# Patient Record
Sex: Male | Born: 1955 | Race: White | Hispanic: No | Smoking: Former smoker
Health system: Southern US, Community
[De-identification: ages and names within clinical notes are randomized; demographics above are authoritative.]

## PROBLEM LIST (undated history)

## (undated) DIAGNOSIS — E785 Hyperlipidemia, unspecified: Secondary | ICD-10-CM

## (undated) DIAGNOSIS — T8859XA Other complications of anesthesia, initial encounter: Secondary | ICD-10-CM

## (undated) DIAGNOSIS — I4891 Unspecified atrial fibrillation: Secondary | ICD-10-CM

## (undated) DIAGNOSIS — F419 Anxiety disorder, unspecified: Secondary | ICD-10-CM

## (undated) DIAGNOSIS — Z9989 Dependence on other enabling machines and devices: Secondary | ICD-10-CM

## (undated) DIAGNOSIS — S83209A Unspecified tear of unspecified meniscus, current injury, unspecified knee, initial encounter: Secondary | ICD-10-CM

## (undated) DIAGNOSIS — J439 Emphysema, unspecified: Secondary | ICD-10-CM

## (undated) DIAGNOSIS — G4733 Obstructive sleep apnea (adult) (pediatric): Secondary | ICD-10-CM

## (undated) DIAGNOSIS — K859 Acute pancreatitis without necrosis or infection, unspecified: Secondary | ICD-10-CM

## (undated) DIAGNOSIS — G5760 Lesion of plantar nerve, unspecified lower limb: Secondary | ICD-10-CM

## (undated) DIAGNOSIS — Z8709 Personal history of other diseases of the respiratory system: Secondary | ICD-10-CM

## (undated) DIAGNOSIS — M1990 Unspecified osteoarthritis, unspecified site: Secondary | ICD-10-CM

## (undated) DIAGNOSIS — K469 Unspecified abdominal hernia without obstruction or gangrene: Secondary | ICD-10-CM

## (undated) DIAGNOSIS — K219 Gastro-esophageal reflux disease without esophagitis: Secondary | ICD-10-CM

## (undated) DIAGNOSIS — F32A Depression, unspecified: Secondary | ICD-10-CM

## (undated) DIAGNOSIS — I10 Essential (primary) hypertension: Secondary | ICD-10-CM

## (undated) DIAGNOSIS — J45909 Unspecified asthma, uncomplicated: Secondary | ICD-10-CM

## (undated) DIAGNOSIS — Z87442 Personal history of urinary calculi: Secondary | ICD-10-CM

## (undated) DIAGNOSIS — F329 Major depressive disorder, single episode, unspecified: Secondary | ICD-10-CM

## (undated) HISTORY — DX: Unspecified asthma, uncomplicated: J45.909

## (undated) HISTORY — PX: HERNIA REPAIR: SHX51

## (undated) HISTORY — DX: Unspecified atrial fibrillation: I48.91

## (undated) HISTORY — DX: Obstructive sleep apnea (adult) (pediatric): Z99.89

## (undated) HISTORY — DX: Essential (primary) hypertension: I10

## (undated) HISTORY — DX: Anxiety disorder, unspecified: F41.9

## (undated) HISTORY — PX: COLONOSCOPY: SHX174

## (undated) HISTORY — DX: Unspecified osteoarthritis, unspecified site: M19.90

## (undated) HISTORY — DX: Emphysema, unspecified: J43.9

## (undated) HISTORY — DX: Personal history of other diseases of the respiratory system: Z87.09

## (undated) HISTORY — DX: Unspecified abdominal hernia without obstruction or gangrene: K46.9

## (undated) HISTORY — DX: Lesion of plantar nerve, unspecified lower limb: G57.60

## (undated) HISTORY — DX: Obstructive sleep apnea (adult) (pediatric): G47.33

## (undated) HISTORY — DX: Unspecified tear of unspecified meniscus, current injury, unspecified knee, initial encounter: S83.209A

## (undated) HISTORY — DX: Depression, unspecified: F32.A

## (undated) HISTORY — DX: Gastro-esophageal reflux disease without esophagitis: K21.9

## (undated) HISTORY — DX: Major depressive disorder, single episode, unspecified: F32.9

## (undated) HISTORY — DX: Hyperlipidemia, unspecified: E78.5

## (undated) HISTORY — DX: Acute pancreatitis without necrosis or infection, unspecified: K85.90

---

## 1989-04-10 HISTORY — PX: BONE CYST EXCISION: SHX376

## 1998-08-01 ENCOUNTER — Encounter: Payer: Self-pay | Admitting: Emergency Medicine

## 1998-08-01 ENCOUNTER — Emergency Department (HOSPITAL_COMMUNITY): Admission: EM | Admit: 1998-08-01 | Discharge: 1998-08-01 | Payer: Self-pay | Admitting: Emergency Medicine

## 2000-07-10 ENCOUNTER — Encounter: Payer: Self-pay | Admitting: Internal Medicine

## 2000-07-10 ENCOUNTER — Encounter: Admission: RE | Admit: 2000-07-10 | Discharge: 2000-07-10 | Payer: Self-pay | Admitting: Internal Medicine

## 2004-02-03 ENCOUNTER — Encounter: Admission: RE | Admit: 2004-02-03 | Discharge: 2004-02-03 | Payer: Self-pay | Admitting: Internal Medicine

## 2004-08-25 ENCOUNTER — Ambulatory Visit: Payer: Self-pay | Admitting: Pulmonary Disease

## 2008-09-01 ENCOUNTER — Encounter: Admission: RE | Admit: 2008-09-01 | Discharge: 2008-09-01 | Payer: Self-pay | Admitting: Internal Medicine

## 2011-03-31 ENCOUNTER — Encounter (INDEPENDENT_AMBULATORY_CARE_PROVIDER_SITE_OTHER): Payer: Self-pay | Admitting: Surgery

## 2011-04-05 ENCOUNTER — Ambulatory Visit (INDEPENDENT_AMBULATORY_CARE_PROVIDER_SITE_OTHER): Payer: 59 | Admitting: Surgery

## 2011-04-05 ENCOUNTER — Encounter (INDEPENDENT_AMBULATORY_CARE_PROVIDER_SITE_OTHER): Payer: Self-pay | Admitting: Surgery

## 2011-04-05 DIAGNOSIS — K4090 Unilateral inguinal hernia, without obstruction or gangrene, not specified as recurrent: Secondary | ICD-10-CM

## 2011-04-05 NOTE — Progress Notes (Signed)
Chief Complaint  Patient presents with  . New Evaluation    eval RIH - referral from Dr. Juline Patch    HISTORY: Patient is a 55 year old white male referred by his primary care physician with symptomatic right inguinal hernia. This has been present for several years. It was originally diagnosed by his urologist. Over time it has become larger and more symptomatic. Patient experiences pain with physical activity. He now notes a visible bulge. It has always been reducible. Patient has had no prior abdominal surgery.   Past Medical History  Diagnosis Date  . Obstructive sleep apnea on CPAP   . Pancreatitis   . Morton's neuroma   . Anxiety and depression   . GERD (gastroesophageal reflux disease)   . Torn meniscus     Left  . OA (osteoarthritis)     neck  . Hernia     right inguinal  . Hypertension   . History of bronchitis      Current Outpatient Prescriptions  Medication Sig Dispense Refill  . ASTAXANTHIN PO Take 6 mg by mouth daily.        Marland Kitchen HYDROcodone-acetaminophen (NORCO) 5-325 MG per tablet Take 1 tablet by mouth every 6 (six) hours as needed.        . Inositol 500 MG TABS Take by mouth.        . Kava, Piper methysticum, (KAVA KAVA PO) Take by mouth as directed.        . Multiple Vitamins-Minerals (MULTIVITAMIN WITH MINERALS) tablet Take 1 tablet by mouth daily.        Marland Kitchen omeprazole (PRILOSEC) 20 MG capsule Take 20 mg by mouth daily.        . sildenafil (VIAGRA) 100 MG tablet Take 100 mg by mouth daily as needed.        Marland Kitchen Specialty Vitamins Products (MAGNESIUM, AMINO ACID CHELATE,) 133 MG tablet Take 1 tablet by mouth 2 (two) times daily.        . vitamin B-12 (CYANOCOBALAMIN) 100 MCG tablet Take 50 mcg by mouth daily.        Marland Kitchen VITAMIN D, CHOLECALCIFEROL, PO Take 4,000 Units by mouth daily.           Allergies  Allergen Reactions  . Aspirin     Upsets GI  . Nsaids     Upsets GI     Family History  Problem Relation Age of Onset  . Dementia    . Hypertension  Mother   . Hypertension Sister   . Hypertension Brother      History   Social History  . Marital Status: Divorced    Spouse Name: N/A    Number of Children: N/A  . Years of Education: N/A   Social History Main Topics  . Smoking status: Current Everyday Smoker -- 1.5 packs/day  . Smokeless tobacco: None  . Alcohol Use: Yes  . Drug Use: No  . Sexually Active:    Other Topics Concern  . None   Social History Narrative  . None     REVIEW OF SYSTEMS - PERTINENT POSITIVES ONLY: No signs or symptoms of obstruction. It is reducible. Painful with physical activity. Painful with cough.   EXAM: Filed Vitals:   04/05/11 0846  BP: 168/102  Pulse: 104  Temp: 99.2 F (37.3 C)  Resp: 12    HEENT: normocephalic; pupils equal and reactive; sclerae clear; dentition good; mucous membranes moist NECK:  No mass or nodules; symmetric on extension; no palpable anterior or  posterior cervical lymphadenopathy; no supraclavicular masses; no tenderness CHEST: clear to auscultation bilaterally without rales, rhonchi, or wheezes CARDIAC: regular rate and rhythm without significant murmur; peripheral pulses are full ABD:  No surgical wounds; no umbilical hernia GU:  Normal male; no mass; obvious bulge right groin; no hernia on left with cough; tender hernia on right which augments with cough and valsalva EXT:  non-tender without edema; no deformity NEURO: no gross focal deficits; no sign of tremor   LABORATORY RESULTS: See E-Chart for most recent results   RADIOLOGY RESULTS: See E-Chart or I-Site for most recent results   IMPRESSION: Right inguinal hernia, reducible, symptomatic   PLAN: The patient and I had a lengthy discussion regarding right inguinal hernia repair with mesh. We discussed the benefits of open repair versus laparoscopic repair. I provided him with written literature regarding hernias. We discussed outpatient surgery. We discussed the restrictions on his activities  following the procedure. We discussed potential complications. He understands and wishes to proceed. We will make arrangements for surgery in the near future at a time convenient for the patient.  The risks and benefits of the procedure have been discussed at length with the patient.  The patient understands the proposed procedure, potential alternative treatments, and the course of recovery to be expected.  All of the patient's questions have been answered at this time.  The patient wishes to proceed with surgery and will schedule a date for their procedure through our office staff.  Velora Heckler, MD, FACS General & Endocrine Surgery [facility]  Visit Diagnoses: 1. Inguinal hernia unilateral, non-recurrent, right    Primary Care Physician: Juline Patch, MD, MD

## 2011-04-05 NOTE — Patient Instructions (Signed)

## 2011-04-14 DIAGNOSIS — K4090 Unilateral inguinal hernia, without obstruction or gangrene, not specified as recurrent: Secondary | ICD-10-CM

## 2011-04-14 HISTORY — PX: HERNIA REPAIR: SHX51

## 2011-04-17 ENCOUNTER — Telehealth (INDEPENDENT_AMBULATORY_CARE_PROVIDER_SITE_OTHER): Payer: Self-pay | Admitting: General Surgery

## 2011-04-17 NOTE — Telephone Encounter (Signed)
Pt called and stated that the Percocet was making him sick that it was to strong. I called in the Vicodin pro call into the [facility] [phone]

## 2011-04-18 ENCOUNTER — Telehealth (INDEPENDENT_AMBULATORY_CARE_PROVIDER_SITE_OTHER): Payer: Self-pay | Admitting: Surgery

## 2011-04-26 ENCOUNTER — Telehealth (INDEPENDENT_AMBULATORY_CARE_PROVIDER_SITE_OTHER): Payer: Self-pay

## 2011-04-26 NOTE — Telephone Encounter (Signed)
Pt called stating he was having tingling around his incision. He wants to know if this is normal. No swelling,redness or drainage. Pt states wound is closed and appears to be healing well. Pt advised due to  incision there may be some stinging and tingling due to nerves that are involved in the surgery. Pt advised over time this should resolve. Pt to call if incision shows any signs of infection or if symptom increases or becomes more painful.

## 2011-05-03 ENCOUNTER — Ambulatory Visit (INDEPENDENT_AMBULATORY_CARE_PROVIDER_SITE_OTHER): Payer: Self-pay | Admitting: Surgery

## 2011-05-04 ENCOUNTER — Encounter (INDEPENDENT_AMBULATORY_CARE_PROVIDER_SITE_OTHER): Payer: Self-pay | Admitting: Surgery

## 2011-05-04 ENCOUNTER — Ambulatory Visit (INDEPENDENT_AMBULATORY_CARE_PROVIDER_SITE_OTHER): Payer: 59 | Admitting: Surgery

## 2011-05-04 VITALS — BP 142/98 | HR 70 | Temp 97.6°F | Resp 16 | Ht 71.5 in | Wt 176.2 lb

## 2011-05-04 DIAGNOSIS — K4090 Unilateral inguinal hernia, without obstruction or gangrene, not specified as recurrent: Secondary | ICD-10-CM

## 2011-05-04 NOTE — Patient Instructions (Signed)
  COCOA BUTTER & VITAMIN E CREAM  (Palmer's or other brand)  Apply cocoa butter/vitamin E cream to your incision 2 - 3 times daily.  Massage cream into incision for one minute with each application.  Use sunscreen (50 SPF or higher) for first 6 months after surgery if area is exposed to sun.  You may substitute Mederma or other scar reducing creams as desired.   

## 2011-05-04 NOTE — Progress Notes (Signed)
Visit Diagnoses: 1. Inguinal hernia unilateral, non-recurrent, right    HISTORY: Patient returns for her first postoperative visit having undergone right inguinal hernia repair with mesh on April 14, 2011. Postoperatively the patient has had moderate pain in the distribution of the ilioinguinal nerve. He presents today for evaluation.  EXAM: Skin incision is well-healed. Mild to moderate soft tissue swelling. No sign of stroma. No sign of hematoma. No sign of infection. With cough and Valsalva there is no sign of recurrent hernia.  IMPRESSION: Status post right inguinal hernia repair with mesh, postoperative pain in the distribution of the ilioinguinal nerve  PLAN: The patient will continue to be careful with lifting. He is limited to 25 pounds. He will use ice packs in the evening and elevate his legs. He will return to see me for a wound check in 4-5 weeks.  Velora Heckler, MD, FACS General & Endocrine Surgery [facility]

## 2011-05-05 ENCOUNTER — Encounter (INDEPENDENT_AMBULATORY_CARE_PROVIDER_SITE_OTHER): Payer: 59 | Admitting: Surgery

## 2011-05-09 ENCOUNTER — Encounter (INDEPENDENT_AMBULATORY_CARE_PROVIDER_SITE_OTHER): Payer: 59 | Admitting: Surgery

## 2011-05-10 ENCOUNTER — Telehealth (INDEPENDENT_AMBULATORY_CARE_PROVIDER_SITE_OTHER): Payer: Self-pay

## 2011-05-10 NOTE — Telephone Encounter (Signed)
[facility] called requesting authorization for a refill of Norco 5/325mg , 1 po  q6hrs prn pain, #30 w/0 refill, post surgical pain medication refill protocol.

## 2011-05-11 ENCOUNTER — Encounter (INDEPENDENT_AMBULATORY_CARE_PROVIDER_SITE_OTHER): Payer: 59 | Admitting: Surgery

## 2011-06-01 ENCOUNTER — Encounter (INDEPENDENT_AMBULATORY_CARE_PROVIDER_SITE_OTHER): Payer: Self-pay | Admitting: Surgery

## 2011-06-01 ENCOUNTER — Ambulatory Visit (INDEPENDENT_AMBULATORY_CARE_PROVIDER_SITE_OTHER): Payer: 59 | Admitting: Surgery

## 2011-06-01 VITALS — BP 132/90 | HR 100 | Temp 97.4°F | Resp 16 | Wt 179.2 lb

## 2011-06-01 DIAGNOSIS — K4090 Unilateral inguinal hernia, without obstruction or gangrene, not specified as recurrent: Secondary | ICD-10-CM

## 2011-06-01 NOTE — Patient Instructions (Signed)
  COCOA BUTTER & VITAMIN E CREAM  (Palmer's or other brand)  Apply cocoa butter/vitamin E cream to your incision 2 - 3 times daily.  Massage cream into incision for one minute with each application.  Use sunscreen (50 SPF or higher) for first 6 months after surgery if area is exposed to sun.  You may substitute Mederma or other scar reducing creams as desired.   

## 2011-06-01 NOTE — Progress Notes (Signed)
Visit Diagnoses: 1. Inguinal hernia unilateral, non-recurrent, right     HISTORY: Patient returns for post op visit.  Pain has completely resolved.  EXAM: Wound is well healed.  Minimal STS.  No sign of recurrence.  IMPRESSION: Status post RIH with mesh  PLAN: Resume normal activities.  Return as needed.  Velora Heckler, MD, FACS General & Endocrine Surgery [facility]

## 2011-06-09 ENCOUNTER — Encounter (INDEPENDENT_AMBULATORY_CARE_PROVIDER_SITE_OTHER): Payer: Self-pay | Admitting: Surgery

## 2014-02-09 ENCOUNTER — Other Ambulatory Visit: Payer: Self-pay | Admitting: Internal Medicine

## 2014-02-09 DIAGNOSIS — R1310 Dysphagia, unspecified: Secondary | ICD-10-CM

## 2014-02-16 ENCOUNTER — Ambulatory Visit
Admission: RE | Admit: 2014-02-16 | Discharge: 2014-02-16 | Disposition: A | Discharge status: Home / Self Care | Payer: 59 | Source: Ambulatory Visit | Attending: Internal Medicine | Admitting: Internal Medicine

## 2014-02-16 DIAGNOSIS — R1310 Dysphagia, unspecified: Secondary | ICD-10-CM

## 2015-10-18 DIAGNOSIS — M5416 Radiculopathy, lumbar region: Secondary | ICD-10-CM | POA: Diagnosis not present

## 2016-02-14 DIAGNOSIS — Z0000 Encounter for general adult medical examination without abnormal findings: Secondary | ICD-10-CM | POA: Diagnosis not present

## 2016-02-14 DIAGNOSIS — Z125 Encounter for screening for malignant neoplasm of prostate: Secondary | ICD-10-CM | POA: Diagnosis not present

## 2016-03-20 DIAGNOSIS — Z1212 Encounter for screening for malignant neoplasm of rectum: Secondary | ICD-10-CM | POA: Diagnosis not present

## 2016-04-14 DIAGNOSIS — M5416 Radiculopathy, lumbar region: Secondary | ICD-10-CM | POA: Diagnosis not present

## 2016-04-14 DIAGNOSIS — M7062 Trochanteric bursitis, left hip: Secondary | ICD-10-CM | POA: Diagnosis not present

## 2016-04-17 DIAGNOSIS — N432 Other hydrocele: Secondary | ICD-10-CM | POA: Diagnosis not present

## 2016-04-17 DIAGNOSIS — N503 Cyst of epididymis: Secondary | ICD-10-CM | POA: Diagnosis not present

## 2016-05-01 DIAGNOSIS — M6281 Muscle weakness (generalized): Secondary | ICD-10-CM | POA: Diagnosis not present

## 2016-05-01 DIAGNOSIS — R278 Other lack of coordination: Secondary | ICD-10-CM | POA: Diagnosis not present

## 2016-05-01 DIAGNOSIS — R1031 Right lower quadrant pain: Secondary | ICD-10-CM | POA: Diagnosis not present

## 2016-05-01 DIAGNOSIS — M62838 Other muscle spasm: Secondary | ICD-10-CM | POA: Diagnosis not present

## 2016-05-02 DIAGNOSIS — M5416 Radiculopathy, lumbar region: Secondary | ICD-10-CM | POA: Diagnosis not present

## 2016-05-09 DIAGNOSIS — M545 Low back pain: Secondary | ICD-10-CM | POA: Diagnosis not present

## 2016-05-09 DIAGNOSIS — M62838 Other muscle spasm: Secondary | ICD-10-CM | POA: Diagnosis not present

## 2016-05-09 DIAGNOSIS — M6281 Muscle weakness (generalized): Secondary | ICD-10-CM | POA: Diagnosis not present

## 2016-05-09 DIAGNOSIS — R3912 Poor urinary stream: Secondary | ICD-10-CM | POA: Diagnosis not present

## 2016-05-16 DIAGNOSIS — R3915 Urgency of urination: Secondary | ICD-10-CM | POA: Diagnosis not present

## 2016-05-16 DIAGNOSIS — M6281 Muscle weakness (generalized): Secondary | ICD-10-CM | POA: Diagnosis not present

## 2016-05-16 DIAGNOSIS — R3912 Poor urinary stream: Secondary | ICD-10-CM | POA: Diagnosis not present

## 2016-05-16 DIAGNOSIS — M62838 Other muscle spasm: Secondary | ICD-10-CM | POA: Diagnosis not present

## 2016-07-24 DIAGNOSIS — M4722 Other spondylosis with radiculopathy, cervical region: Secondary | ICD-10-CM | POA: Diagnosis not present

## 2016-07-24 DIAGNOSIS — M65342 Trigger finger, left ring finger: Secondary | ICD-10-CM | POA: Diagnosis not present

## 2016-09-11 DIAGNOSIS — Z6828 Body mass index (BMI) 28.0-28.9, adult: Secondary | ICD-10-CM | POA: Diagnosis not present

## 2016-09-11 DIAGNOSIS — M519 Unspecified thoracic, thoracolumbar and lumbosacral intervertebral disc disorder: Secondary | ICD-10-CM | POA: Diagnosis not present

## 2016-10-19 DIAGNOSIS — M4120 Other idiopathic scoliosis, site unspecified: Secondary | ICD-10-CM | POA: Diagnosis not present

## 2016-10-19 DIAGNOSIS — M545 Low back pain: Secondary | ICD-10-CM | POA: Diagnosis not present

## 2016-10-19 DIAGNOSIS — M542 Cervicalgia: Secondary | ICD-10-CM | POA: Diagnosis not present

## 2016-10-31 DIAGNOSIS — M65342 Trigger finger, left ring finger: Secondary | ICD-10-CM | POA: Diagnosis not present

## 2016-10-31 DIAGNOSIS — M65341 Trigger finger, right ring finger: Secondary | ICD-10-CM | POA: Diagnosis not present

## 2016-11-22 ENCOUNTER — Emergency Department (HOSPITAL_COMMUNITY)
Admission: EM | Admit: 2016-11-22 | Discharge: 2016-11-23 | Disposition: A | Discharge status: Home / Self Care | Payer: BLUE CROSS/BLUE SHIELD | Attending: Emergency Medicine | Admitting: Emergency Medicine

## 2016-11-22 ENCOUNTER — Encounter (HOSPITAL_COMMUNITY): Payer: Self-pay | Admitting: Emergency Medicine

## 2016-11-22 DIAGNOSIS — F329 Major depressive disorder, single episode, unspecified: Secondary | ICD-10-CM | POA: Insufficient documentation

## 2016-11-22 DIAGNOSIS — Z79899 Other long term (current) drug therapy: Secondary | ICD-10-CM | POA: Diagnosis not present

## 2016-11-22 DIAGNOSIS — F17200 Nicotine dependence, unspecified, uncomplicated: Secondary | ICD-10-CM | POA: Insufficient documentation

## 2016-11-22 DIAGNOSIS — I480 Paroxysmal atrial fibrillation: Secondary | ICD-10-CM

## 2016-11-22 DIAGNOSIS — R079 Chest pain, unspecified: Secondary | ICD-10-CM | POA: Diagnosis not present

## 2016-11-22 DIAGNOSIS — F419 Anxiety disorder, unspecified: Secondary | ICD-10-CM | POA: Diagnosis not present

## 2016-11-22 DIAGNOSIS — R000 Tachycardia, unspecified: Secondary | ICD-10-CM | POA: Diagnosis not present

## 2016-11-22 DIAGNOSIS — I10 Essential (primary) hypertension: Secondary | ICD-10-CM | POA: Diagnosis not present

## 2016-11-22 NOTE — ED Triage Notes (Signed)
Per EMS pt from home pulse 190 total 6mg  then 12 mg adenosine, pt vagaled with no improvement, pt still 190, 1 hr ago pt was sitting on couch and pt felt heaviness in chest and felt heart racing,

## 2016-11-23 LAB — CBC WITH DIFFERENTIAL/PLATELET
BASOS ABS: 0 10*3/uL (ref 0.0–0.1)
BASOS PCT: 0 %
EOS PCT: 4 %
Eosinophils Absolute: 0.3 10*3/uL (ref 0.0–0.7)
HEMATOCRIT: 41.4 % (ref 39.0–52.0)
Hemoglobin: 13.9 g/dL (ref 13.0–17.0)
Lymphocytes Relative: 27 %
Lymphs Abs: 1.9 10*3/uL (ref 0.7–4.0)
MCH: 30.1 pg (ref 26.0–34.0)
MCHC: 33.6 g/dL (ref 30.0–36.0)
MCV: 89.6 fL (ref 78.0–100.0)
MONO ABS: 1 10*3/uL (ref 0.1–1.0)
MONOS PCT: 14 %
NEUTROS ABS: 3.9 10*3/uL (ref 1.7–7.7)
Neutrophils Relative %: 55 %
Platelets: 185 10*3/uL (ref 150–400)
RBC: 4.62 MIL/uL (ref 4.22–5.81)
RDW: 12.6 % (ref 11.5–15.5)
WBC: 7 10*3/uL (ref 4.0–10.5)

## 2016-11-23 LAB — BASIC METABOLIC PANEL
Anion gap: 9 (ref 5–15)
BUN: 17 mg/dL (ref 6–20)
CALCIUM: 9 mg/dL (ref 8.9–10.3)
CO2: 26 mmol/L (ref 22–32)
CREATININE: 0.95 mg/dL (ref 0.61–1.24)
Chloride: 108 mmol/L (ref 101–111)
GFR calc Af Amer: >60 mL/min (ref >60)
GLUCOSE: 106 mg/dL — AB (ref 65–99)
Potassium: 4.4 mmol/L (ref 3.5–5.1)
Sodium: 143 mmol/L (ref 135–145)

## 2016-11-23 LAB — TSH: TSH: 2.375 u[IU]/mL (ref 0.350–4.500)

## 2016-11-23 LAB — T4, FREE: Free T4: 0.86 ng/dL (ref 0.61–1.12)

## 2016-11-23 MED ORDER — APIXABAN 5 MG PO TABS
5.0000 mg | ORAL_TABLET | Freq: Two times a day (BID) | ORAL | Status: DC
  Filled 2016-11-23: qty 1

## 2016-11-23 MED ORDER — PROPOFOL 10 MG/ML IV BOLUS
0.5000 mg/kg | Freq: Once | INTRAVENOUS | Status: AC
  Administered 2016-11-23: 40.8 mg via INTRAVENOUS
  Filled 2016-11-23: qty 20

## 2016-11-23 MED ORDER — APIXABAN 5 MG PO TABS: 5.0000 mg | ORAL_TABLET | Freq: Two times a day (BID) | ORAL | 0 refills | Status: DC

## 2016-11-23 NOTE — ED Notes (Signed)
Pt stable and states understanding discharge instructions, downtime in process so no signature, pt given eliquis at 0130

## 2016-11-23 NOTE — Sedation Documentation (Signed)
120J shock delivered

## 2016-11-23 NOTE — ED Provider Notes (Addendum)
[facility] DEPT Provider Note   CSN: [id number] Arrival date & time: 11/22/16  2345     History   Chief Complaint Chief Complaint  Patient presents with  . Chest Pain    HPI John [name] is a 61 y.o. male.  The history is provided by the patient.  he had sudden onset about one hour ago of feeling like he had some indigestion, and felt like his heart was racing. He tried taking some antacids without relief. There is no associated dyspnea or nausea or diaphoresis. He has no prior cardiac history. He does have a history of hypertension, and he does vape with a low nicotine solution. No history of diabetes or hyperlipidemia. He came by ambulance who noted rapid heart rate and gave him 2 doses of adenosine without relief.  Past Medical History:  Diagnosis Date  . Anxiety and depression   . GERD (gastroesophageal reflux disease)   . Hernia    right inguinal  . History of bronchitis   . Hypertension   . Morton's neuroma   . OA (osteoarthritis)    neck  . Obstructive sleep apnea on CPAP   . Pancreatitis   . Torn meniscus    Left    Patient Active Problem List   Diagnosis Date Noted  . Inguinal hernia unilateral, non-recurrent, right 04/05/2011    Past Surgical History:  Procedure Laterality Date  . BONE CYST EXCISION  1991   left wrist  . COLONOSCOPY    . HERNIA REPAIR  04/14/11   right inguinal       Home Medications    Prior to Admission medications   Medication Sig Start Date End Date Taking? Authorizing Provider  ASTAXANTHIN PO Take 6 mg by mouth daily.      [provider]  HYDROcodone-acetaminophen (NORCO) 5-325 MG per tablet Take 1 tablet by mouth every 6 (six) hours as needed.      [provider]  Inositol 500 MG TABS Take by mouth.      [provider]  Kava, Piper methysticum, (KAVA KAVA PO) Take by mouth as directed.      [provider]  Multiple Vitamins-Minerals (MULTIVITAMIN WITH MINERALS) tablet Take 1  tablet by mouth daily.      [provider]  omeprazole (PRILOSEC) 20 MG capsule Take 20 mg by mouth daily.      [provider]  sildenafil (VIAGRA) 100 MG tablet Take 100 mg by mouth daily as needed.      [provider]  Specialty Vitamins Products (MAGNESIUM, AMINO ACID CHELATE,) 133 MG tablet Take 1 tablet by mouth 2 (two) times daily.      [provider]  vitamin B-12 (CYANOCOBALAMIN) 100 MCG tablet Take 50 mcg by mouth daily.      [provider]  VITAMIN D, CHOLECALCIFEROL, PO Take 4,000 Units by mouth daily.      [provider]    Family History Family History  Problem Relation Age of Onset  . Hypertension Mother   . Dementia Unknown   . Hypertension Sister   . Hypertension Brother     Social History Social History  Substance Use Topics  . Smoking status: Current Every Day Smoker    Packs/day: 1.50  . Smokeless tobacco: Never Used  . Alcohol use Yes     Allergies   Aspirin and Nsaids   Review of Systems Review of Systems  All other systems reviewed and are negative.  Physical Exam Updated Vital Signs BP 129/81 (BP Location: Right Arm)   Pulse (!) 174   Temp 99.8 F (37.7 C) (Oral)   Resp 10   Ht 6' (1.829 m)   Wt 81.6 kg (180 lb)   SpO2 98%   BMI 24.41 kg/m   Physical Exam  Nursing note and vitals reviewed.  61 year old male, resting comfortably and in no acute distress. Vital signs are significant for tachycardia. Oxygen saturation is 98%, which is normal. Head is normocephalic and atraumatic. PERRLA, EOMI. Oropharynx is clear. Neck is nontender and supple without adenopathy or JVD. Back is nontender and there is no CVA tenderness. Lungs are clear without rales, wheezes, or rhonchi. Chest is nontender. Heart is tachycardic and irregular without murmur. Abdomen is soft, flat, nontender without masses or hepatosplenomegaly and peristalsis is normoactive. Extremities have no cyanosis or  edema, full range of motion is present. Skin is warm and dry without rash. Neurologic: Mental status is normal, cranial nerves are intact, there are no motor or sensory deficits.  ED Treatments / Results  Labs (all labs ordered are listed, but only abnormal results are displayed) Labs Reviewed  BASIC METABOLIC PANEL - Abnormal; Notable for the following:       Result Value   Glucose, Bld 106 (*)    All other components within normal limits  CBC WITH DIFFERENTIAL/PLATELET  TSH  T4, FREE    EKG  EKG Interpretation  Date/Time:  Wednesday November 22 2016 23:50:07 EDT Ventricular Rate:  177 PR Interval:    QRS Duration: 89 QT Interval:  272 QTC Calculation: 467 R Axis:   72 Text Interpretation:  Atrial fibrillation with rapid V-rate Abnormal R-wave progression, early transition Minimal ST depression, inferior leads - probably rate-related No old tracing to compare Confirmed by Delora Fuel ([id number]) on 11/23/2016 12:03:45 AM       EKG Interpretation  Date/Time:  Thursday November 23 2016 00:37:23 EDT Ventricular Rate:  110 PR Interval:    QRS Duration: 81 QT Interval:  315 QTC Calculation: 427 R Axis:   72 Text Interpretation:  Sinus tachycardia When compared with ECG of 11/22/2016, Sinus tachycardia has replaced Atrial fibrillation with rapid ventricular response ST depression in Inferior leads has resolved Confirmed by Delora Fuel ([id number]) on 11/23/2016 12:40:54 AM       Radiology No results found.  Procedures .Cardioversion Date/Time: 11/23/2016 12:36 AM Performed by: Delora Fuel Authorized by: Roxanne Mins, Wali Reinheimer   Consent:    Consent obtained:  Written   Consent given by:  Patient   Risks discussed:  Cutaneous burn, death, induced arrhythmia and pain   Alternatives discussed:  Rate-control medication and alternative treatment Pre-procedure details:    Cardioversion basis:  Emergent   Rhythm:  Atrial fibrillation   Electrode placement:  Anterior-posterior Attempt one:      Cardioversion mode:  Synchronous   Waveform:  Biphasic   Shock (Joules):  120   Shock outcome:  Conversion to normal sinus rhythm Post-procedure details:    Patient status:  Awake   Patient tolerance of procedure:  Tolerated well, no immediate complications   (including critical care time) Procedural sedation Performed by: IRSWN,IOEVO Consent: Verbal consent obtained. Risks and benefits: risks, benefits and alternatives were discussed Required items: required blood products, implants, devices, and special equipment available Patient identity confirmed: arm band and provided demographic data Time out: Immediately prior to procedure a "time out" was called to verify the correct patient, procedure, equipment, support staff and site/side marked  as required.  Sedation type: moderate (conscious) sedation NPO time confirmed and considedered  Sedatives: PROPOFOL  Physician Time at Bedside: 30 minutes  Vitals: Vital signs were monitored during sedation. Cardiac Monitor, pulse oximeter Patient tolerance: Patient tolerated the procedure well with no immediate complications. Comments: Pt with uneventful recovered. Returned to pre-procedural sedation baseline  CRITICAL CARE Performed by: Delora Fuel Total critical care time: 35 minutes Critical care time was exclusive of separately billable procedures and treating other patients. Critical care was necessary to treat or prevent imminent or life-threatening deterioration. Critical care was time spent personally by me on the following activities: development of treatment plan with patient and/or surrogate as well as nursing, discussions with consultants, evaluation of patient's response to treatment, examination of patient, obtaining history from patient or surrogate, ordering and performing treatments and interventions, ordering and review of laboratory studies, ordering and review of radiographic studies, pulse oximetry and re-evaluation of  patient's condition.   Medications Ordered in ED Medications  propofol (DIPRIVAN) 10 mg/mL bolus/IV push 40.8 mg (40.8 mg Intravenous Given 11/23/16 0026)     Initial Impression / Assessment and Plan / ED Course  I have reviewed the triage vital signs and the nursing notes.  Pertinent labs & imaging results that were available during my care of the patient were reviewed by me and considered in my medical decision making (see chart for details).  Atrial fibrillation with rapid ventricular response. Old records reviewed, and he has no relevant past visits. Patient was offered the option of electrical versus chemical cardioversion. He has chosen Dealer cardioversion.  He was successfully cardioverted. Screening labs are obtained.  Laboratory workup is unremarkable including TSH. He is discharged with prescription for apixaban, and is referred to the atrial fibrillation clinic.  CHA2DS2/VAS Stroke Risk Score = 1 (1 point for hypertension)  Final Clinical Impressions(s) / ED Diagnoses   Final diagnoses:  Paroxysmal atrial fibrillation [facility])    New Prescriptions Discharge Medication List as of 11/23/2016 12:44 AM    START taking these medications   Details  apixaban (ELIQUIS) 5 MG TABS tablet Take 1 tablet (5 mg total) by mouth 2 (two) times daily., Starting Thu 11/23/2016, Print         Delora Fuel, MD 26/33/35 [id number]    Delora Fuel, MD 56/38/93 [phone]

## 2016-11-27 ENCOUNTER — Encounter (HOSPITAL_COMMUNITY): Payer: Self-pay | Admitting: Nurse Practitioner

## 2016-11-27 ENCOUNTER — Ambulatory Visit (HOSPITAL_COMMUNITY)
Admission: RE | Admit: 2016-11-27 | Discharge: 2016-11-27 | Disposition: A | Discharge status: Home / Self Care | Payer: BLUE CROSS/BLUE SHIELD | Source: Ambulatory Visit | Attending: Nurse Practitioner | Admitting: Nurse Practitioner

## 2016-11-27 VITALS — BP 148/94 | HR 87 | Ht 72.0 in | Wt 199.0 lb

## 2016-11-27 DIAGNOSIS — I491 Atrial premature depolarization: Secondary | ICD-10-CM | POA: Insufficient documentation

## 2016-11-27 DIAGNOSIS — I10 Essential (primary) hypertension: Secondary | ICD-10-CM | POA: Diagnosis not present

## 2016-11-27 DIAGNOSIS — I480 Paroxysmal atrial fibrillation: Secondary | ICD-10-CM

## 2016-11-27 DIAGNOSIS — M1990 Unspecified osteoarthritis, unspecified site: Secondary | ICD-10-CM | POA: Diagnosis not present

## 2016-11-27 DIAGNOSIS — F17210 Nicotine dependence, cigarettes, uncomplicated: Secondary | ICD-10-CM | POA: Insufficient documentation

## 2016-11-27 DIAGNOSIS — K219 Gastro-esophageal reflux disease without esophagitis: Secondary | ICD-10-CM | POA: Diagnosis not present

## 2016-11-27 DIAGNOSIS — F329 Major depressive disorder, single episode, unspecified: Secondary | ICD-10-CM | POA: Insufficient documentation

## 2016-11-27 DIAGNOSIS — K859 Acute pancreatitis without necrosis or infection, unspecified: Secondary | ICD-10-CM | POA: Diagnosis not present

## 2016-11-27 DIAGNOSIS — I4891 Unspecified atrial fibrillation: Secondary | ICD-10-CM | POA: Diagnosis not present

## 2016-11-27 DIAGNOSIS — G4733 Obstructive sleep apnea (adult) (pediatric): Secondary | ICD-10-CM | POA: Insufficient documentation

## 2016-11-27 DIAGNOSIS — F419 Anxiety disorder, unspecified: Secondary | ICD-10-CM | POA: Insufficient documentation

## 2016-11-27 MED ORDER — DILTIAZEM HCL 30 MG PO TABS: ORAL_TABLET | ORAL | 0 refills | Status: DC

## 2016-11-27 NOTE — Progress Notes (Signed)
Primary Care Physician: Tommy Medal, MD Referring Physician: [facility]   John [name] is a 61 y.o. male with a h/o anxiety and depression, HTN, obstructive sleep apnea, with new onset afib treated in [facility] ER 8/15, with new onset afib at 177 bpm. He had two doses of adenosine in EMS which did not change rhythm.   He did have successful cardioversion. Doesn't think he has had this before. He was drinking a lot of caffeine drinks,before ER visit, vapes with low nicotine option, is "addicted to Aftrin nose spray" and because of this may get some systemic effects from decongestants, has OSA but does not use cpap as he felt worsened his nasal congestion. May have had echo stress test in 2015 but can not find in Epic. He works in yard care, owns his own business.  He has chronic back issues with herniated disc in 2014. He has to take meloxicam 15 mg daily as well as ultram daily to function in his physical job.He is concerned that he is now on eliquis 5 mg bid per cardioversion protocol. He has a chadsvasc score of 1,HTN, so he will not need anticoagulation long term by guidelines. He also feels his BP has not been as well controlled since switching from valsartan to olmesartan a few weeks ago.  Today, he denies symptoms of palpitations, chest pain, shortness of breath, orthopnea, PND, lower extremity edema, dizziness, presyncope, syncope, or neurologic sequela. The patient is tolerating medications without difficulties and is otherwise without complaint today.   Past Medical History:  Diagnosis Date  . Anxiety and depression   . GERD (gastroesophageal reflux disease)   . Hernia    right inguinal  . History of bronchitis   . Hypertension   . Morton's neuroma   . OA (osteoarthritis)    neck  . Obstructive sleep apnea on CPAP   . Pancreatitis   . Torn meniscus    Left   Past Surgical History:  Procedure Laterality Date  . BONE CYST EXCISION  1991   left wrist  . COLONOSCOPY    . HERNIA  REPAIR  04/14/11   right inguinal    Current Outpatient Prescriptions  Medication Sig Dispense Refill  . apixaban (ELIQUIS) 5 MG TABS tablet Take 1 tablet (5 mg total) by mouth 2 (two) times daily. 60 tablet 0  . ASTAXANTHIN PO Take 6 mg by mouth 3 (three) times a week.     . clonazePAM (KLONOPIN) 2 MG tablet Take 2 mg by mouth 2 (two) times daily as needed for anxiety.    Marland Kitchen esomeprazole (NEXIUM) 20 MG capsule Take 20 mg by mouth daily at 12 noon.    . meloxicam (MOBIC) 15 MG tablet TAKE 1 TABLET BY MOUTH EVERY DAY    . methocarbamol (ROBAXIN) 500 MG tablet Take 500 mg by mouth every 6 (six) hours as needed for muscle spasms.    . mometasone (ASMANEX) 220 MCG/INH inhaler Inhale 1 puff into the lungs 2 (two) times daily.    . montelukast (SINGULAIR) 10 MG tablet Take 10 mg by mouth every morning.    . Multiple Vitamins-Minerals (MULTIVITAMIN WITH MINERALS) tablet Take 1 tablet by mouth daily.      Marland Kitchen olmesartan (BENICAR) 40 MG tablet Take 40 mg by mouth daily.    . Omega-3 Fatty Acids (FISH OIL) 1200 MG CAPS Take by mouth.    . sildenafil (VIAGRA) 100 MG tablet Take 100 mg by mouth daily as needed for erectile dysfunction.     Marland Kitchen  Specialty Vitamins Products (MAGNESIUM, AMINO ACID CHELATE,) 133 MG tablet Take 1 tablet by mouth 2 (two) times a week.     . traMADol (ULTRAM) 50 MG tablet Take 25-50 mg by mouth every morning.    . traMADol (ULTRAM) 50 MG tablet TAKE 1 TABLET BY MOUTH TWICE A DAY    . vitamin B-12 (CYANOCOBALAMIN) 100 MCG tablet Take 50 mcg by mouth daily.      Marland Kitchen albuterol (PROVENTIL HFA;VENTOLIN HFA) 108 (90 Base) MCG/ACT inhaler Inhale 1-2 puffs into the lungs every 6 (six) hours as needed for wheezing or shortness of breath.    . diltiazem (CARDIZEM) 30 MG tablet Take one tablet by mouth every 4 hours AS NEEDED for A-fib HR > 100 as long as BP > 100. 30 tablet 0   No current facility-administered medications for this encounter.     No Active Allergies  Social History   Social  History  . Marital status: Divorced    Spouse name: N/A  . Number of children: N/A  . Years of education: N/A   Occupational History  . Not on file.   Social History Main Topics  . Smoking status: Current Every Day Smoker    Packs/day: 1.50  . Smokeless tobacco: Never Used  . Alcohol use Yes  . Drug use: No  . Sexual activity: Not on file   Other Topics Concern  . Not on file   Social History Narrative  . No narrative on file    Family History  Problem Relation Age of Onset  . Hypertension Mother   . Dementia Unknown   . Hypertension Sister   . Hypertension Brother     ROS- All systems are reviewed and negative except as per the HPI above  Physical Exam: Vitals:   11/27/16 0923  BP: (!) 148/94  Pulse: 87  Weight: 199 lb (90.3 kg)  Height: 6' (1.829 m)   Wt Readings from Last 3 Encounters:  11/27/16 199 lb (90.3 kg)  11/22/16 180 lb (81.6 kg)  06/01/11 179 lb 3.2 oz (81.3 kg)    Labs: Lab Results  Component Value Date   NA 143 11/23/2016   K 4.4 11/23/2016   CL 108 11/23/2016   CO2 26 11/23/2016   GLUCOSE 106 (H) 11/23/2016   BUN 17 11/23/2016   CREATININE 0.95 11/23/2016   CALCIUM 9.0 11/23/2016   No results found for: INR No results found for: CHOL, HDL, LDLCALC, TRIG   GEN- The patient is well appearing, alert and oriented x 3 today.   Head- normocephalic, atraumatic Eyes-  Sclera clear, conjunctiva pink Ears- hearing intact Oropharynx- clear Neck- supple, no JVP Lymph- no cervical lymphadenopathy Lungs- Clear to ausculation bilaterally, normal work of breathing Heart- Regular rate and rhythm, no murmurs, rubs or gallops, PMI not laterally displaced GI- soft, NT, ND, + BS Extremities- no clubbing, cyanosis, or edema MS- no significant deformity or atrophy Skin- no rash or lesion Psych- euthymic mood, full affect Neuro- strength and sensation are intact  EKG- SR with PAC's, pr int 132 ms, qrs int 72 ms, qtc 416 ms Epic records  reviewed    Assessment and Plan: 1. New onset afib with RVR General education re afib reviewed with pt Will rx 30 mg crdizem to use if afib returns, if afib does become recurrent problem with need daily rate control Discussed when to report to the ER vrs at home Cardizem for breakthrough afib Echo  2. 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 He is on Nexium as well. Finish the 30 day's required of Doac and then stop   3. Lifestyle issues contributing to afib Stop vapeing, nicotine may be contributing to overall stimulation of heart Pt has already stopped caffeine containing drinks He does not drink alcohol He is heavily using Afrin to the point that the decongestant may be having a systemic effect. Talk to PCP about how to wean off this Also discuss with PCP tolerance of cpap mask, nose, congestion issues and start using as this will have a big impact on the ability to maintain SR.  4. HTN Borderline today He feels not as well controlled since changed from valsartan Discuss with PCP as well  Will let pt know results of echo Refer to general cardiology as f/u Pt is to schedule f/u with PCP afib clinic as needed  Butch Penny C. Lonald Troiani, [facility] [address] [place], [place] [address] [phone]

## 2016-11-27 NOTE — Patient Instructions (Addendum)
START diltiazem 30 mg Take one tablet by mouth every 4 hours AS NEEDED for A-fib HR > 100 as long as BP > 100.  A referral was put in for Cardiology for 3-4 months. They will call you from their office to schedule this.    John [name] has ordered an echo for you. This has been scheduled for you Wednesday 12/06/16 at 8:00 a.m.  Please arrive 10 mins early

## 2016-12-06 ENCOUNTER — Ambulatory Visit (HOSPITAL_COMMUNITY)
Admission: RE | Admit: 2016-12-06 | Discharge: 2016-12-06 | Disposition: A | Discharge status: Home / Self Care | Payer: BLUE CROSS/BLUE SHIELD | Source: Ambulatory Visit | Attending: Nurse Practitioner | Admitting: Nurse Practitioner

## 2016-12-06 DIAGNOSIS — I480 Paroxysmal atrial fibrillation: Secondary | ICD-10-CM | POA: Diagnosis not present

## 2016-12-06 DIAGNOSIS — I358 Other nonrheumatic aortic valve disorders: Secondary | ICD-10-CM | POA: Diagnosis not present

## 2016-12-06 DIAGNOSIS — I119 Hypertensive heart disease without heart failure: Secondary | ICD-10-CM | POA: Diagnosis not present

## 2016-12-06 LAB — ECHOCARDIOGRAM COMPLETE
Ao-asc: 33 cm
CHL CUP DOP CALC LVOT VTI: 23.3 cm
CHL CUP MV DEC (S): 261
CHL CUP TV REG PEAK VELOCITY: 228 cm/s
E decel time: 261 msec
EERAT: 10.76
FS: 31 % (ref 28–44)
IV/PV OW: 0.89
LA ID, A-P, ES: 37 mm
LA vol: 39.9 mL
LADIAMINDEX: 1.74 cm/m2
LAVOLA4C: 29.3 mL
LAVOLIN: 18.7 mL/m2
LEFT ATRIUM END SYS DIAM: 37 mm
LV TDI E'MEDIAL: 7.4
LV e' LATERAL: 7.51 cm/s
LVEEAVG: 10.76
LVEEMED: 10.76
LVOT area: 2.01 cm2
LVOT diameter: 16 mm
LVOT peak grad rest: 5 mmHg
LVOTPV: 110 cm/s
LVOTSV: 47 mL
Lateral S' vel: 12.1 cm/s
MV Peak grad: 3 mmHg
MV pk E vel: 80.8 m/s
MVPKAVEL: 103 m/s
PW: 12.3 mm — AB (ref 0.6–1.1)
RV TAPSE: 25.8 mm
TDI e' lateral: 7.51
TR max vel: 228 cm/s

## 2016-12-06 NOTE — Progress Notes (Signed)
  Echocardiogram 2D Echocardiogram has been performed.  Karron Alvizo 12/06/2016, 8:50 AM

## 2016-12-18 DIAGNOSIS — M65342 Trigger finger, left ring finger: Secondary | ICD-10-CM | POA: Diagnosis not present

## 2016-12-18 DIAGNOSIS — M65341 Trigger finger, right ring finger: Secondary | ICD-10-CM | POA: Diagnosis not present

## 2016-12-19 DIAGNOSIS — G4733 Obstructive sleep apnea (adult) (pediatric): Secondary | ICD-10-CM | POA: Diagnosis not present

## 2016-12-19 DIAGNOSIS — F1819 Inhalant abuse with unspecified inhalant-induced disorder: Secondary | ICD-10-CM | POA: Diagnosis not present

## 2016-12-19 DIAGNOSIS — M1990 Unspecified osteoarthritis, unspecified site: Secondary | ICD-10-CM | POA: Diagnosis not present

## 2016-12-19 DIAGNOSIS — I480 Paroxysmal atrial fibrillation: Secondary | ICD-10-CM | POA: Diagnosis not present

## 2016-12-29 ENCOUNTER — Institutional Professional Consult (permissible substitution): Payer: BLUE CROSS/BLUE SHIELD | Admitting: Internal Medicine

## 2017-02-14 DIAGNOSIS — Z125 Encounter for screening for malignant neoplasm of prostate: Secondary | ICD-10-CM | POA: Diagnosis not present

## 2017-02-14 DIAGNOSIS — Z0000 Encounter for general adult medical examination without abnormal findings: Secondary | ICD-10-CM | POA: Diagnosis not present

## 2017-02-14 DIAGNOSIS — R7309 Other abnormal glucose: Secondary | ICD-10-CM | POA: Diagnosis not present

## 2017-02-14 DIAGNOSIS — I10 Essential (primary) hypertension: Secondary | ICD-10-CM | POA: Diagnosis not present

## 2017-02-22 DIAGNOSIS — Z0000 Encounter for general adult medical examination without abnormal findings: Secondary | ICD-10-CM | POA: Diagnosis not present

## 2017-02-22 DIAGNOSIS — Z1389 Encounter for screening for other disorder: Secondary | ICD-10-CM | POA: Diagnosis not present

## 2017-02-22 DIAGNOSIS — F1819 Inhalant abuse with unspecified inhalant-induced disorder: Secondary | ICD-10-CM | POA: Diagnosis not present

## 2017-02-22 DIAGNOSIS — E7800 Pure hypercholesterolemia, unspecified: Secondary | ICD-10-CM | POA: Diagnosis not present

## 2017-02-22 DIAGNOSIS — I480 Paroxysmal atrial fibrillation: Secondary | ICD-10-CM | POA: Diagnosis not present

## 2017-02-22 DIAGNOSIS — R7309 Other abnormal glucose: Secondary | ICD-10-CM | POA: Diagnosis not present

## 2017-03-05 DIAGNOSIS — Z1212 Encounter for screening for malignant neoplasm of rectum: Secondary | ICD-10-CM | POA: Diagnosis not present

## 2017-03-09 ENCOUNTER — Ambulatory Visit: Payer: BLUE CROSS/BLUE SHIELD | Admitting: Internal Medicine

## 2017-03-28 ENCOUNTER — Ambulatory Visit: Payer: BLUE CROSS/BLUE SHIELD | Admitting: Interventional Cardiology

## 2017-03-28 ENCOUNTER — Encounter (INDEPENDENT_AMBULATORY_CARE_PROVIDER_SITE_OTHER): Payer: Self-pay

## 2017-03-28 ENCOUNTER — Encounter: Payer: Self-pay | Admitting: Interventional Cardiology

## 2017-03-28 DIAGNOSIS — I480 Paroxysmal atrial fibrillation: Secondary | ICD-10-CM | POA: Diagnosis not present

## 2017-03-28 DIAGNOSIS — I119 Hypertensive heart disease without heart failure: Secondary | ICD-10-CM | POA: Diagnosis not present

## 2017-03-28 MED ORDER — ASPIRIN EC 81 MG PO TBEC: 81.0000 mg | DELAYED_RELEASE_TABLET | Freq: Every day | ORAL | 3 refills | Status: DC

## 2017-03-28 NOTE — Progress Notes (Addendum)
Cardiology Office Note   Date:  03/28/2017   ID:  NIAL HAWE, DOB [birth year], MRN [id number]  PCP:  Haywood Pao, MD    No chief complaint on file.  AFib  Wt Readings from Last 3 Encounters:  03/28/17 196 lb 1.9 oz (89 kg)  11/27/16 199 lb (90.3 kg)  11/22/16 180 lb (81.6 kg)       History of Present Illness: John [name] is a 61 y.o. male who is being seen today for the evaluation of atrial fibrillation at the request of Tommy Medal, MD.  He had a sudden onset of palpitations in August 2018.  He went to the ED.  He was cardioverted ad started on Eliquis.  He took this for one month.   Echo in 8/18 showed: - Left ventricle: The cavity size was normal. Wall thickness was   increased in a pattern of mild LVH. Systolic function was normal.   The estimated ejection fraction was in the range of 55% to 60%.   Wall motion was normal; there were no regional wall motion   abnormalities. Doppler parameters are consistent with abnormal   left ventricular relaxation (grade 1 diastolic dysfunction). - Aortic valve: Trileaflet; mildly thickened, mildly calcified   leaflets. - Left atrium: The atrium was normal in size.   He was seen in the AFib clinic.  He is here for f/u.      Past Medical History:  Diagnosis Date  . Anxiety and depression   . GERD (gastroesophageal reflux disease)   . Hernia    right inguinal  . History of bronchitis   . Hypertension   . Morton's neuroma   . OA (osteoarthritis)    neck  . Obstructive sleep apnea on CPAP   . Pancreatitis   . Torn meniscus    Left    Past Surgical History:  Procedure Laterality Date  . BONE CYST EXCISION  1991   left wrist  . COLONOSCOPY    . HERNIA REPAIR  04/14/11   right inguinal     Current Outpatient Medications  Medication Sig Dispense Refill  . albuterol (PROVENTIL HFA;VENTOLIN HFA) 108 (90 Base) MCG/ACT inhaler Inhale 1-2 puffs into the lungs every 6 (six) hours as needed for wheezing or  shortness of breath.    . clonazePAM (KLONOPIN) 0.5 MG tablet Take 0.5 mg by mouth as needed.  0  . diltiazem (CARDIZEM) 30 MG tablet Take one tablet by mouth every 4 hours AS NEEDED for A-fib HR > 100 as long as BP > 100. 30 tablet 0  . esomeprazole (NEXIUM) 20 MG capsule Take 20 mg by mouth daily at 12 noon.    . meloxicam (MOBIC) 15 MG tablet TAKE 1 TABLET BY MOUTH EVERY DAY    . methocarbamol (ROBAXIN) 500 MG tablet Take 500 mg by mouth every 6 (six) hours as needed for muscle spasms.    . mometasone (ASMANEX) 220 MCG/INH inhaler Inhale 1 puff into the lungs 2 (two) times daily.    . montelukast (SINGULAIR) 10 MG tablet Take 10 mg by mouth every morning.    . Multiple Vitamins-Minerals (MULTIVITAMIN WITH MINERALS) tablet Take 1 tablet by mouth daily.      Marland Kitchen olmesartan (BENICAR) 40 MG tablet Take 40 mg by mouth daily.    . Omega-3 Fatty Acids (FISH OIL) 1200 MG CAPS Take by mouth.    . sildenafil (VIAGRA) 100 MG tablet Take 100 mg by mouth daily as needed for  erectile dysfunction.     Marland Kitchen Specialty Vitamins Products (MAGNESIUM, AMINO ACID CHELATE,) 133 MG tablet Take 1 tablet by mouth 2 (two) times a week.     . traMADol (ULTRAM) 50 MG tablet Take 25-50 mg by mouth every morning.    . traMADol (ULTRAM) 50 MG tablet TAKE 1 TABLET BY MOUTH TWICE A DAY    . vitamin B-12 (CYANOCOBALAMIN) 100 MCG tablet Take 50 mcg by mouth daily.      Marland Kitchen aspirin EC 81 MG tablet Take 1 tablet (81 mg total) by mouth daily. 90 tablet 3   No current facility-administered medications for this visit.     Allergies:   Patient has no active allergies.    Social History:  The patient  reports that he has been smoking.  He has been smoking about 1.50 packs per day. he has never used smokeless tobacco. He reports that he drinks alcohol. He reports that he does not use drugs. He is vaping, not smoking.  Family History:  The patient's family history includes Dementia in his unknown relative; Hypertension in his brother,  mother, and sister.    ROS:  Please see the history of present illness.   Otherwise, review of systems are positive for intentional weight loss.   All other systems are reviewed and negative.    PHYSICAL EXAM: VS:  Ht 6' (1.829 m)   Wt 196 lb 1.9 oz (89 kg)   SpO2 92%   BMI 26.60 kg/m  , BMI Body mass index is 26.6 kg/m. 154/100 GEN: Well nourished, well developed, in no acute distress  HEENT: normal  Neck: no JVD, carotid bruits, or masses Cardiac: RRR; no murmurs, rubs, or gallops,no edema  Respiratory:  clear to auscultation bilaterally, normal work of breathing GI: soft, nontender, nondistended, + BS MS: no deformity or atrophy  Skin: warm and dry, no rash Neuro:  Strength and sensation are intact Psych: euthymic mood, full affect    Recent Labs: 11/23/2016: BUN 17; Creatinine, Ser 0.95; Hemoglobin 13.9; Platelets 185; Potassium 4.4; Sodium 143; TSH 2.375   Lipid Panel No results found for: CHOL, TRIG, HDL, CHOLHDL, VLDL, LDLCALC, LDLDIRECT   Other studies Reviewed: Additional studies/ records that were reviewed today with results demonstrating: 2018 echo: no significnat structural heart disease.  Normal TSH in 8/18   ASSESSMENT AND PLAN:  1. AFib: OK to start aspirin 81 mg daily.   Takes NSAID daily for pain.  OK to use Diltiazem as needed.   2. HTN: COntinue olmesartan.  Bp controlled at home.   This patients CHA2DS2-VASc Score and unadjusted Ischemic Stroke Rate (% per year) is equal to 0.6 % stroke rate/year from a score of 1  Above score calculated as 1 point each if present [CHF, HTN, DM, Vascular=MI/PAD/Aortic Plaque, Age if 65-74, or Male] Above score calculated as 2 points each if present [Age > 75, or Stroke/TIA/TE]     Current medicines are reviewed at length with the patient today.  The patient concerns regarding his medicines were addressed.  The following changes have been made:  No change  Labs/ tests ordered today include:  No orders of the  defined types were placed in this encounter.   Recommend 150 minutes/week of aerobic exercise Low fat, low carb, high fiber diet recommended  Disposition:   FU in 1 year   Signed, Larae Grooms, MD  03/28/2017 3:37 PM    [facility] [place], [place], [place]  [address] Phone: [phone]; Fax: [phone]

## 2017-03-28 NOTE — Patient Instructions (Signed)
Medication Instructions:  Your physician has recommended you make the following change in your medication:   START: Aspirin 81 mg daily  Labwork: None ordered  Testing/Procedures: None ordered  Follow-Up: Your physician wants you to follow-up in: 1 year with Dr. Irish Lack. You will receive a reminder letter in the mail two months in advance. If you don't receive a letter, please call our office to schedule the follow-up appointment.   Any Other Special Instructions Will Be Listed Below (If Applicable).     If you need a refill on your cardiac medications before your next appointment, please call your pharmacy.

## 2017-05-10 DIAGNOSIS — M545 Low back pain: Secondary | ICD-10-CM | POA: Diagnosis not present

## 2017-05-10 DIAGNOSIS — M4120 Other idiopathic scoliosis, site unspecified: Secondary | ICD-10-CM | POA: Diagnosis not present

## 2017-05-16 ENCOUNTER — Other Ambulatory Visit: Payer: Self-pay | Admitting: Rehabilitation

## 2017-05-16 DIAGNOSIS — M545 Low back pain: Secondary | ICD-10-CM

## 2017-05-21 DIAGNOSIS — M5416 Radiculopathy, lumbar region: Secondary | ICD-10-CM | POA: Diagnosis not present

## 2017-05-21 DIAGNOSIS — K4090 Unilateral inguinal hernia, without obstruction or gangrene, not specified as recurrent: Secondary | ICD-10-CM | POA: Diagnosis not present

## 2017-05-21 DIAGNOSIS — Z6828 Body mass index (BMI) 28.0-28.9, adult: Secondary | ICD-10-CM | POA: Diagnosis not present

## 2017-05-28 ENCOUNTER — Ambulatory Visit
Admission: RE | Admit: 2017-05-28 | Discharge: 2017-05-28 | Disposition: A | Discharge status: Home / Self Care | Payer: BLUE CROSS/BLUE SHIELD | Source: Ambulatory Visit | Attending: Rehabilitation | Admitting: Rehabilitation

## 2017-05-28 DIAGNOSIS — M545 Low back pain: Secondary | ICD-10-CM

## 2017-05-28 DIAGNOSIS — M48061 Spinal stenosis, lumbar region without neurogenic claudication: Secondary | ICD-10-CM | POA: Diagnosis not present

## 2017-06-05 DIAGNOSIS — M5136 Other intervertebral disc degeneration, lumbar region: Secondary | ICD-10-CM | POA: Diagnosis not present

## 2017-08-16 DIAGNOSIS — R000 Tachycardia, unspecified: Secondary | ICD-10-CM | POA: Diagnosis not present

## 2017-08-16 DIAGNOSIS — I480 Paroxysmal atrial fibrillation: Secondary | ICD-10-CM | POA: Diagnosis not present

## 2017-08-17 ENCOUNTER — Encounter (HOSPITAL_COMMUNITY): Payer: Self-pay

## 2017-08-17 ENCOUNTER — Emergency Department (HOSPITAL_COMMUNITY)
Admission: EM | Admit: 2017-08-17 | Discharge: 2017-08-17 | Disposition: A | Discharge status: Home / Self Care | Payer: BLUE CROSS/BLUE SHIELD | Attending: Emergency Medicine | Admitting: Emergency Medicine

## 2017-08-17 DIAGNOSIS — F17210 Nicotine dependence, cigarettes, uncomplicated: Secondary | ICD-10-CM | POA: Diagnosis not present

## 2017-08-17 DIAGNOSIS — I480 Paroxysmal atrial fibrillation: Secondary | ICD-10-CM | POA: Diagnosis not present

## 2017-08-17 DIAGNOSIS — I119 Hypertensive heart disease without heart failure: Secondary | ICD-10-CM | POA: Diagnosis not present

## 2017-08-17 DIAGNOSIS — Z79899 Other long term (current) drug therapy: Secondary | ICD-10-CM | POA: Insufficient documentation

## 2017-08-17 DIAGNOSIS — R000 Tachycardia, unspecified: Secondary | ICD-10-CM | POA: Diagnosis not present

## 2017-08-17 LAB — BASIC METABOLIC PANEL
Anion gap: 7 (ref 5–15)
BUN: 23 mg/dL — ABNORMAL HIGH (ref 6–20)
CHLORIDE: 106 mmol/L (ref 101–111)
CO2: 26 mmol/L (ref 22–32)
CREATININE: 0.99 mg/dL (ref 0.61–1.24)
Calcium: 8.7 mg/dL — ABNORMAL LOW (ref 8.9–10.3)
Glucose, Bld: 74 mg/dL (ref 65–99)
POTASSIUM: 4.3 mmol/L (ref 3.5–5.1)
SODIUM: 139 mmol/L (ref 135–145)

## 2017-08-17 LAB — CBC WITH DIFFERENTIAL/PLATELET
BASOS ABS: 0 10*3/uL (ref 0.0–0.1)
Basophils Relative: 0 %
Eosinophils Absolute: 0.2 10*3/uL (ref 0.0–0.7)
Eosinophils Relative: 2 %
HCT: 42.1 % (ref 39.0–52.0)
HEMOGLOBIN: 13.9 g/dL (ref 13.0–17.0)
LYMPHS ABS: 1.7 10*3/uL (ref 0.7–4.0)
Lymphocytes Relative: 20 %
MCH: 31.1 pg (ref 26.0–34.0)
MCHC: 33 g/dL (ref 30.0–36.0)
MCV: 94.2 fL (ref 78.0–100.0)
Monocytes Absolute: 1.1 10*3/uL — ABNORMAL HIGH (ref 0.1–1.0)
Monocytes Relative: 14 %
NEUTROS PCT: 64 %
Neutro Abs: 5.3 10*3/uL (ref 1.7–7.7)
Platelets: 181 10*3/uL (ref 150–400)
RBC: 4.47 MIL/uL (ref 4.22–5.81)
RDW: 12.7 % (ref 11.5–15.5)
WBC: 8.2 10*3/uL (ref 4.0–10.5)

## 2017-08-17 MED ORDER — PROPOFOL 10 MG/ML IV BOLUS
INTRAVENOUS | Status: AC | PRN
  Administered 2017-08-17: 45 mg via INTRAVENOUS

## 2017-08-17 MED ORDER — PROPOFOL 10 MG/ML IV BOLUS
45.0000 mg | Freq: Once | INTRAVENOUS | Status: AC
  Administered 2017-08-17: 45 mg via INTRAVENOUS
  Filled 2017-08-17: qty 20

## 2017-08-17 NOTE — ED Triage Notes (Signed)
Pt arrived via [facility], per EMS pt c/o tightness and burning sensation in central chest. EMS noted HR  Between 180s-200s; upon arrival to hospital HR noted to be between 140s and 180s; Ems states vagal nor ice worked to correct rhythm. Pt rec'd Cardiem 30mg  at approx 1050p; Pt rec'd Lopressor 25ml 2340; 18LAC; 108/70; Pt rec'd ASA 324mg 

## 2017-08-17 NOTE — Discharge Instructions (Addendum)
Continue taking low dose aspirin every day. Talk with your doctor at the [facility] about whether you should go back to taking the blood thinner (Eliquis). Try to stop vaping.

## 2017-08-17 NOTE — ED Provider Notes (Signed)
[facility] EMERGENCY DEPARTMENT Provider Note   CSN: [id number] Arrival date & time: 08/17/17  0013     History   Chief Complaint Chief Complaint  Patient presents with  . Chest Pain    HPI John [name] is a 62 y.o. male.   The history is provided by the patient.  He has history of hypertension, pancreatitis, GERD, paroxysmal atrial fibrillation and comes in with rapid heartbeat which started about 10:45 PM.  It woke him up from sleep.  He has 1 prior similar episode which was treated with cardioversion in the emergency department.  He did have some mild dyspnea and some chest discomfort at home.  He is not having any dyspnea or chest discomfort currently.  He did take a dose of diltiazem, with no improvement.  He took 4 baby aspirin at home and was brought in by ambulance.  EMS gave him metoprolol without any improvement.  He does admit to having had 2 cups of coffee and 1 cola beverage today.  He is continuing to use e-cigarettes.  He is currently off of anticoagulants and is just taking low-dose aspirin for prophylaxis against embolic disease.  Past Medical History:  Diagnosis Date  . Anxiety and depression   . GERD (gastroesophageal reflux disease)   . Hernia    right inguinal  . History of bronchitis   . Hypertension   . Morton's neuroma   . OA (osteoarthritis)    neck  . Obstructive sleep apnea on CPAP   . Pancreatitis   . Torn meniscus    Left    Patient Active Problem List   Diagnosis Date Noted  . PAF (paroxysmal atrial fibrillation) ([place]) 03/28/2017  . Hypertensive heart disease 03/28/2017  . Inguinal hernia unilateral, non-recurrent, right 04/05/2011    Past Surgical History:  Procedure Laterality Date  . BONE CYST EXCISION  1991   left wrist  . COLONOSCOPY    . HERNIA REPAIR  04/14/11   right inguinal        Home Medications    Prior to Admission medications   Medication Sig Start Date End Date Taking? Authorizing Provider    albuterol (PROVENTIL HFA;VENTOLIN HFA) 108 (90 Base) MCG/ACT inhaler Inhale 1-2 puffs into the lungs every 6 (six) hours as needed for wheezing or shortness of breath.    [provider]  aspirin EC 81 MG tablet Take 1 tablet (81 mg total) by mouth daily. 03/28/17   Allred, Jeneen Rinks, MD  clonazePAM (KLONOPIN) 0.5 MG tablet Take 0.5 mg by mouth as needed. 03/21/17   [provider]  diltiazem (CARDIZEM) 30 MG tablet Take one tablet by mouth every 4 hours AS NEEDED for A-fib HR > 100 as long as BP > 100. 11/27/16 11/27/17  Sherran Needs, NP  esomeprazole (NEXIUM) 20 MG capsule Take 20 mg by mouth daily at 12 noon.    [provider]  meloxicam (MOBIC) 15 MG tablet TAKE 1 TABLET BY MOUTH EVERY DAY 09/18/16   [provider]  methocarbamol (ROBAXIN) 500 MG tablet Take 500 mg by mouth every 6 (six) hours as needed for muscle spasms.    [provider]  mometasone (ASMANEX) 220 MCG/INH inhaler Inhale 1 puff into the lungs 2 (two) times daily.    [provider]  montelukast (SINGULAIR) 10 MG tablet Take 10 mg by mouth every morning.    [provider]  Multiple Vitamins-Minerals (MULTIVITAMIN WITH MINERALS) tablet Take 1 tablet by mouth daily.  [provider]  olmesartan (BENICAR) 40 MG tablet Take 40 mg by mouth daily.    [provider]  Omega-3 Fatty Acids (FISH OIL) 1200 MG CAPS Take by mouth.    [provider]  sildenafil (VIAGRA) 100 MG tablet Take 100 mg by mouth daily as needed for erectile dysfunction.     [provider]  Specialty Vitamins Products (MAGNESIUM, AMINO ACID CHELATE,) 133 MG tablet Take 1 tablet by mouth 2 (two) times a week.     [provider]  traMADol (ULTRAM) 50 MG tablet Take 25-50 mg by mouth every morning.    [provider]  traMADol (ULTRAM) 50 MG tablet TAKE 1 TABLET BY MOUTH TWICE A DAY 09/18/16   [provider]  vitamin B-12 (CYANOCOBALAMIN)  100 MCG tablet Take 50 mcg by mouth daily.      [provider]    Family History Family History  Problem Relation Age of Onset  . Hypertension Mother   . Dementia Unknown   . Hypertension Sister   . Hypertension Brother     Social History Social History   Tobacco Use  . Smoking status: Current Every Day Smoker    Packs/day: 1.50  . Smokeless tobacco: Never Used  Substance Use Topics  . Alcohol use: Yes  . Drug use: No     Allergies   Patient has no active allergies.   Review of Systems Review of Systems  All other systems reviewed and are negative.    Physical Exam Updated Vital Signs BP 101/66   Pulse (!) 163   Resp 16   SpO2 99%   Physical Exam  Nursing note and vitals reviewed.  62 year old male, resting comfortably and in no acute distress. Vital signs are significant for tachycardia. Oxygen saturation is 99%, which is normal. Head is normocephalic and atraumatic. PERRLA, EOMI. Oropharynx is clear. Neck is nontender and supple without adenopathy or JVD. Back is nontender and there is no CVA tenderness. Lungs are clear without rales, wheezes, or rhonchi. Chest is nontender. Heart is tachycardic and irregular without murmur. Abdomen is soft, flat, nontender without masses or hepatosplenomegaly and peristalsis is normoactive. Extremities have no cyanosis or edema, full range of motion is present. Skin is warm and dry without rash. Neurologic: Mental status is normal, cranial nerves are intact, there are no motor or sensory deficits.  ED Treatments / Results  Labs (all labs ordered are listed, but only abnormal results are displayed) Labs Reviewed  BASIC METABOLIC PANEL - Abnormal; Notable for the following components:      Result Value   BUN 23 (*)    Calcium 8.7 (*)    All other components within normal limits  CBC WITH DIFFERENTIAL/PLATELET - Abnormal; Notable for the following components:   Monocytes Absolute 1.1 (*)    All other  components within normal limits    EKG EKG Interpretation  Date/Time:  Friday Aug 17 2017 00:21:09 EDT Ventricular Rate:  169 PR Interval:    QRS Duration: 75 QT Interval:  273 QTC Calculation: 458 R Axis:   66 Text Interpretation:  Atrial fibrillation with rapid V-rate Abnormal R-wave progression, early transition Baseline wander in lead(s) V3 When compared with ECG of 11/27/2016, Atrial fibrillation with rapid ventricular response has replaced Sinus rhythm Confirmed by Delora Fuel ([id number]) on 08/17/2017 12:26:53 AM   EKG Interpretation  Date/Time:  Friday Aug 17 2017 00:49:44 EDT Ventricular Rate:  86 PR Interval:    QRS Duration:  74 QT Interval:  321 QTC Calculation: 384 R Axis:   72 Text Interpretation:  Sinus rhythm Abnormal R-wave progression, early transition When compared with ECG of EARLIER SAME DATE Sinus rhythm has replaced Atrial fibrillation with rapid ventricular response Confirmed by Delora Fuel ([id number]) on 08/17/2017 12:54:58 AM      Procedures .Sedation  Date/Time: 08/17/2017 12:51 AM  Performed by: Delora Fuel, MD  Authorized by: Delora Fuel, MD   Consent:    Consent obtained:  Verbal   Consent given by:  Patient   Risks discussed:  Allergic reaction, dysrhythmia, inadequate sedation, nausea, prolonged hypoxia resulting in organ damage, prolonged sedation necessitating reversal, respiratory compromise necessitating ventilatory assistance and intubation and vomiting   Alternatives discussed:  Analgesia without sedation, anxiolysis and regional anesthesia Universal protocol:    Procedure explained and questions answered to patient or proxy's satisfaction: yes     Relevant documents present and verified: yes     Test results available and properly labeled: yes     Imaging studies available: yes     Required blood products, implants, devices, and special equipment available: yes     Site/side marked: yes     Immediately prior to procedure a time out was  called: yes     Patient identity confirmation method:  Verbally with patient Indications:    Procedure necessitating sedation performed by:  Physician performing sedation   Intended level of sedation:  Deep Pre-sedation assessment:    Time since last food or drink:  4 hours   ASA classification: class 1 - normal, healthy patient     Neck mobility: normal     Mouth opening:  3 or more finger widths   Thyromental distance:  4 finger widths   Mallampati score:  I - soft palate, uvula, fauces, pillars visible   Pre-sedation assessments completed and reviewed: airway patency, cardiovascular function, hydration status, mental status, nausea/vomiting, pain level, respiratory function and temperature     Pre-sedation assessment completed:  08/17/2017 12:45 AM Immediate pre-procedure details:    Reassessment: Patient reassessed immediately prior to procedure     Reviewed: vital signs, relevant labs/tests and NPO status     Verified: bag valve mask available, emergency equipment available, intubation equipment available, IV patency confirmed, oxygen available and suction available   Procedure details (see MAR for exact dosages):    Preoxygenation:  Nasal cannula   Sedation:  Propofol   Intra-procedure monitoring:  Blood pressure monitoring, cardiac monitor, continuous pulse oximetry, frequent LOC assessments, frequent vital sign checks and continuous capnometry   Intra-procedure events: none     Total Provider sedation time (minutes):  30 Post-procedure details:    Post-sedation assessment completed:  08/17/2017 1:15 AM   Attendance: Constant attendance by certified staff until patient recovered     Recovery: Patient returned to pre-procedure baseline     Post-sedation assessments completed and reviewed: airway patency, cardiovascular function, hydration status, mental status, nausea/vomiting, pain level, respiratory function and temperature     Patient is stable for discharge or admission: yes      Patient tolerance:  Tolerated well, no immediate complications  .Cardioversion  Date/Time: 08/17/2017 12:52 AM  Performed by: Delora Fuel, MD  Authorized by: Delora Fuel, MD   Consent:    Consent obtained:  Written   Consent given by:  Patient   Risks discussed:  Cutaneous burn, death, induced arrhythmia and pain   Alternatives discussed:  Rate-control medication and alternative treatment Pre-procedure details:    Cardioversion basis:  Emergent   Rhythm:  Atrial fibrillation   Electrode placement:  Anterior-posterior Patient sedated: Yes. Refer to sedation procedure documentation for details of sedation.  Attempt one:    Cardioversion mode:  Synchronous   Waveform:  Biphasic   Shock (Joules):  150   Shock outcome:  Conversion to normal sinus rhythm Post-procedure details:    Patient status:  Alert   Patient tolerance of procedure:  Tolerated well, no immediate complications   CRITICAL CARE Performed by: Delora Fuel Total critical care time: 35 minutes Critical care time was exclusive of separately billable procedures and treating other patients. Critical care was necessary to treat or prevent imminent or life-threatening deterioration. Critical care was time spent personally by me on the following activities: development of treatment plan with patient and/or surrogate as well as nursing, discussions with consultants, evaluation of patient's response to treatment, examination of patient, obtaining history from patient or surrogate, ordering and performing treatments and interventions, ordering and review of laboratory studies, ordering and review of radiographic studies, pulse oximetry and re-evaluation of patient's condition.  Medications Ordered in ED Medications  propofol (DIPRIVAN) 10 mg/mL bolus/IV push 0.5 mg/kg (has no administration in time range)     Initial Impression / Assessment and Plan / ED Course  I have reviewed the triage vital signs and the nursing  notes.  Pertinent lab results that were available during my care of the patient were reviewed by me and considered in my medical decision making (see chart for details).  Atrial fibrillation with rapid ventricular response.  Old records are reviewed confirming ED visit last August for atrial fibrillation treated with cardioversion.  He was anticoagulated for 1 month and then switched to low-dose aspirin because he works as a Development worker, international aid and needs to take NSAIDs for pain control.  Patient was sedated with propofol and cardioverted successfully.  He will be observed in the ED for 1hour and then discharged with follow-up at the atrial fibrillation clinic.  2:02 AM Routine labs show no abnormalities.  His heart rhythm has been stable since cardioversion.  He is discharged with instructions to follow-up with the atrial fibrillation clinic at which point he can discuss whether he needs to go back on systemic anticoagulants, as well as whether he would benefit from being on an antiarrhythmic medication or from ablation therapy.  CHA2DS2/VAS Stroke Risk Points  Current as of 6 minutes ago     0 >= 2 Points: High Risk  1 - 1.99 Points: Medium Risk  0 Points: Low Risk    The patient's score has not changed in the past year.:  No Change     Details    This score determines the patient's risk of having a stroke if the  patient has atrial fibrillation.       Points Metrics  0 Has Congestive Heart Failure:  No    Current as of 6 minutes ago  0 Has Vascular Disease:  No    Current as of 6 minutes ago  0 Has Hypertension:  No    Current as of 6 minutes ago  0 Age:  75    Current as of 6 minutes ago  0 Has Diabetes:  No    Current as of 6 minutes ago  0 Had Stroke:  No  Had TIA:  No  Had thromboembolism:  No    Current as of 6 minutes ago  0 Male:  No    Current as of 6 minutes ago  Final Clinical Impressions(s) / ED Diagnoses   Final diagnoses:  Paroxysmal atrial fibrillation with rapid  ventricular response [facility])    ED Discharge Orders    None      Delora Fuel, MD [birth year] [phone]

## 2017-08-17 NOTE — ED Notes (Signed)
Pt discharged from ED; instructions provided; Pt encouraged to return to ED if symptoms worsen and to f/u with PCP; Pt verbalized understanding of all instructions 

## 2017-08-20 ENCOUNTER — Ambulatory Visit (HOSPITAL_COMMUNITY)
Admission: RE | Admit: 2017-08-20 | Discharge: 2017-08-20 | Disposition: A | Discharge status: Home / Self Care | Payer: BLUE CROSS/BLUE SHIELD | Source: Ambulatory Visit | Attending: Nurse Practitioner | Admitting: Nurse Practitioner

## 2017-08-20 ENCOUNTER — Encounter (HOSPITAL_COMMUNITY): Payer: Self-pay | Admitting: Nurse Practitioner

## 2017-08-20 VITALS — BP 138/88 | HR 106 | Ht 72.0 in | Wt 199.6 lb

## 2017-08-20 DIAGNOSIS — F17210 Nicotine dependence, cigarettes, uncomplicated: Secondary | ICD-10-CM | POA: Insufficient documentation

## 2017-08-20 DIAGNOSIS — K219 Gastro-esophageal reflux disease without esophagitis: Secondary | ICD-10-CM | POA: Diagnosis not present

## 2017-08-20 DIAGNOSIS — Z8249 Family history of ischemic heart disease and other diseases of the circulatory system: Secondary | ICD-10-CM | POA: Diagnosis not present

## 2017-08-20 DIAGNOSIS — G4733 Obstructive sleep apnea (adult) (pediatric): Secondary | ICD-10-CM | POA: Diagnosis not present

## 2017-08-20 DIAGNOSIS — I480 Paroxysmal atrial fibrillation: Secondary | ICD-10-CM | POA: Diagnosis not present

## 2017-08-20 DIAGNOSIS — F419 Anxiety disorder, unspecified: Secondary | ICD-10-CM | POA: Insufficient documentation

## 2017-08-20 DIAGNOSIS — Z9119 Patient's noncompliance with other medical treatment and regimen: Secondary | ICD-10-CM | POA: Insufficient documentation

## 2017-08-20 DIAGNOSIS — I4891 Unspecified atrial fibrillation: Secondary | ICD-10-CM | POA: Diagnosis not present

## 2017-08-20 DIAGNOSIS — Z791 Long term (current) use of non-steroidal anti-inflammatories (NSAID): Secondary | ICD-10-CM | POA: Diagnosis not present

## 2017-08-20 DIAGNOSIS — I10 Essential (primary) hypertension: Secondary | ICD-10-CM | POA: Insufficient documentation

## 2017-08-20 DIAGNOSIS — Z79899 Other long term (current) drug therapy: Secondary | ICD-10-CM | POA: Diagnosis not present

## 2017-08-20 DIAGNOSIS — F329 Major depressive disorder, single episode, unspecified: Secondary | ICD-10-CM | POA: Insufficient documentation

## 2017-08-20 MED ORDER — DRONEDARONE HCL 400 MG PO TABS: 400.0000 mg | ORAL_TABLET | Freq: Two times a day (BID) | ORAL | 3 refills | Status: DC

## 2017-08-20 MED ORDER — APIXABAN 5 MG PO TABS: 5.0000 mg | ORAL_TABLET | Freq: Two times a day (BID) | ORAL | 2 refills | Status: DC

## 2017-08-20 NOTE — Patient Instructions (Signed)
Start Eliquis 5mg  twice a day  Start Multaq 400mg  twice a day with food

## 2017-08-20 NOTE — Progress Notes (Signed)
Primary Care Physician: Haywood Pao, MD Referring Physician: [facility] ER   John [name] is a 62 y.o. male with a h/o anxiety and depression, HTN, obstructive sleep apnea, with new onset afib treated in [facility] ER 8/15, with RVR at 177 bpm. He had two doses of adenosine in EMS which did not change rhythm.   He did have successful cardioversion. He was drinking a lot of caffeine drinks,before ER visit, vapes with low nicotine option, is "addicted to Aftrin nose spray" and because of this may get some systemic effects from decongestants, has OSA but does not use cpap as he felt worsened his nasal congestion.  He works in yard care, owns his own business.  He has chronic back issues with herniated disc in 2014. He has to take meloxicam 15 mg daily as well as ultram daily to function in his physical job.He is concerned that he is now on eliquis 5 mg bid per cardioversion protocol. He has a chadsvasc score of 1,HTN, so he will not need anticoagulation long term by guidelines.   F/u in afib clinic, 08/20/17. Unfortunately, pt had return of afib with rvr. He required cardioversion.  He has had a flare of acute on chronic back pain and had taken a steroid taper, just finishing a couple of days before he had afib. He had gotten off daily Afrin  nose spray, but had uses around 3 weeks with the recent pollen issues and working outside. Marland Kitchen He was suppose to have a back injection tomorrow but will have to cancel. He was not started on anticoagulation after the cardioversion but by protocol will require anticoagulation x 4 weeks. He is aware that he will have to put off the back injection. He is very anxious re afib, " I never want to have another episode."  Struggled with anxiety for years.  Today, he denies symptoms of palpitations, chest pain, shortness of breath, orthopnea, PND, lower extremity edema, dizziness, presyncope, syncope, or neurologic sequela. The patient is tolerating medications without difficulties  and is otherwise without complaint today.   Past Medical History:  Diagnosis Date  . Anxiety and depression   . GERD (gastroesophageal reflux disease)   . Hernia    right inguinal  . History of bronchitis   . Hypertension   . Morton's neuroma   . OA (osteoarthritis)    neck  . Obstructive sleep apnea on CPAP   . Pancreatitis   . Torn meniscus    Left   Past Surgical History:  Procedure Laterality Date  . BONE CYST EXCISION  1991   left wrist  . COLONOSCOPY    . HERNIA REPAIR  04/14/11   right inguinal    Current Outpatient Medications  Medication Sig Dispense Refill  . clonazePAM (KLONOPIN) 0.5 MG tablet Take 0.5 mg by mouth as needed.  0  . esomeprazole (NEXIUM) 20 MG capsule Take 20 mg by mouth daily at 12 noon.    . mometasone (ASMANEX) 220 MCG/INH inhaler Inhale 1 puff into the lungs 2 (two) times daily.    . montelukast (SINGULAIR) 10 MG tablet Take 10 mg by mouth every morning.    . Multiple Vitamins-Minerals (MULTIVITAMIN WITH MINERALS) tablet Take 1 tablet by mouth daily.      Marland Kitchen olmesartan (BENICAR) 40 MG tablet Take 40 mg by mouth daily.    . Omega-3 Fatty Acids (FISH OIL) 1200 MG CAPS Take by mouth.    . sildenafil (VIAGRA) 100 MG tablet Take 100 mg  by mouth daily as needed for erectile dysfunction.     Marland Kitchen Specialty Vitamins Products (MAGNESIUM, AMINO ACID CHELATE,) 133 MG tablet Take 1 tablet by mouth 2 (two) times a week.     . traMADol (ULTRAM) 50 MG tablet Take 25-50 mg by mouth every morning.    . vitamin B-12 (CYANOCOBALAMIN) 100 MCG tablet Take 50 mcg by mouth daily.      Marland Kitchen albuterol (PROVENTIL HFA;VENTOLIN HFA) 108 (90 Base) MCG/ACT inhaler Inhale 1-2 puffs into the lungs every 6 (six) hours as needed for wheezing or shortness of breath.    Marland Kitchen apixaban (ELIQUIS) 5 MG TABS tablet Take 1 tablet (5 mg total) by mouth 2 (two) times daily. 60 tablet 2  . diltiazem (CARDIZEM) 30 MG tablet Take one tablet by mouth every 4 hours AS NEEDED for A-fib HR > 100 as long as  BP > 100. (Patient not taking: Reported on 08/20/2017) 30 tablet 0  . dronedarone (MULTAQ) 400 MG tablet Take 1 tablet (400 mg total) by mouth 2 (two) times daily with a meal. 60 tablet 3  . meloxicam (MOBIC) 15 MG tablet TAKE 1 TABLET BY MOUTH EVERY DAY    . methocarbamol (ROBAXIN) 500 MG tablet Take 500 mg by mouth every 6 (six) hours as needed for muscle spasms.     No current facility-administered medications for this encounter.     No Known Allergies  Social History   Socioeconomic History  . Marital status: Divorced    Spouse name: Not on file  . Number of children: Not on file  . Years of education: Not on file  . Highest education level: Not on file  Occupational History  . Not on file  Social Needs  . Financial resource strain: Not on file  . Food insecurity:    Worry: Not on file    Inability: Not on file  . Transportation needs:    Medical: Not on file    Non-medical: Not on file  Tobacco Use  . Smoking status: Current Every Day Smoker    Packs/day: 1.50  . Smokeless tobacco: Never Used  Substance and Sexual Activity  . Alcohol use: Yes  . Drug use: No  . Sexual activity: Not on file  Lifestyle  . Physical activity:    Days per week: Not on file    Minutes per session: Not on file  . Stress: Not on file  Relationships  . Social connections:    Talks on phone: Not on file    Gets together: Not on file    Attends religious service: Not on file    Active member of club or organization: Not on file    Attends meetings of clubs or organizations: Not on file    Relationship status: Not on file  . Intimate partner violence:    Fear of current or ex partner: Not on file    Emotionally abused: Not on file    Physically abused: Not on file    Forced sexual activity: Not on file  Other Topics Concern  . Not on file  Social History Narrative  . Not on file    Family History  Problem Relation Age of Onset  . Hypertension Mother   . Dementia Unknown   .  Hypertension Sister   . Hypertension Brother     ROS- All systems are reviewed and negative except as per the HPI above  Physical Exam: Vitals:   08/20/17 1439  BP: 138/88  Pulse: Marland Kitchen)  106  Weight: 199 lb 9.6 oz (90.5 kg)  Height: 6' (1.829 m)   Wt Readings from Last 3 Encounters:  08/20/17 199 lb 9.6 oz (90.5 kg)  03/28/17 196 lb 1.9 oz (89 kg)  11/27/16 199 lb (90.3 kg)    Labs: Lab Results  Component Value Date   NA 139 08/17/2017   K 4.3 08/17/2017   CL 106 08/17/2017   CO2 26 08/17/2017   GLUCOSE 74 08/17/2017   BUN 23 (H) 08/17/2017   CREATININE 0.99 08/17/2017   CALCIUM 8.7 (L) 08/17/2017   No results found for: INR No results found for: CHOL, HDL, LDLCALC, TRIG   GEN- The patient is well appearing, alert and oriented x 3 today.   Head- normocephalic, atraumatic Eyes-  Sclera clear, conjunctiva pink Ears- hearing intact Oropharynx- clear Neck- supple, no JVP Lymph- no cervical lymphadenopathy Lungs- Clear to ausculation bilaterally, normal work of breathing Heart- Regular rate and rhythm, no murmurs, rubs or gallops, PMI not laterally displaced GI- soft, NT, ND, + BS Extremities- no clubbing, cyanosis, or edema MS- no significant deformity or atrophy Skin- no rash or lesion Psych- euthymic mood, full affect Neuro- strength and sensation are intact  EKG- SR with PAC's, pr int 132 ms, qrs int 72 ms, qtc 416 ms Epic records reviewed Echo-Study Conclusions  - Left ventricle: The cavity size was normal. Wall thickness was   increased in a pattern of mild LVH. Systolic function was normal.   The estimated ejection fraction was in the range of 55% to 60%.   Wall motion was normal; there were no regional wall motion   abnormalities. Doppler parameters are consistent with abnormal   left ventricular relaxation (grade 1 diastolic dysfunction). - Aortic valve: Trileaflet; mildly thickened, mildly calcified   leaflets. - Left atrium: The atrium was normal in  size.    Assessment and Plan: 1. Paroxysmal afib with RVR Successful cardioversion By protocol pt will need to be on anticoagulation x 4 weeks Will Rx Eliquis 5 mg bid  He will have to hold off on back injection until he stops anticoagulation  General education re afib reviewed with pt Anxiety really plays into his response to episodes Encouraged pt to discuss with PCP how better to control this Discussed using antiarrythmic's to control afib episodes Will rx Multaq 400 mg bid with food Discussed when to report to the ER vrs at home Cardizem for breakthrough afib   2. 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 He is on Nexium as well. Finish the 30 day's required of [place] and then stop   3. Lifestyle issues contributing to afib He is not using CPAP and he was encouraged to do so  4. HTN Stable   F/u in one week  Butch Penny C. Imaan Padgett, [facility] [address] [place], [place] [address] [phone]

## 2017-08-21 ENCOUNTER — Telehealth (HOSPITAL_COMMUNITY): Payer: Self-pay | Admitting: *Deleted

## 2017-08-21 NOTE — Telephone Encounter (Signed)
Pt cld to ask if he should continue the breakthrough 30 mg cardizem during afib episode if he is taking multaq and eliquis.  The patient was informed that he would still use this cardizem 30 if HR over 767 and systolic BP over 341

## 2017-08-27 ENCOUNTER — Ambulatory Visit (HOSPITAL_COMMUNITY)
Admission: RE | Admit: 2017-08-27 | Discharge: 2017-08-27 | Disposition: A | Discharge status: Home / Self Care | Payer: BLUE CROSS/BLUE SHIELD | Source: Ambulatory Visit | Attending: Nurse Practitioner | Admitting: Nurse Practitioner

## 2017-08-27 ENCOUNTER — Encounter (HOSPITAL_COMMUNITY): Payer: Self-pay | Admitting: Nurse Practitioner

## 2017-08-27 VITALS — BP 128/84 | HR 88 | Ht 72.0 in | Wt 202.0 lb

## 2017-08-27 DIAGNOSIS — Z79899 Other long term (current) drug therapy: Secondary | ICD-10-CM | POA: Insufficient documentation

## 2017-08-27 DIAGNOSIS — F329 Major depressive disorder, single episode, unspecified: Secondary | ICD-10-CM | POA: Insufficient documentation

## 2017-08-27 DIAGNOSIS — M1990 Unspecified osteoarthritis, unspecified site: Secondary | ICD-10-CM | POA: Insufficient documentation

## 2017-08-27 DIAGNOSIS — Z820 Family history of epilepsy and other diseases of the nervous system: Secondary | ICD-10-CM | POA: Insufficient documentation

## 2017-08-27 DIAGNOSIS — I10 Essential (primary) hypertension: Secondary | ICD-10-CM | POA: Insufficient documentation

## 2017-08-27 DIAGNOSIS — Z79891 Long term (current) use of opiate analgesic: Secondary | ICD-10-CM | POA: Diagnosis not present

## 2017-08-27 DIAGNOSIS — K219 Gastro-esophageal reflux disease without esophagitis: Secondary | ICD-10-CM | POA: Insufficient documentation

## 2017-08-27 DIAGNOSIS — G4733 Obstructive sleep apnea (adult) (pediatric): Secondary | ICD-10-CM | POA: Diagnosis not present

## 2017-08-27 DIAGNOSIS — I480 Paroxysmal atrial fibrillation: Secondary | ICD-10-CM | POA: Diagnosis not present

## 2017-08-27 DIAGNOSIS — Z8249 Family history of ischemic heart disease and other diseases of the circulatory system: Secondary | ICD-10-CM | POA: Insufficient documentation

## 2017-08-27 DIAGNOSIS — F17210 Nicotine dependence, cigarettes, uncomplicated: Secondary | ICD-10-CM | POA: Insufficient documentation

## 2017-08-27 DIAGNOSIS — F419 Anxiety disorder, unspecified: Secondary | ICD-10-CM | POA: Diagnosis not present

## 2017-08-27 DIAGNOSIS — Z9889 Other specified postprocedural states: Secondary | ICD-10-CM | POA: Diagnosis not present

## 2017-08-27 DIAGNOSIS — Z7901 Long term (current) use of anticoagulants: Secondary | ICD-10-CM | POA: Diagnosis not present

## 2017-08-27 NOTE — Progress Notes (Signed)
Primary Care Physician: Haywood Pao, MD Referring Physician: [facility]   MCKENNA BORUFF is a 62 y.o. male with a h/o anxiety and depression, HTN, obstructive sleep apnea, with new onset afib treated in [facility] ER 8/15, with RVR at 177 bpm. He had two doses of adenosine in EMS which did not change rhythm.   He did have successful cardioversion. He was drinking a lot of caffeine drinks,before ER visit, vapes with low nicotine option, is "addicted to Aftrin nose spray" and because of this may get some systemic effects from decongestants, has OSA but does not use cpap as he felt worsened his nasal congestion.  He works in yard care, owns his own business.  He has chronic back issues with herniated disc in 2014. He has to take meloxicam 15 mg daily as well as ultram daily to function in his physical job.He is concerned that he is now on eliquis 5 mg bid per cardioversion protocol. He has a chadsvasc score of 1,HTN, so he will not need anticoagulation long term by guidelines.   F/u in afib clinic, 08/20/17. Unfortunately, pt had return of afib with rvr. He required cardioversion.  He has had a flare of acute on chronic back pain and had taken a steroid taper, just finishing a couple of days before he had afib. He had gotten off daily Afrin  nose spray, but had uses around 3 weeks with the recent pollen issues and working outside. Marland Kitchen He was suppose to have a back injection tomorrow but will have to cancel. He was not started on anticoagulation after the cardioversion but by protocol will require anticoagulation x 4 weeks. He is aware that he will have to put off the back injection. He is very anxious re afib, " I never want to have another episode."  Struggled with anxiety for years.  F/u in afib clinic, 5/20. Pt was started on multaq 400 mg bid. He has not had any further afib. He feels well on Multaq.EKG does not show any qtc prolongation.  Today, he denies symptoms of palpitations, chest pain, shortness of  breath, orthopnea, PND, lower extremity edema, dizziness, presyncope, syncope, or neurologic sequela. The patient is tolerating medications without difficulties and is otherwise without complaint today.   Past Medical History:  Diagnosis Date  . Anxiety and depression   . GERD (gastroesophageal reflux disease)   . Hernia    right inguinal  . History of bronchitis   . Hypertension   . Morton's neuroma   . OA (osteoarthritis)    neck  . Obstructive sleep apnea on CPAP   . Pancreatitis   . Torn meniscus    Left   Past Surgical History:  Procedure Laterality Date  . BONE CYST EXCISION  1991   left wrist  . COLONOSCOPY    . HERNIA REPAIR  04/14/11   right inguinal    Current Outpatient Medications  Medication Sig Dispense Refill  . albuterol (PROVENTIL HFA;VENTOLIN HFA) 108 (90 Base) MCG/ACT inhaler Inhale 1-2 puffs into the lungs every 6 (six) hours as needed for wheezing or shortness of breath.    Marland Kitchen apixaban (ELIQUIS) 5 MG TABS tablet Take 1 tablet (5 mg total) by mouth 2 (two) times daily. 60 tablet 2  . clonazePAM (KLONOPIN) 0.5 MG tablet Take 0.5 mg by mouth as needed.  0  . dronedarone (MULTAQ) 400 MG tablet Take 1 tablet (400 mg total) by mouth 2 (two) times daily with a meal. 60 tablet 3  .  esomeprazole (NEXIUM) 20 MG capsule Take 20 mg by mouth daily at 12 noon.    . mometasone (ASMANEX) 220 MCG/INH inhaler Inhale 1 puff into the lungs 2 (two) times daily.    . montelukast (SINGULAIR) 10 MG tablet Take 10 mg by mouth every morning.    . Multiple Vitamins-Minerals (MULTIVITAMIN WITH MINERALS) tablet Take 1 tablet by mouth daily.      Marland Kitchen olmesartan (BENICAR) 40 MG tablet Take 40 mg by mouth daily.    . Omega-3 Fatty Acids (FISH OIL) 1200 MG CAPS Take by mouth.    . sildenafil (VIAGRA) 100 MG tablet Take 100 mg by mouth daily as needed for erectile dysfunction.     Marland Kitchen Specialty Vitamins Products (MAGNESIUM, AMINO ACID CHELATE,) 133 MG tablet Take 1 tablet by mouth 2 (two) times a  week.     . traMADol (ULTRAM) 50 MG tablet Take 25-50 mg by mouth every morning.    . vitamin B-12 (CYANOCOBALAMIN) 100 MCG tablet Take 50 mcg by mouth daily.      Marland Kitchen diltiazem (CARDIZEM) 30 MG tablet Take one tablet by mouth every 4 hours AS NEEDED for A-fib HR > 100 as long as BP > 100. (Patient not taking: Reported on 08/20/2017) 30 tablet 0   No current facility-administered medications for this encounter.     No Known Allergies  Social History   Socioeconomic History  . Marital status: Divorced    Spouse name: Not on file  . Number of children: Not on file  . Years of education: Not on file  . Highest education level: Not on file  Occupational History  . Not on file  Social Needs  . Financial resource strain: Not on file  . Food insecurity:    Worry: Not on file    Inability: Not on file  . Transportation needs:    Medical: Not on file    Non-medical: Not on file  Tobacco Use  . Smoking status: Current Every Day Smoker    Packs/day: 1.50  . Smokeless tobacco: Never Used  Substance and Sexual Activity  . Alcohol use: Yes  . Drug use: No  . Sexual activity: Not on file  Lifestyle  . Physical activity:    Days per week: Not on file    Minutes per session: Not on file  . Stress: Not on file  Relationships  . Social connections:    Talks on phone: Not on file    Gets together: Not on file    Attends religious service: Not on file    Active member of club or organization: Not on file    Attends meetings of clubs or organizations: Not on file    Relationship status: Not on file  . Intimate partner violence:    Fear of current or ex partner: Not on file    Emotionally abused: Not on file    Physically abused: Not on file    Forced sexual activity: Not on file  Other Topics Concern  . Not on file  Social History Narrative  . Not on file    Family History  Problem Relation Age of Onset  . Hypertension Mother   . Dementia Unknown   . Hypertension Sister   .  Hypertension Brother     ROS- All systems are reviewed and negative except as per the HPI above  Physical Exam: Vitals:   08/27/17 0921  BP: 128/84  Pulse: 88  Weight: 202 lb (91.6 kg)  Height:  6' (1.829 m)   Wt Readings from Last 3 Encounters:  08/27/17 202 lb (91.6 kg)  08/20/17 199 lb 9.6 oz (90.5 kg)  03/28/17 196 lb 1.9 oz (89 kg)    Labs: Lab Results  Component Value Date   NA 139 08/17/2017   K 4.3 08/17/2017   CL 106 08/17/2017   CO2 26 08/17/2017   GLUCOSE 74 08/17/2017   BUN 23 (H) 08/17/2017   CREATININE 0.99 08/17/2017   CALCIUM 8.7 (L) 08/17/2017   No results found for: INR No results found for: CHOL, HDL, LDLCALC, TRIG   GEN- The patient is well appearing, alert and oriented x 3 today.   Head- normocephalic, atraumatic Eyes-  Sclera clear, conjunctiva pink Ears- hearing intact Oropharynx- clear Neck- supple, no JVP Lymph- no cervical lymphadenopathy Lungs- Clear to ausculation bilaterally, normal work of breathing Heart- Regular rate and rhythm, no murmurs, rubs or gallops, PMI not laterally displaced GI- soft, NT, ND, + BS Extremities- no clubbing, cyanosis, or edema MS- no significant deformity or atrophy Skin- no rash or lesion Psych- euthymic mood, full affect Neuro- strength and sensation are intact  EKG- NSR at 86 bpm, PR int 140 ms, qrs int 80 ms, qtc 433 ms Epic records reviewed Echo-Study Conclusions  - Left ventricle: The cavity size was normal. Wall thickness was   increased in a pattern of mild LVH. Systolic function was normal.   The estimated ejection fraction was in the range of 55% to 60%.   Wall motion was normal; there were no regional wall motion   abnormalities. Doppler parameters are consistent with abnormal   left ventricular relaxation (grade 1 diastolic dysfunction). - Aortic valve: Trileaflet; mildly thickened, mildly calcified   leaflets. - Left atrium: The atrium was normal in size.    Assessment and  Plan: 1. Paroxysmal afib with RVR Successful cardioversion By protocol pt will need to be on anticoagulation x 4 weeks Will Rx Eliquis 5 mg bid  He will have to hold off on back injection until he stops anticoagulation  General education re afib reviewed with pt Anxiety really plays into his response to episodes Encouraged pt to discuss with PCP how better to control anxiety  Multaq 400 mg bid started  And is tolerating, so far no further afib Discussed when to report to the ER vrs at home Cardizem for breakthrough afib   2. Chadsvasc score of 1 He was advised not to take NSAID's with Eliquis 5 mg bid  Denies bleeding history Finish the 30 day's required of Doac and then stop   3. Lifestyle issues contributing to afib He is not using CPAP and he was encouraged to do so He is trying to restart and will discuss further with PCP re updating mask  4. HTN Stable   F/u in one month  Butch Penny C. Carmel Waddington, [facility] [address] [place], [place] [address] [phone]

## 2017-09-10 DIAGNOSIS — I480 Paroxysmal atrial fibrillation: Secondary | ICD-10-CM | POA: Diagnosis not present

## 2017-09-10 DIAGNOSIS — E7800 Pure hypercholesterolemia, unspecified: Secondary | ICD-10-CM | POA: Diagnosis not present

## 2017-09-10 DIAGNOSIS — R7309 Other abnormal glucose: Secondary | ICD-10-CM | POA: Diagnosis not present

## 2017-09-10 DIAGNOSIS — M5416 Radiculopathy, lumbar region: Secondary | ICD-10-CM | POA: Diagnosis not present

## 2017-09-10 DIAGNOSIS — I10 Essential (primary) hypertension: Secondary | ICD-10-CM | POA: Diagnosis not present

## 2017-09-10 NOTE — Addendum Note (Signed)
Encounter addended by: Sherran Needs, NP on: 09/10/2017 11:53 AM  Actions taken: LOS modified

## 2017-10-01 ENCOUNTER — Encounter (HOSPITAL_COMMUNITY): Payer: Self-pay | Admitting: Nurse Practitioner

## 2017-10-01 ENCOUNTER — Ambulatory Visit (HOSPITAL_COMMUNITY)
Admission: RE | Admit: 2017-10-01 | Discharge: 2017-10-01 | Disposition: A | Discharge status: Home / Self Care | Payer: BLUE CROSS/BLUE SHIELD | Source: Ambulatory Visit | Attending: Nurse Practitioner | Admitting: Nurse Practitioner

## 2017-10-01 VITALS — BP 146/82 | HR 84 | Ht 72.0 in | Wt 202.0 lb

## 2017-10-01 DIAGNOSIS — I4891 Unspecified atrial fibrillation: Secondary | ICD-10-CM | POA: Diagnosis present

## 2017-10-01 DIAGNOSIS — F329 Major depressive disorder, single episode, unspecified: Secondary | ICD-10-CM | POA: Diagnosis not present

## 2017-10-01 DIAGNOSIS — I10 Essential (primary) hypertension: Secondary | ICD-10-CM | POA: Diagnosis not present

## 2017-10-01 DIAGNOSIS — I480 Paroxysmal atrial fibrillation: Secondary | ICD-10-CM | POA: Insufficient documentation

## 2017-10-01 DIAGNOSIS — Z8249 Family history of ischemic heart disease and other diseases of the circulatory system: Secondary | ICD-10-CM | POA: Insufficient documentation

## 2017-10-01 DIAGNOSIS — F17210 Nicotine dependence, cigarettes, uncomplicated: Secondary | ICD-10-CM | POA: Insufficient documentation

## 2017-10-01 DIAGNOSIS — G4733 Obstructive sleep apnea (adult) (pediatric): Secondary | ICD-10-CM | POA: Insufficient documentation

## 2017-10-01 DIAGNOSIS — Z7289 Other problems related to lifestyle: Secondary | ICD-10-CM | POA: Diagnosis not present

## 2017-10-01 DIAGNOSIS — Z79899 Other long term (current) drug therapy: Secondary | ICD-10-CM | POA: Diagnosis not present

## 2017-10-01 DIAGNOSIS — K219 Gastro-esophageal reflux disease without esophagitis: Secondary | ICD-10-CM | POA: Diagnosis not present

## 2017-10-01 DIAGNOSIS — F419 Anxiety disorder, unspecified: Secondary | ICD-10-CM | POA: Diagnosis not present

## 2017-10-01 DIAGNOSIS — M1990 Unspecified osteoarthritis, unspecified site: Secondary | ICD-10-CM | POA: Diagnosis not present

## 2017-10-01 LAB — HEPATIC FUNCTION PANEL
ALK PHOS: 72 U/L (ref 38–126)
ALT: 21 U/L (ref 17–63)
AST: 28 U/L (ref 15–41)
Albumin: 3.6 g/dL (ref 3.5–5.0)
BILIRUBIN DIRECT: 0.4 mg/dL (ref 0.1–0.5)
BILIRUBIN TOTAL: 0.6 mg/dL (ref 0.3–1.2)
TOTAL PROTEIN: 6.6 g/dL (ref 6.5–8.1)

## 2017-10-01 NOTE — Progress Notes (Signed)
Primary Care Physician: Haywood Pao, MD Referring Physician: [facility]   John [name] is a 62 y.o. male with a h/o anxiety and depression, HTN, obstructive sleep apnea, with new onset afib treated in [facility] ER 8/15, with RVR at 177 bpm. He had two doses of adenosine in EMS which did not change rhythm.   He did have successful cardioversion. He was drinking a lot of caffeine drinks,before ER visit, vapes with low nicotine option, is "addicted to Aftrin nose spray" and because of this may get some systemic effects from decongestants, has OSA but does not use cpap as he felt worsened his nasal congestion.  He works in yard care, owns his own business.  He has chronic back issues with herniated disc in 2014. He has to take meloxicam 15 mg daily as well as ultram daily to function in his physical job.He is concerned that he is now on eliquis 5 mg bid per cardioversion protocol. He has a chadsvasc score of 1,HTN, so he will not need anticoagulation long term by guidelines.   F/u in afib clinic, 08/20/17. Unfortunately, pt had return of afib with rvr. He required cardioversion.  He has had a flare of acute on chronic back pain and had taken a steroid taper, just finishing a couple of days before he had afib. He had gotten off daily Afrin  nose spray, but had uses around 3 weeks with the recent pollen issues and working outside. Marland Kitchen He was suppose to have a back injection tomorrow but will have to cancel. He was not started on anticoagulation after the cardioversion but by protocol will require anticoagulation x 4 weeks. He is aware that he will have to put off the back injection. He is very anxious re afib, " I never want to have another episode."  Struggled with anxiety for years.  F/u in afib clinic, 5/20. Pt was started on multaq 400 mg bid. He has not had any further afib. He feels well on Multaq.EKG does not show any qtc prolongation.  F/u with afib clinic, 6/24. He is staying in SR with Multaq and is  tolerating without any diarrhea or nausea.  Today, he denies symptoms of palpitations, chest pain, shortness of breath, orthopnea, PND, lower extremity edema, dizziness, presyncope, syncope, or neurologic sequela. The patient is tolerating medications without difficulties and is otherwise without complaint today.   Past Medical History:  Diagnosis Date  . Anxiety and depression   . GERD (gastroesophageal reflux disease)   . Hernia    right inguinal  . History of bronchitis   . Hypertension   . Morton's neuroma   . OA (osteoarthritis)    neck  . Obstructive sleep apnea on CPAP   . Pancreatitis   . Torn meniscus    Left   Past Surgical History:  Procedure Laterality Date  . BONE CYST EXCISION  1991   left wrist  . COLONOSCOPY    . HERNIA REPAIR  04/14/11   right inguinal    Current Outpatient Medications  Medication Sig Dispense Refill  . albuterol (PROVENTIL HFA;VENTOLIN HFA) 108 (90 Base) MCG/ACT inhaler Inhale 1-2 puffs into the lungs every 6 (six) hours as needed for wheezing or shortness of breath.    . clonazePAM (KLONOPIN) 0.5 MG tablet Take 0.5 mg by mouth as needed.  0  . dronedarone (MULTAQ) 400 MG tablet Take 1 tablet (400 mg total) by mouth 2 (two) times daily with a meal. 60 tablet 3  .  esomeprazole (NEXIUM) 20 MG capsule Take 20 mg by mouth daily at 12 noon.    . mometasone (ASMANEX) 220 MCG/INH inhaler Inhale 1 puff into the lungs daily.     . montelukast (SINGULAIR) 10 MG tablet Take 10 mg by mouth every morning.    . Multiple Vitamins-Minerals (MULTIVITAMIN WITH MINERALS) tablet Take 1 tablet by mouth daily.      Marland Kitchen olmesartan (BENICAR) 40 MG tablet Take 40 mg by mouth daily.    . Omega-3 Fatty Acids (FISH OIL) 1200 MG CAPS Take by mouth.    . sildenafil (VIAGRA) 100 MG tablet Take 100 mg by mouth daily as needed for erectile dysfunction.     Marland Kitchen Specialty Vitamins Products (MAGNESIUM, AMINO ACID CHELATE,) 133 MG tablet Take 1 tablet by mouth 2 (two) times a week.      . traMADol (ULTRAM) 50 MG tablet Take 25-50 mg by mouth every morning.    . vitamin B-12 (CYANOCOBALAMIN) 100 MCG tablet Take 50 mcg by mouth daily.      Marland Kitchen diltiazem (CARDIZEM) 30 MG tablet Take one tablet by mouth every 4 hours AS NEEDED for A-fib HR > 100 as long as BP > 100. (Patient not taking: Reported on 10/01/2017) 30 tablet 0   No current facility-administered medications for this encounter.     No Known Allergies  Social History   Socioeconomic History  . Marital status: Divorced    Spouse name: Not on file  . Number of children: Not on file  . Years of education: Not on file  . Highest education level: Not on file  Occupational History  . Not on file  Social Needs  . Financial resource strain: Not on file  . Food insecurity:    Worry: Not on file    Inability: Not on file  . Transportation needs:    Medical: Not on file    Non-medical: Not on file  Tobacco Use  . Smoking status: Current Every Day Smoker    Packs/day: 1.50  . Smokeless tobacco: Never Used  Substance and Sexual Activity  . Alcohol use: Yes  . Drug use: No  . Sexual activity: Not on file  Lifestyle  . Physical activity:    Days per week: Not on file    Minutes per session: Not on file  . Stress: Not on file  Relationships  . Social connections:    Talks on phone: Not on file    Gets together: Not on file    Attends religious service: Not on file    Active member of club or organization: Not on file    Attends meetings of clubs or organizations: Not on file    Relationship status: Not on file  . Intimate partner violence:    Fear of current or ex partner: Not on file    Emotionally abused: Not on file    Physically abused: Not on file    Forced sexual activity: Not on file  Other Topics Concern  . Not on file  Social History Narrative  . Not on file    Family History  Problem Relation Age of Onset  . Hypertension Mother   . Dementia Unknown   . Hypertension Sister   .  Hypertension Brother     ROS- All systems are reviewed and negative except as per the HPI above  Physical Exam: Vitals:   10/01/17 0846  BP: (!) 146/82  Pulse: 84  Weight: 202 lb (91.6 kg)  Height: 6' (  1.829 m)   Wt Readings from Last 3 Encounters:  10/01/17 202 lb (91.6 kg)  08/27/17 202 lb (91.6 kg)  08/20/17 199 lb 9.6 oz (90.5 kg)    Labs: Lab Results  Component Value Date   NA 139 08/17/2017   K 4.3 08/17/2017   CL 106 08/17/2017   CO2 26 08/17/2017   GLUCOSE 74 08/17/2017   BUN 23 (H) 08/17/2017   CREATININE 0.99 08/17/2017   CALCIUM 8.7 (L) 08/17/2017   No results found for: INR No results found for: CHOL, HDL, LDLCALC, TRIG   GEN- The patient is well appearing, alert and oriented x 3 today.   Head- normocephalic, atraumatic Eyes-  Sclera clear, conjunctiva pink Ears- hearing intact Oropharynx- clear Neck- supple, no JVP Lymph- no cervical lymphadenopathy Lungs- Clear to ausculation bilaterally, normal work of breathing Heart- Regular rate and rhythm, no murmurs, rubs or gallops, PMI not laterally displaced GI- soft, NT, ND, + BS Extremities- no clubbing, cyanosis, or edema MS- no significant deformity or atrophy Skin- no rash or lesion Psych- euthymic mood, full affect Neuro- strength and sensation are intact  EKG- NSR at 84 bpm, PR int 138 ms, qrs int 92 ms, qtc 451 ms Epic records reviewed Echo-Study Conclusions  - Left ventricle: The cavity size was normal. Wall thickness was   increased in a pattern of mild LVH. Systolic function was normal.   The estimated ejection fraction was in the range of 55% to 60%.   Wall motion was normal; there were no regional wall motion   abnormalities. Doppler parameters are consistent with abnormal   left ventricular relaxation (grade 1 diastolic dysfunction). - Aortic valve: Trileaflet; mildly thickened, mildly calcified   leaflets. - Left atrium: The atrium was normal in size.    Assessment and  Plan: 1. Paroxysmal afib with RVR Successful cardioversion Took anticoagulation x 4 weeks He is pending a back injection, discussed the small chance that it could trigger afib Anxiety really plays into his response to episodes Encouraged pt to discuss with PCP how better to control anxiety Multaq 400 mg bid started and is tolerating, so far no further afib Discussed when to report to the ER vrs at home Cardizem for breakthrough afib   2. Chadsvasc score of 1  He is now off  Eliquis 5 mg bid for the one month following cardioversion  Denies bleeding history   3. Lifestyle issues contributing to afib He is not using CPAP and he was encouraged to do so He is trying to restart and will discuss further with PCP re updating mask  4. HTN Stable   F/u in 4 months  Geroge Baseman. Cranford Blessinger, [facility] [address] [place], [place] [address] [phone]

## 2017-10-29 DIAGNOSIS — K4090 Unilateral inguinal hernia, without obstruction or gangrene, not specified as recurrent: Secondary | ICD-10-CM | POA: Diagnosis not present

## 2017-10-29 DIAGNOSIS — N50812 Left testicular pain: Secondary | ICD-10-CM | POA: Diagnosis not present

## 2017-11-11 ENCOUNTER — Other Ambulatory Visit (HOSPITAL_COMMUNITY): Payer: Self-pay | Admitting: Nurse Practitioner

## 2017-11-19 DIAGNOSIS — K4090 Unilateral inguinal hernia, without obstruction or gangrene, not specified as recurrent: Secondary | ICD-10-CM | POA: Diagnosis not present

## 2017-11-25 ENCOUNTER — Other Ambulatory Visit (HOSPITAL_COMMUNITY): Payer: Self-pay | Admitting: Nurse Practitioner

## 2018-01-28 ENCOUNTER — Ambulatory Visit (HOSPITAL_COMMUNITY): Payer: BLUE CROSS/BLUE SHIELD | Admitting: Nurse Practitioner

## 2018-02-11 ENCOUNTER — Encounter (HOSPITAL_COMMUNITY): Payer: Self-pay | Admitting: Nurse Practitioner

## 2018-02-11 ENCOUNTER — Ambulatory Visit (HOSPITAL_COMMUNITY)
Admission: RE | Admit: 2018-02-11 | Discharge: 2018-02-11 | Disposition: A | Discharge status: Home / Self Care | Payer: BLUE CROSS/BLUE SHIELD | Source: Ambulatory Visit | Attending: Nurse Practitioner | Admitting: Nurse Practitioner

## 2018-02-11 VITALS — BP 158/94 | HR 86 | Ht 72.0 in | Wt 205.0 lb

## 2018-02-11 DIAGNOSIS — M47812 Spondylosis without myelopathy or radiculopathy, cervical region: Secondary | ICD-10-CM | POA: Diagnosis not present

## 2018-02-11 DIAGNOSIS — I10 Essential (primary) hypertension: Secondary | ICD-10-CM | POA: Diagnosis not present

## 2018-02-11 DIAGNOSIS — K219 Gastro-esophageal reflux disease without esophagitis: Secondary | ICD-10-CM | POA: Diagnosis not present

## 2018-02-11 DIAGNOSIS — G4733 Obstructive sleep apnea (adult) (pediatric): Secondary | ICD-10-CM | POA: Insufficient documentation

## 2018-02-11 DIAGNOSIS — Z791 Long term (current) use of non-steroidal anti-inflammatories (NSAID): Secondary | ICD-10-CM | POA: Insufficient documentation

## 2018-02-11 DIAGNOSIS — Z8249 Family history of ischemic heart disease and other diseases of the circulatory system: Secondary | ICD-10-CM | POA: Insufficient documentation

## 2018-02-11 DIAGNOSIS — F419 Anxiety disorder, unspecified: Secondary | ICD-10-CM | POA: Insufficient documentation

## 2018-02-11 DIAGNOSIS — F17210 Nicotine dependence, cigarettes, uncomplicated: Secondary | ICD-10-CM | POA: Insufficient documentation

## 2018-02-11 DIAGNOSIS — Z7951 Long term (current) use of inhaled steroids: Secondary | ICD-10-CM | POA: Diagnosis not present

## 2018-02-11 DIAGNOSIS — I480 Paroxysmal atrial fibrillation: Secondary | ICD-10-CM | POA: Diagnosis not present

## 2018-02-11 DIAGNOSIS — Z79899 Other long term (current) drug therapy: Secondary | ICD-10-CM | POA: Insufficient documentation

## 2018-02-11 DIAGNOSIS — I4891 Unspecified atrial fibrillation: Secondary | ICD-10-CM | POA: Diagnosis not present

## 2018-02-11 DIAGNOSIS — F329 Major depressive disorder, single episode, unspecified: Secondary | ICD-10-CM | POA: Diagnosis not present

## 2018-02-11 NOTE — Progress Notes (Signed)
Primary Care Physician: Haywood Pao, MD Referring Physician: [facility]   John [name] is a 62 y.o. male with a h/o anxiety and depression, HTN, obstructive sleep apnea, with new onset afib treated in [facility] ER 8/15, with RVR at 177 bpm. He had two doses of adenosine in EMS which did not change rhythm.   He did have successful cardioversion. He was drinking a lot of caffeine drinks,before ER visit, vapes with low nicotine option, is "addicted to Aftrin nose spray" and because of this may get some systemic effects from decongestants, has OSA but does not use cpap as he felt worsened his nasal congestion.  He works in yard care, owns his own business.  He has chronic back issues with herniated disc in 2014. He has to take meloxicam 15 mg daily as well as ultram daily to function in his physical job.He is concerned that he is now on eliquis 5 mg bid per cardioversion protocol. He has a chadsvasc score of 1,HTN, so he will not need anticoagulation long term by guidelines.   F/u in afib clinic, 08/20/17. Unfortunately, pt had return of afib with rvr. He required cardioversion.  He has had a flare of acute on chronic back pain and had taken a steroid taper, just finishing a couple of days before he had afib. He had gotten off daily Afrin  nose spray, but had uses around 3 weeks with the recent pollen issues and working outside. Marland Kitchen He was suppose to have a back injection tomorrow but will have to cancel. He was not started on anticoagulation after the cardioversion but by protocol will require anticoagulation x 4 weeks. He is aware that he will have to put off the back injection. He is very anxious re afib, " I never want to have another episode."  Struggled with anxiety for years.  F/u in afib clinic, 5/20. Pt was started on multaq 400 mg bid. He has not had any further afib. He feels well on Multaq.EKG does not show any qtc prolongation.  F/u with afib clinic, 6/24. He is staying in SR with Multaq and is  tolerating without any diarrhea or nausea.  F/u in afib clinic, 11/4. He is feeling well. He is staying in rhythm with Multaq. BP is elevated on presentation but states white coat hypertension.BP 's well managed per pt.  Today, he denies symptoms of palpitations, chest pain, shortness of breath, orthopnea, PND, lower extremity edema, dizziness, presyncope, syncope, or neurologic sequela. The patient is tolerating medications without difficulties and is otherwise without complaint today.   Past Medical History:  Diagnosis Date  . Anxiety and depression   . GERD (gastroesophageal reflux disease)   . Hernia    right inguinal  . History of bronchitis   . Hypertension   . Morton's neuroma   . OA (osteoarthritis)    neck  . Obstructive sleep apnea on CPAP   . Pancreatitis   . Torn meniscus    Left   Past Surgical History:  Procedure Laterality Date  . BONE CYST EXCISION  1991   left wrist  . COLONOSCOPY    . HERNIA REPAIR  04/14/11   right inguinal    Current Outpatient Medications  Medication Sig Dispense Refill  . clonazePAM (KLONOPIN) 0.5 MG tablet Take 0.5 mg by mouth as needed.  0  . diltiazem (CARDIZEM) 30 MG tablet TAKE ONE TABLET BY MOUTH EVERY 4 HOURS AS NEEDED FOR A-FIB HR > 100 AS LONG AS BP >  100. 30 tablet 0  . esomeprazole (NEXIUM) 20 MG capsule Take 20 mg by mouth daily at 12 noon.    . mometasone (ASMANEX) 220 MCG/INH inhaler Inhale 1 puff into the lungs daily.     . montelukast (SINGULAIR) 10 MG tablet Take 10 mg by mouth every morning.    . MULTAQ 400 MG tablet TAKE 1 TABLET (400 MG TOTAL) BY MOUTH 2 (TWO) TIMES DAILY WITH A MEAL. 180 tablet 1  . Multiple Vitamins-Minerals (MULTIVITAMIN WITH MINERALS) tablet Take 1 tablet by mouth daily.      Marland Kitchen olmesartan (BENICAR) 40 MG tablet Take 40 mg by mouth daily.    . Omega-3 Fatty Acids (FISH OIL) 1200 MG CAPS Take by mouth.    . sildenafil (VIAGRA) 100 MG tablet Take 100 mg by mouth daily as needed for erectile  dysfunction.     Marland Kitchen Specialty Vitamins Products (MAGNESIUM, AMINO ACID CHELATE,) 133 MG tablet Take 1 tablet by mouth 2 (two) times a week.     . traMADol (ULTRAM) 50 MG tablet Take 25-50 mg by mouth every morning.    . vitamin B-12 (CYANOCOBALAMIN) 100 MCG tablet Take 50 mcg by mouth daily.      Marland Kitchen albuterol (PROVENTIL HFA;VENTOLIN HFA) 108 (90 Base) MCG/ACT inhaler Inhale 1-2 puffs into the lungs every 6 (six) hours as needed for wheezing or shortness of breath.     No current facility-administered medications for this encounter.     No Known Allergies  Social History   Socioeconomic History  . Marital status: Divorced    Spouse name: Not on file  . Number of children: Not on file  . Years of education: Not on file  . Highest education level: Not on file  Occupational History  . Not on file  Social Needs  . Financial resource strain: Not on file  . Food insecurity:    Worry: Not on file    Inability: Not on file  . Transportation needs:    Medical: Not on file    Non-medical: Not on file  Tobacco Use  . Smoking status: Current Every Day Smoker    Packs/day: 1.50  . Smokeless tobacco: Never Used  Substance and Sexual Activity  . Alcohol use: Yes  . Drug use: No  . Sexual activity: Not on file  Lifestyle  . Physical activity:    Days per week: Not on file    Minutes per session: Not on file  . Stress: Not on file  Relationships  . Social connections:    Talks on phone: Not on file    Gets together: Not on file    Attends religious service: Not on file    Active member of club or organization: Not on file    Attends meetings of clubs or organizations: Not on file    Relationship status: Not on file  . Intimate partner violence:    Fear of current or ex partner: Not on file    Emotionally abused: Not on file    Physically abused: Not on file    Forced sexual activity: Not on file  Other Topics Concern  . Not on file  Social History Narrative  . Not on file     Family History  Problem Relation Age of Onset  . Hypertension Mother   . Dementia Unknown   . Hypertension Sister   . Hypertension Brother     ROS- All systems are reviewed and negative except as per the HPI above  Physical Exam: Vitals:   02/11/18 0923  BP: (!) 158/94  Pulse: 86  Weight: 93 kg  Height: 6' (1.829 m)   Wt Readings from Last 3 Encounters:  02/11/18 93 kg  10/01/17 91.6 kg  08/27/17 91.6 kg    Labs: Lab Results  Component Value Date   NA 139 08/17/2017   K 4.3 08/17/2017   CL 106 08/17/2017   CO2 26 08/17/2017   GLUCOSE 74 08/17/2017   BUN 23 (H) 08/17/2017   CREATININE 0.99 08/17/2017   CALCIUM 8.7 (L) 08/17/2017   No results found for: INR No results found for: CHOL, HDL, LDLCALC, TRIG   GEN- The patient is well appearing, alert and oriented x 3 today.   Head- normocephalic, atraumatic Eyes-  Sclera clear, conjunctiva pink Ears- hearing intact Oropharynx- clear Neck- supple, no JVP Lymph- no cervical lymphadenopathy Lungs- Clear to ausculation bilaterally, normal work of breathing Heart- Regular rate and rhythm, no murmurs, rubs or gallops, PMI not laterally displaced GI- soft, NT, ND, + BS Extremities- no clubbing, cyanosis, or edema MS- no significant deformity or atrophy Skin- no rash or lesion Psych- euthymic mood, full affect Neuro- strength and sensation are intact  EKG- NSR at 86 bpm, PR int 136 ms, qrs int 82 ms, qtc 440 ms Epic records reviewed Echo-Study Conclusions  - Left ventricle: The cavity size was normal. Wall thickness was   increased in a pattern of mild LVH. Systolic function was normal.   The estimated ejection fraction was in the range of 55% to 60%.   Wall motion was normal; there were no regional wall motion   abnormalities. Doppler parameters are consistent with abnormal   left ventricular relaxation (grade 1 diastolic dysfunction). - Aortic valve: Trileaflet; mildly thickened, mildly calcified    leaflets. - Left atrium: The atrium was normal in size.    Assessment and Plan: 1. Paroxysmal afib with RVR Staying in SR with multaq Anxiety really plays into his response to episodes Continue Multaq 400 mg bid  Discussed when to report to the ER vrs at home Cardizem for breakthrough afib   2. Chadsvasc score of 1 By guidelines, he does not need to be on anticoagulation at this point  Denies bleeding history  3. Lifestyle issues contributing to afib He is not using CPAP and he was encouraged to do so He is trying to restart and will discuss further with PCP re updating mask He is reestablishing with a new PCP in a couple of weeks  4. HTN Elevated today but states at home it is well managed on current meds  F/u in 6 months  Butch Penny C. Nanetta Wiegman, [facility] [address] [place], [place] [address] [phone]

## 2018-03-04 ENCOUNTER — Ambulatory Visit (INDEPENDENT_AMBULATORY_CARE_PROVIDER_SITE_OTHER): Payer: BLUE CROSS/BLUE SHIELD | Admitting: Family Medicine

## 2018-03-04 ENCOUNTER — Encounter: Payer: Self-pay | Admitting: Family Medicine

## 2018-03-04 DIAGNOSIS — G8929 Other chronic pain: Secondary | ICD-10-CM | POA: Insufficient documentation

## 2018-03-04 DIAGNOSIS — I10 Essential (primary) hypertension: Secondary | ICD-10-CM | POA: Diagnosis not present

## 2018-03-04 DIAGNOSIS — J449 Chronic obstructive pulmonary disease, unspecified: Secondary | ICD-10-CM | POA: Insufficient documentation

## 2018-03-04 DIAGNOSIS — I480 Paroxysmal atrial fibrillation: Secondary | ICD-10-CM | POA: Diagnosis not present

## 2018-03-04 DIAGNOSIS — K4090 Unilateral inguinal hernia, without obstruction or gangrene, not specified as recurrent: Secondary | ICD-10-CM

## 2018-03-04 DIAGNOSIS — J309 Allergic rhinitis, unspecified: Secondary | ICD-10-CM | POA: Insufficient documentation

## 2018-03-04 DIAGNOSIS — K219 Gastro-esophageal reflux disease without esophagitis: Secondary | ICD-10-CM

## 2018-03-04 DIAGNOSIS — M5440 Lumbago with sciatica, unspecified side: Secondary | ICD-10-CM | POA: Diagnosis not present

## 2018-03-04 DIAGNOSIS — N529 Male erectile dysfunction, unspecified: Secondary | ICD-10-CM | POA: Insufficient documentation

## 2018-03-04 DIAGNOSIS — F419 Anxiety disorder, unspecified: Secondary | ICD-10-CM | POA: Insufficient documentation

## 2018-03-04 NOTE — Assessment & Plan Note (Signed)
Stable.  Continue Singulair and Nasacort.

## 2018-03-04 NOTE — Assessment & Plan Note (Addendum)
Stable.  Will be undergoing surgery in a few weeks.

## 2018-03-04 NOTE — Assessment & Plan Note (Signed)
Elevated today, however patient has home blood pressure readings at goal.  We will continue Benicar 40 mg daily he will continue monitoring at home.  Discussed importance of regular exercise and low-sodium diet.  He will follow-up soon for his CPE.

## 2018-03-04 NOTE — Assessment & Plan Note (Signed)
No red flags.  Continue Robaxin, meloxicam, and tramadol.  Consider referral to sports medicine in the near future.

## 2018-03-04 NOTE — Assessment & Plan Note (Signed)
Stable exam.  Continue Asmanex inhaler.  Encouraged cessation from vaping nicotine.

## 2018-03-04 NOTE — Patient Instructions (Signed)
It was very nice to see you today!  Keep up the good work! No changes today.   Please come in to see Dr Paulla Fore soon for your back.  Come back to see me after the Holidays for your physical with blood work.   Take care, Dr [name]

## 2018-03-04 NOTE — Assessment & Plan Note (Addendum)
Stable.  Continue Klonopinhe does not need a refill today.

## 2018-03-04 NOTE — Assessment & Plan Note (Signed)
Stable.  Continue Viagra 100 mg daily as needed.

## 2018-03-04 NOTE — Progress Notes (Signed)
   Subjective:  John [name] is a 62 y.o. male who presents today with a chief complaint of hypertension and to establish care.  HPI:  Patient has several stable, chronic conditions outlined below: 1.  Hypertension with whitecoat hypertension.  Currently on Benicar 40 mg daily and tolerating well.  Home blood pressure readings typically in the 110s to 120s over 70s to 80s. 2.  Paroxysmal A. fib.  Follows with cardiology.  On Multaq.  Not on any anticoagulation. 3.  Chronic low back pain with sciatica secondary to herniated disc.  Several year history.  Stable.  Currently on meloxicam 15 mg daily, Robaxin 500 mg daily as needed, and tramadol 25 to 50 mg 3 times daily as needed.  Medications help with pain.  Symptoms are well controlled.  He has seen neurosurgery in the past but is not interested in surgery at this point. 4.  COPD.  Currently on Asmanex daily.  Symptoms are stable. 5.  Allergic rhinitis.  Currently on Singulair and Nasacort.  Symptoms are stable. 6.  Anxiety.  Several year history.  Takes 0.25 to 0.5 mg of Klonopin as needed.  Only needs to do this a few times per month. 7.  Erectile dysfunction.  Uses Viagra 100 mg as needed. 8.  GERD.  Several year history.  On Nexium 20 mg daily.  Symptoms are stable.  ROS: Per HPI  PMH: He reports that he has quit smoking. He smoked 1.50 packs per day. He has never used smokeless tobacco. He reports that he drank alcohol. He reports that he does not use drugs.  Objective:  Physical Exam: BP (!) 150/102 (BP Location: Left Arm, Patient Position: Sitting, Cuff Size: Normal)   Pulse 94   Temp 98.5 F (36.9 C) (Oral)   Ht 6' (1.829 m)   Wt 201 lb 9.6 oz (91.4 kg)   SpO2 96%   BMI 27.34 kg/m   Gen: NAD, resting comfortably CV: RRR with no murmurs appreciated Pulm: NWOB, CTAB with no crackles, wheezes, or rhonchi GI: Normal bowel sounds present. Soft, Nontender, Nondistended. MSK: No edema, cyanosis, or clubbing noted Skin: Warm,  dry Neuro: Grossly normal, moves all extremities Psych: Normal affect and thought content  Assessment/Plan:  Essential hypertension Elevated today, however patient has home blood pressure readings at goal.  We will continue Benicar 40 mg daily he will continue monitoring at home.  Discussed importance of regular exercise and low-sodium diet.  He will follow-up soon for his CPE.  Inguinal hernia unilateral, non-recurrent, right Stable.  Will be undergoing surgery in a few weeks.  PAF (paroxysmal atrial fibrillation) (HCC) Regular rhythm today.  Defer further management to cardiology.  Chronic low back pain with sciatica No red flags.  Continue Robaxin, meloxicam, and tramadol.  Consider referral to sports medicine in the near future.  COPD (chronic obstructive pulmonary disease) (HCC) Stable exam.  Continue Asmanex inhaler.  Encouraged cessation from vaping nicotine.  Allergic rhinitis Stable.  Continue Singulair and Nasacort.  Anxiety Stable.  Continue Klonopinhe does not need a refill today.  Erectile dysfunction Stable.  Continue Viagra 100 mg daily as needed.  GERD (gastroesophageal reflux disease) Stable.  Continue Nexium 20 mg daily.  Preventative Healthcare Patient was instructed to return soon for CPE. Health Maintenance Due  Topic Date Due  . Hepatitis C Screening  Jul 09, 1955  . HIV Screening  05/18/1972  . TETANUS/TDAP  05/18/1976     M. [name], MD 03/04/2018 11:56 AM

## 2018-03-04 NOTE — Assessment & Plan Note (Signed)
Stable.  Continue Nexium  20mg daily

## 2018-03-04 NOTE — Assessment & Plan Note (Signed)
Regular rhythm today.  Defer further management to cardiology.

## 2018-03-18 DIAGNOSIS — K4090 Unilateral inguinal hernia, without obstruction or gangrene, not specified as recurrent: Secondary | ICD-10-CM | POA: Diagnosis not present

## 2018-03-28 ENCOUNTER — Telehealth (HOSPITAL_COMMUNITY): Payer: Self-pay | Admitting: *Deleted

## 2018-03-28 NOTE — Telephone Encounter (Signed)
Patient wanted afib episode from last night recorded in chart as this was his first episode since start multaq. He was emotionally upset over something on TV prior to episode. It resolved in about 1 hour after taking antianxiety medication.

## 2018-03-30 ENCOUNTER — Encounter: Payer: Self-pay | Admitting: Family Medicine

## 2018-04-22 ENCOUNTER — Encounter: Payer: Self-pay | Admitting: Family Medicine

## 2018-04-22 ENCOUNTER — Ambulatory Visit (INDEPENDENT_AMBULATORY_CARE_PROVIDER_SITE_OTHER): Payer: BLUE CROSS/BLUE SHIELD | Admitting: Family Medicine

## 2018-04-22 VITALS — BP 142/84 | HR 91 | Temp 98.6°F | Ht 72.0 in | Wt 209.4 lb

## 2018-04-22 DIAGNOSIS — N529 Male erectile dysfunction, unspecified: Secondary | ICD-10-CM | POA: Diagnosis not present

## 2018-04-22 DIAGNOSIS — Z1159 Encounter for screening for other viral diseases: Secondary | ICD-10-CM | POA: Diagnosis not present

## 2018-04-22 DIAGNOSIS — Z0001 Encounter for general adult medical examination with abnormal findings: Secondary | ICD-10-CM | POA: Diagnosis not present

## 2018-04-22 DIAGNOSIS — K219 Gastro-esophageal reflux disease without esophagitis: Secondary | ICD-10-CM

## 2018-04-22 DIAGNOSIS — M5440 Lumbago with sciatica, unspecified side: Secondary | ICD-10-CM

## 2018-04-22 DIAGNOSIS — I480 Paroxysmal atrial fibrillation: Secondary | ICD-10-CM

## 2018-04-22 DIAGNOSIS — G8929 Other chronic pain: Secondary | ICD-10-CM

## 2018-04-22 DIAGNOSIS — J309 Allergic rhinitis, unspecified: Secondary | ICD-10-CM

## 2018-04-22 DIAGNOSIS — I10 Essential (primary) hypertension: Secondary | ICD-10-CM | POA: Diagnosis not present

## 2018-04-22 DIAGNOSIS — Z23 Encounter for immunization: Secondary | ICD-10-CM | POA: Diagnosis not present

## 2018-04-22 DIAGNOSIS — Z13220 Encounter for screening for lipoid disorders: Secondary | ICD-10-CM

## 2018-04-22 DIAGNOSIS — Z114 Encounter for screening for human immunodeficiency virus [HIV]: Secondary | ICD-10-CM

## 2018-04-22 DIAGNOSIS — F419 Anxiety disorder, unspecified: Secondary | ICD-10-CM

## 2018-04-22 DIAGNOSIS — J449 Chronic obstructive pulmonary disease, unspecified: Secondary | ICD-10-CM | POA: Diagnosis not present

## 2018-04-22 LAB — COMPREHENSIVE METABOLIC PANEL
ALBUMIN: 4.3 g/dL (ref 3.5–5.2)
ALK PHOS: 79 U/L (ref 39–117)
ALT: 31 U/L (ref 0–53)
AST: 21 U/L (ref 0–37)
BILIRUBIN TOTAL: 0.3 mg/dL (ref 0.2–1.2)
BUN: 19 mg/dL (ref 6–23)
CO2: 27 mEq/L (ref 19–32)
Calcium: 9.6 mg/dL (ref 8.4–10.5)
Chloride: 104 mEq/L (ref 96–112)
Creatinine, Ser: 0.96 mg/dL (ref 0.40–1.50)
GFR: 84.66 mL/min (ref >60.00)
Glucose, Bld: 101 mg/dL — ABNORMAL HIGH (ref 70–99)
POTASSIUM: 4.5 meq/L (ref 3.5–5.1)
SODIUM: 139 meq/L (ref 135–145)
TOTAL PROTEIN: 6.9 g/dL (ref 6.0–8.3)

## 2018-04-22 LAB — CBC
HEMATOCRIT: 42.3 % (ref 39.0–52.0)
Hemoglobin: 14.3 g/dL (ref 13.0–17.0)
MCHC: 33.8 g/dL (ref 30.0–36.0)
MCV: 91.4 fl (ref 78.0–100.0)
Platelets: 186 10*3/uL (ref 150.0–400.0)
RBC: 4.62 Mil/uL (ref 4.22–5.81)
RDW: 12.6 % (ref 11.5–15.5)
WBC: 5.6 10*3/uL (ref 4.0–10.5)

## 2018-04-22 LAB — LIPID PANEL
Cholesterol: 228 mg/dL — ABNORMAL HIGH (ref 0–200)
HDL: 44.7 mg/dL (ref >39.00)
LDL Cholesterol: 156 mg/dL — ABNORMAL HIGH (ref 0–99)
NonHDL: 183.79
Total CHOL/HDL Ratio: 5
Triglycerides: 139 mg/dL (ref 0.0–149.0)
VLDL: 27.8 mg/dL (ref 0.0–40.0)

## 2018-04-22 LAB — TSH: TSH: 1.29 u[IU]/mL (ref 0.35–4.50)

## 2018-04-22 LAB — VITAMIN B12: Vitamin B-12: 743 pg/mL (ref 211–911)

## 2018-04-22 LAB — MAGNESIUM: Magnesium: 1.9 mg/dL (ref 1.5–2.5)

## 2018-04-22 MED ORDER — BUSPIRONE HCL 7.5 MG PO TABS: 7.5000 mg | ORAL_TABLET | Freq: Two times a day (BID) | ORAL | 5 refills | Status: DC

## 2018-04-22 MED ORDER — SILDENAFIL CITRATE 100 MG PO TABS: 100.0000 mg | ORAL_TABLET | Freq: Every day | ORAL | 2 refills | Status: DC | PRN

## 2018-04-22 NOTE — Assessment & Plan Note (Signed)
Elevated today however home blood pressure readings are at goal.  Continue Benicar 40 mg daily.  Continue home monitoring.  Check CBC, CMP, and TSH.

## 2018-04-22 NOTE — Patient Instructions (Signed)
It was very nice to see you today!  Please start buspirone.  Please take 1 pill twice daily.  Please try cutting back on your sodas.  We will give your tetanus shot today.  We will check blood work today.  Please come back to see me in 3 to 6 months, or sooner as needed.  Take care, Dr [name]   [facility] Eating Plan DASH stands for "Dietary Approaches to Stop Hypertension." The DASH eating plan is a healthy eating plan that has been shown to reduce high blood pressure (hypertension). It may also reduce your risk for type 2 diabetes, heart disease, and stroke. The DASH eating plan may also help with weight loss. What are tips for following this plan?  General guidelines  Avoid eating more than 2,300 mg (milligrams) of salt (sodium) a day. If you have hypertension, you may need to reduce your sodium intake to 1,500 mg a day.  Limit alcohol intake to no more than 1 drink a day for nonpregnant women and 2 drinks a day for men. One drink equals 12 oz of beer, 5 oz of wine, or 1 oz of hard liquor.  Work with your health care provider to maintain a healthy body weight or to lose weight. Ask what an ideal weight is for you.  Get at least 30 minutes of exercise that causes your heart to beat faster (aerobic exercise) most days of the week. Activities may include walking, swimming, or biking.  Work with your health care provider or diet and nutrition specialist (dietitian) to adjust your eating plan to your individual calorie needs. Reading food labels   Check food labels for the amount of sodium per serving. Choose foods with less than 5 percent of the Daily Value of sodium. Generally, foods with less than 300 mg of sodium per serving fit into this eating plan.  To find whole grains, look for the word "whole" as the first word in the ingredient list. Shopping  Buy products labeled as "low-sodium" or "no salt added."  Buy fresh foods. Avoid canned foods and premade or frozen  meals. Cooking  Avoid adding salt when cooking. Use salt-free seasonings or herbs instead of table salt or sea salt. Check with your health care provider or pharmacist before using salt substitutes.  Do not fry foods. Cook foods using healthy methods such as baking, boiling, grilling, and broiling instead.  Cook with heart-healthy oils, such as olive, canola, soybean, or sunflower oil. Meal planning  Eat a balanced diet that includes: ? 5 or more servings of fruits and vegetables each day. At each meal, try to fill half of your plate with fruits and vegetables. ? Up to 6-8 servings of whole grains each day. ? Less than 6 oz of lean meat, poultry, or fish each day. A 3-oz serving of meat is about the same size as a deck of cards. One egg equals 1 oz. ? 2 servings of low-fat dairy each day. ? A serving of nuts, seeds, or beans 5 times each week. ? Heart-healthy fats. Healthy fats called Omega-3 fatty acids are found in foods such as flaxseeds and coldwater fish, like sardines, salmon, and mackerel.  Limit how much you eat of the following: ? Canned or prepackaged foods. ? Food that is high in trans fat, such as fried foods. ? Food that is high in saturated fat, such as fatty meat. ? Sweets, desserts, sugary drinks, and other foods with added sugar. ? Full-fat dairy products.  Do not  salt foods before eating.  Try to eat at least 2 vegetarian meals each week.  Eat more home-cooked food and less restaurant, buffet, and fast food.  When eating at a restaurant, ask that your food be prepared with less salt or no salt, if possible. What foods are recommended? The items listed may not be a complete list. Talk with your dietitian about what dietary choices are best for you. Grains Whole-grain or whole-wheat bread. Whole-grain or whole-wheat pasta. Brown rice. [place]. Bulgur. Whole-grain and low-sodium cereals. Pita bread. Low-fat, low-sodium crackers. Whole-wheat flour  tortillas. Vegetables Fresh or frozen vegetables (raw, steamed, roasted, or grilled). Low-sodium or reduced-sodium tomato and vegetable juice. Low-sodium or reduced-sodium tomato sauce and tomato paste. Low-sodium or reduced-sodium canned vegetables. Fruits All fresh, dried, or frozen fruit. Canned fruit in natural juice (without added sugar). Meat and other protein foods Skinless chicken or [place]. Ground chicken or [place]. Pork with fat trimmed off. Fish and seafood. Egg whites. Dried beans, peas, or lentils. Unsalted nuts, nut butters, and seeds. Unsalted canned beans. Lean cuts of beef with fat trimmed off. Low-sodium, lean deli meat. Dairy Low-fat (1%) or fat-free (skim) milk. Fat-free, low-fat, or reduced-fat cheeses. Nonfat, low-sodium ricotta or cottage cheese. Low-fat or nonfat yogurt. Low-fat, low-sodium cheese. Fats and oils Soft margarine without trans fats. Vegetable oil. Low-fat, reduced-fat, or light mayonnaise and salad dressings (reduced-sodium). Canola, safflower, olive, soybean, and sunflower oils. Avocado. Seasoning and other foods Herbs. Spices. Seasoning mixes without salt. Unsalted popcorn and pretzels. Fat-free sweets. What foods are not recommended? The items listed may not be a complete list. Talk with your dietitian about what dietary choices are best for you. Grains Baked goods made with fat, such as croissants, muffins, or some breads. Dry pasta or rice meal packs. Vegetables Creamed or fried vegetables. Vegetables in a cheese sauce. Regular canned vegetables (not low-sodium or reduced-sodium). Regular canned tomato sauce and paste (not low-sodium or reduced-sodium). Regular tomato and vegetable juice (not low-sodium or reduced-sodium). Angie Fava. Olives. Fruits Canned fruit in a light or heavy syrup. Fried fruit. Fruit in cream or butter sauce. Meat and other protein foods Fatty cuts of meat. Ribs. Fried meat. Berniece Salines. Sausage. Bologna and other processed lunch meats.  Salami. Fatback. Hotdogs. Bratwurst. Salted nuts and seeds. Canned beans with added salt. Canned or smoked fish. Whole eggs or egg yolks. Chicken or [place] with skin. Dairy Whole or 2% milk, cream, and half-and-half. Whole or full-fat cream cheese. Whole-fat or sweetened yogurt. Full-fat cheese. Nondairy creamers. Whipped toppings. Processed cheese and cheese spreads. Fats and oils Butter. Stick margarine. Lard. Shortening. Ghee. Bacon fat. Tropical oils, such as coconut, palm kernel, or palm oil. Seasoning and other foods Salted popcorn and pretzels. Onion salt, garlic salt, seasoned salt, table salt, and sea salt. Worcestershire sauce. Tartar sauce. Barbecue sauce. Teriyaki sauce. Soy sauce, including reduced-sodium. Steak sauce. Canned and packaged gravies. Fish sauce. Oyster sauce. Cocktail sauce. Horseradish that you find on the shelf. Ketchup. Mustard. Meat flavorings and tenderizers. Bouillon cubes. Hot sauce and Tabasco sauce. Premade or packaged marinades. Premade or packaged taco seasonings. Relishes. Regular salad dressings. Where to find more information:  National Heart, Lung, and [place]: https://wilson-eaton.com/  American Heart Association: www.heart.org Summary  The DASH eating plan is a healthy eating plan that has been shown to reduce high blood pressure (hypertension). It may also reduce your risk for type 2 diabetes, heart disease, and stroke.  With the DASH eating plan, you should limit salt (sodium) intake to  2,300 mg a day. If you have hypertension, you may need to reduce your sodium intake to 1,500 mg a day.  When on the DASH eating plan, aim to eat more fresh fruits and vegetables, whole grains, lean proteins, low-fat dairy, and heart-healthy fats.  Work with your health care provider or diet and nutrition specialist (dietitian) to adjust your eating plan to your individual calorie needs. This information is not intended to replace advice given to you by your health  care provider. Make sure you discuss any questions you have with your health care provider. Document Released: 03/16/2011 Document Revised: 03/20/2016 Document Reviewed: 03/20/2016 Elsevier Interactive Patient Education  2019 [place] Years, Male Preventive care refers to lifestyle choices and visits with your health care provider that can promote health and wellness. What does preventive care include?   A yearly physical exam. This is also called an annual well check.  Dental exams once or twice a year.  Routine eye exams. Ask your health care provider how often you should have your eyes checked.  Personal lifestyle choices, including: ? Daily care of your teeth and gums. ? Regular physical activity. ? Eating a healthy diet. ? Avoiding tobacco and drug use. ? Limiting alcohol use. ? Practicing safe sex. ? Taking low-dose aspirin every day starting at age 65. What happens during an annual well check? The services and screenings done by your health care provider during your annual well check will depend on your age, overall health, lifestyle risk factors, and family history of disease. Counseling Your health care provider may ask you questions about your:  Alcohol use.  Tobacco use.  Drug use.  Emotional well-being.  Home and relationship well-being.  Sexual activity.  Eating habits.  Work and work Statistician. Screening You may have the following tests or measurements:  Height, weight, and BMI.  Blood pressure.  Lipid and cholesterol levels. These may be checked every 5 years, or more frequently if you are over 13 years old.  Skin check.  Lung cancer screening. You may have this screening every year starting at age 94 if you have a 30-pack-year history of smoking and currently smoke or have quit within the past 15 years.  Colorectal cancer screening. All adults should have this screening starting at age 43 and continuing until age  15. Your health care provider may recommend screening at age 35. You will have tests every 1-10 years, depending on your results and the type of screening test. People at increased risk should start screening at an earlier age. Screening tests may include: ? Guaiac-based fecal occult blood testing. ? Fecal immunochemical test (FIT). ? Stool DNA test. ? Virtual colonoscopy. ? Sigmoidoscopy. During this test, a flexible tube with a tiny camera (sigmoidoscope) is used to examine your rectum and lower colon. The sigmoidoscope is inserted through your anus into your rectum and lower colon. ? Colonoscopy. During this test, a long, thin, flexible tube with a tiny camera (colonoscope) is used to examine your entire colon and rectum.  Prostate cancer screening. Recommendations will vary depending on your family history and other risks.  Hepatitis C blood test.  Hepatitis B blood test.  Sexually transmitted disease (STD) testing.  Diabetes screening. This is done by checking your blood sugar (glucose) after you have not eaten for a while (fasting). You may have this done every 1-3 years. Discuss your test results, treatment options, and if necessary, the need for more tests with your health care provider.  Vaccines Your health care provider may recommend certain vaccines, such as:  Influenza vaccine. This is recommended every year.  Tetanus, diphtheria, and acellular pertussis (Tdap, Td) vaccine. You may need a Td booster every 10 years.  Varicella vaccine. You may need this if you have not been vaccinated.  Zoster vaccine. You may need this after age 48.  Measles, mumps, and rubella (MMR) vaccine. You may need at least one dose of MMR if you were born in 1957 or later. You may also need a second dose.  Pneumococcal 13-valent conjugate (PCV13) vaccine. You may need this if you have certain conditions and have not been vaccinated.  Pneumococcal polysaccharide (PPSV23) vaccine. You may need one  or two doses if you smoke cigarettes or if you have certain conditions.  Meningococcal vaccine. You may need this if you have certain conditions.  Hepatitis A vaccine. You may need this if you have certain conditions or if you travel or work in places where you may be exposed to hepatitis A.  Hepatitis B vaccine. You may need this if you have certain conditions or if you travel or work in places where you may be exposed to hepatitis B.  Haemophilus influenzae type b (Hib) vaccine. You may need this if you have certain risk factors. Talk to your health care provider about which screenings and vaccines you need and how often you need them. This information is not intended to replace advice given to you by your health care provider. Make sure you discuss any questions you have with your health care provider. Document Released: 04/23/2015 Document Revised: 05/17/2017 Document Reviewed: 01/26/2015 Elsevier Interactive Patient Education  2019 [employer].

## 2018-04-22 NOTE — Assessment & Plan Note (Signed)
Stable.  Continue management per cardiology. 

## 2018-04-22 NOTE — Progress Notes (Signed)
Subjective:  John [name] is a 63 y.o. male who presents today for his annual comprehensive physical exam.    HPI:  He has no acute complaints today.   His stable, chronic medical conditions are outlined below:  # Hypertension with whitecoat hypertension.   - On olmesartan 40mg  daily and tolerating well.   # Paroxysmal A. fib.  - Follows with cardiology.  On Multaq 400mg  bid  #Chronic low back pain with sciatica secondary to herniated disc.  - Currently on meloxicam 15 mg daily, Robaxin 500 mg daily as needed, and tramadol 25 to 50 mg 3 times daily as needed.  Medications help with pain.    # COPD.   - Stopped asmanex  # Allergic rhinitis.  - Currently on Singulair and Nasacort.  Symptoms are stable.  # Anxiety.   -  Takes 0.25 to 0.5 mg of Klonopin as needed.   - Has been needing to use more lately and is interested in alternative medications.   # Erectile dysfunction.   - Uses Viagra 100 mg as needed.   #GERD.   - On Nexium 20mg  qd  Lifestyle Diet: No specific diets. Drinks a lot of soda - about 6L per week.  Exercise: Does not regularly exercise.   Depression screen PHQ 2/9 04/22/2018  Decreased Interest 0  Down, Depressed, Hopeless 0  PHQ - 2 Score 0    Health Maintenance Due  Topic Date Due  . Hepatitis C Screening  10-28-1955  . HIV Screening  05/18/1972  . TETANUS/TDAP  05/18/1976     ROS: Per HPI, otherwise a complete review of systems was negative.   PMH:  The following were reviewed and entered/updated in epic: Past Medical History:  Diagnosis Date  . Afib ([place])   . Anxiety and depression   . Asthma   . Emphysema of lung ([place])   . GERD (gastroesophageal reflux disease)   . Hernia    right inguinal  . History of bronchitis   . Hyperlipidemia   . Hypertension   . Morton's neuroma   . OA (osteoarthritis)    neck  . Obstructive sleep apnea on CPAP   . Pancreatitis   . Torn meniscus    Left   Patient Active Problem List   Diagnosis  Date Noted  . Essential hypertension 03/04/2018  . Chronic low back pain with sciatica 03/04/2018  . COPD (chronic obstructive pulmonary disease) ([place]) 03/04/2018  . Allergic rhinitis 03/04/2018  . Anxiety 03/04/2018  . Erectile dysfunction 03/04/2018  . GERD (gastroesophageal reflux disease) 03/04/2018  . PAF (paroxysmal atrial fibrillation) ([place]) 03/28/2017  . Inguinal hernia unilateral, non-recurrent, right 04/05/2011   Past Surgical History:  Procedure Laterality Date  . BONE CYST EXCISION  1991   left wrist  . COLONOSCOPY    . HERNIA REPAIR  04/14/11   right inguinal  . HERNIA REPAIR     left inguinal, december 2020    Family History  Problem Relation Age of Onset  . Hypertension Mother   . Dementia Other   . Hypertension Sister   . Hypertension Brother     Medications- reviewed and updated Current Outpatient Medications  Medication Sig Dispense Refill  . cholecalciferol (VITAMIN D3) 25 MCG (1000 UT) tablet Take 1,000 Units by mouth 2 (two) times daily.    . clonazePAM (KLONOPIN) 0.5 MG tablet Take 0.5 mg by mouth as needed.  0  . Coenzyme Q10 (CO Q 10) 100 MG CAPS Take by mouth.    Marland Kitchen  esomeprazole (NEXIUM) 20 MG capsule Take 20 mg by mouth daily at 12 noon.    . meloxicam (MOBIC) 15 MG tablet Take 15 mg by mouth daily.    . methocarbamol (ROBAXIN) 500 MG tablet Take 500 mg by mouth as needed for muscle spasms.    . montelukast (SINGULAIR) 10 MG tablet Take 10 mg by mouth every morning.    . MULTAQ 400 MG tablet TAKE 1 TABLET (400 MG TOTAL) BY MOUTH 2 (TWO) TIMES DAILY WITH A MEAL. 180 tablet 1  . Multiple Vitamins-Minerals (MULTIVITAMIN WITH MINERALS) tablet Take 1 tablet by mouth daily.      Marland Kitchen olmesartan (BENICAR) 40 MG tablet Take 40 mg by mouth daily.    . Omega-3 Fatty Acids (FISH OIL) 1200 MG CAPS Take by mouth.    . Plant Sterol Stanol-Pantethine 300-100 MG CAPS Take by mouth.    . sildenafil (VIAGRA) 100 MG tablet Take 1 tablet (100 mg total) by mouth daily as  needed for erectile dysfunction. 20 tablet 2  . Specialty Vitamins Products (MAGNESIUM, AMINO ACID CHELATE,) 133 MG tablet Take 1 tablet by mouth 2 (two) times a week.     . traMADol (ULTRAM) 50 MG tablet Take 25-50 mg by mouth every morning.    . Triamcinolone Acetonide (NASACORT ALLERGY 24HR NA) Place into the nose.    . Turmeric 450 MG CAPS Take by mouth.    . vitamin B-12 (CYANOCOBALAMIN) 100 MCG tablet Take 50 mcg by mouth daily.      . vitamin E 400 UNIT capsule Take 400 Units by mouth daily.    . busPIRone (BUSPAR) 7.5 MG tablet Take 1 tablet (7.5 mg total) by mouth 2 (two) times daily. 60 tablet 5   No current facility-administered medications for this visit.     Allergies-reviewed and updated No Known Allergies  Social History   Socioeconomic History  . Marital status: Divorced    Spouse name: Not on file  . Number of children: Not on file  . Years of education: Not on file  . Highest education level: Not on file  Occupational History  . Not on file  Social Needs  . Financial resource strain: Not on file  . Food insecurity:    Worry: Not on file    Inability: Not on file  . Transportation needs:    Medical: Not on file    Non-medical: Not on file  Tobacco Use  . Smoking status: Former Smoker    Packs/day: 1.50  . Smokeless tobacco: Never Used  Substance and Sexual Activity  . Alcohol use: Not Currently  . Drug use: No  . Sexual activity: Yes    Partners: Female  Lifestyle  . Physical activity:    Days per week: Not on file    Minutes per session: Not on file  . Stress: Not on file  Relationships  . Social connections:    Talks on phone: Not on file    Gets together: Not on file    Attends religious service: Not on file    Active member of club or organization: Not on file    Attends meetings of clubs or organizations: Not on file    Relationship status: Not on file  Other Topics Concern  . Not on file  Social History Narrative  . Not on file     Objective:  Physical Exam: BP (!) 142/84 (BP Location: Left Arm, Patient Position: Sitting, Cuff Size: Normal)   Pulse 91  Temp 98.6 F (37 C) (Oral)   Ht 6' (1.829 m)   Wt 209 lb 6.4 oz (95 kg)   SpO2 98%   BMI 28.40 kg/m   Body mass index is 28.4 kg/m. Wt Readings from Last 3 Encounters:  04/22/18 209 lb 6.4 oz (95 kg)  03/04/18 201 lb 9.6 oz (91.4 kg)  02/11/18 205 lb (93 kg)   Gen: NAD, resting comfortably HEENT: TMs normal bilaterally. OP clear. No thyromegaly noted.  CV: RRR with no murmurs appreciated Pulm: NWOB, CTAB with no crackles, wheezes, or rhonchi GI: Normal bowel sounds present. Soft, Nontender, Nondistended. MSK: no edema, cyanosis, or clubbing noted Skin: warm, dry Neuro: CN2-12 grossly intact. Strength 5/5 in upper and lower extremities. Reflexes symmetric and intact bilaterally.  Psych: Normal affect and thought content  Assessment/Plan:  PAF (paroxysmal atrial fibrillation) (HCC) Stable.  Continue management per cardiology.  GERD (gastroesophageal reflux disease) Stable.  Continue Nexium 20 mg daily.  Essential hypertension Elevated today however home blood pressure readings are at goal.  Continue Benicar 40 mg daily.  Continue home monitoring.  Check CBC, CMP, and TSH.  Erectile dysfunction Stable.  Continue sildenafil as needed.  COPD (chronic obstructive pulmonary disease) (HCC) Stable off Asmanex.  Chronic low back pain with sciatica No red flags.  Continue meloxicam, Robaxin, and tramadol.  Anxiety We will start BuSpar 7.5 mg twice daily.  Continue current dose of Klonopin.  Follow-up with me in 3 to 6 months.  Allergic rhinitis Stable on Singulair and Nasacort.   Preventative Healthcare: Tdap given. Check HIV and Hep C.  Check lipid panel.   Patient Counseling(The following topics were reviewed and/or handout was given):  -Nutrition: Stressed importance of moderation in sodium/caffeine intake, saturated fat and cholesterol,  caloric balance, sufficient intake of fresh fruits, vegetables, and fiber.  -Stressed the importance of regular exercise.   -Substance Abuse: Discussed cessation/primary prevention of tobacco, alcohol, or other drug use; driving or other dangerous activities under the influence; availability of treatment for abuse.   -Injury prevention: Discussed safety belts, safety helmets, smoke detector, smoking near bedding or upholstery.   -Sexuality: Discussed sexually transmitted diseases, partner selection, use of condoms, avoidance of unintended pregnancy and contraceptive alternatives.   -Dental health: Discussed importance of regular tooth brushing, flossing, and dental visits.  -Health maintenance and immunizations reviewed. Please refer to Health maintenance section.  Return to care in 1 year for next preventative visit.   Algis Greenhouse. [name], MD 04/22/2018 2:19 PM

## 2018-04-22 NOTE — Assessment & Plan Note (Signed)
Stable.  Continue Nexium  20mg daily

## 2018-04-22 NOTE — Assessment & Plan Note (Signed)
Stable on Singulair and Nasacort.

## 2018-04-22 NOTE — Assessment & Plan Note (Signed)
We will start BuSpar 7.5 mg twice daily.  Continue current dose of Klonopin.  Follow-up with me in 3 to 6 months.

## 2018-04-22 NOTE — Assessment & Plan Note (Signed)
Stable off Asmanex.

## 2018-04-22 NOTE — Assessment & Plan Note (Signed)
No red flags.  Continue meloxicam, Robaxin, and tramadol.

## 2018-04-22 NOTE — Assessment & Plan Note (Signed)
Stable. Continue sildenafil as needed. 

## 2018-04-23 ENCOUNTER — Encounter: Payer: Self-pay | Admitting: Family Medicine

## 2018-04-23 DIAGNOSIS — E785 Hyperlipidemia, unspecified: Secondary | ICD-10-CM | POA: Insufficient documentation

## 2018-04-23 DIAGNOSIS — R739 Hyperglycemia, unspecified: Secondary | ICD-10-CM | POA: Insufficient documentation

## 2018-04-23 LAB — HEPATITIS C ANTIBODY
Hepatitis C Ab: NONREACTIVE
SIGNAL TO CUT-OFF: 0 (ref <1.00)

## 2018-04-23 LAB — HIV ANTIBODY (ROUTINE TESTING W REFLEX): HIV 1&2 Ab, 4th Generation: NONREACTIVE

## 2018-04-23 NOTE — Progress Notes (Signed)
Please inform patient of the following:  His blood sugar is borderline. Do not need to start medications, but he should work on diet and exercise and we can recheck in a year or so.  His cholesterol is elevated. I know he has refused statins in the past - would recommend working on diet and exercise as above. He can come in for OV to discuss other possible meds.  Other labs are all normal.   Enrica Corliss M. [name], MD 04/23/2018 3:03 PM

## 2018-05-11 ENCOUNTER — Other Ambulatory Visit (HOSPITAL_COMMUNITY): Payer: Self-pay | Admitting: Nurse Practitioner

## 2018-05-14 ENCOUNTER — Other Ambulatory Visit: Payer: Self-pay

## 2018-05-14 MED ORDER — BUSPIRONE HCL 7.5 MG PO TABS: 7.5000 mg | ORAL_TABLET | Freq: Two times a day (BID) | ORAL | 1 refills | Status: DC

## 2018-06-13 ENCOUNTER — Other Ambulatory Visit: Payer: Self-pay

## 2018-06-13 MED ORDER — OLMESARTAN MEDOXOMIL 40 MG PO TABS: 40.0000 mg | ORAL_TABLET | Freq: Every day | ORAL | 3 refills | Status: DC

## 2018-07-14 ENCOUNTER — Other Ambulatory Visit: Payer: Self-pay | Admitting: Family Medicine

## 2018-07-15 DIAGNOSIS — H0102B Squamous blepharitis left eye, upper and lower eyelids: Secondary | ICD-10-CM | POA: Diagnosis not present

## 2018-07-15 DIAGNOSIS — H02831 Dermatochalasis of right upper eyelid: Secondary | ICD-10-CM | POA: Diagnosis not present

## 2018-07-15 DIAGNOSIS — H0102A Squamous blepharitis right eye, upper and lower eyelids: Secondary | ICD-10-CM | POA: Diagnosis not present

## 2018-07-15 DIAGNOSIS — H43811 Vitreous degeneration, right eye: Secondary | ICD-10-CM | POA: Diagnosis not present

## 2018-07-15 NOTE — Telephone Encounter (Signed)
Last OV 04/22/2018 Last refill-  Not by Dr. [name] Next OV 07/22/2018  OK to refill?

## 2018-07-15 NOTE — Telephone Encounter (Signed)
Ok with me. Please place any necessary orders. 

## 2018-07-22 ENCOUNTER — Encounter: Payer: Self-pay | Admitting: Family Medicine

## 2018-07-22 ENCOUNTER — Ambulatory Visit (INDEPENDENT_AMBULATORY_CARE_PROVIDER_SITE_OTHER): Payer: BLUE CROSS/BLUE SHIELD | Admitting: Family Medicine

## 2018-07-22 DIAGNOSIS — L578 Other skin changes due to chronic exposure to nonionizing radiation: Secondary | ICD-10-CM | POA: Diagnosis not present

## 2018-07-22 DIAGNOSIS — F419 Anxiety disorder, unspecified: Secondary | ICD-10-CM | POA: Diagnosis not present

## 2018-07-22 DIAGNOSIS — E785 Hyperlipidemia, unspecified: Secondary | ICD-10-CM | POA: Diagnosis not present

## 2018-07-22 DIAGNOSIS — I10 Essential (primary) hypertension: Secondary | ICD-10-CM | POA: Diagnosis not present

## 2018-07-22 DIAGNOSIS — N50812 Left testicular pain: Secondary | ICD-10-CM

## 2018-07-22 NOTE — Assessment & Plan Note (Signed)
Continue BuSpar 7.5 mg in the morning.  Will half evening dose to 3.75 mg to see if this helps with his insomnia.  Follow-up in 3 to 6 months.  Consider increasing morning dose depending on response to above regimen.

## 2018-07-22 NOTE — Assessment & Plan Note (Signed)
Reported blood pressures are in range.  Continue olmesartan 40 mg daily.

## 2018-07-22 NOTE — Progress Notes (Signed)
    Chief Complaint:  John [name] is a 63 y.o. male who presents today for a virtual office visit with a chief complaint of anxiety follow up.   Assessment/Plan:  Essential hypertension Reported blood pressures are in range.  Continue olmesartan 40 mg daily.  Dyslipidemia Not on medications.  Check lipid panel in 3 to 6 months.  Anxiety Continue BuSpar 7.5 mg in the morning.  Will half evening dose to 3.75 mg to see if this helps with his insomnia.  Follow-up in 3 to 6 months.  Consider increasing morning dose depending on response to above regimen.  Left testicle pain No red flags.  We will follow-up with urology as discussed.  Sun damage skin Will place referral to dermatology.    Subjective:  HPI:  # Essential Hypertension - On olmesartan 40mg  daily and tolerating well - Home BPs: 110s/60s - ROS: No reported chest pain or shortness of breath  # Anxiety - On buspar 7.5mg  twice daily and klonopin 0.5mg  as needed - Had some insomnia with evening dose of buspar, but is otherwise tolerating well - ROS: No SI or HI  # Sun Damaged Skin -He has noticed a few rough patches of skin on his upper years.  Requests referral to dermatology for further evaluation.  # Dyslipidemia -Does not take statins.  Has been working on dietary modifications.  He has also been noticing some increased pain in his left testicle.  Thinks that this is related to his recent inguinal surgery repair.  He has already established a relationship with urology and will be following with them soon.  ROS: Per HPI  PMH: He reports that he has quit smoking. He smoked 1.50 packs per day. He has never used smokeless tobacco. He reports previous alcohol use. He reports that he does not use drugs.      Objective/Observations  Physical Exam: Gen: NAD, resting comfortably Pulm: Normal work of breathing Neuro: Grossly normal, moves all extremities Psych: Normal affect and thought content  Virtual Visit via  Video   I connected with Sohum Delillo Bessent on 07/22/18 at  9:40 AM EDT by a video enabled telemedicine application and verified that I am speaking with the correct person using two identifiers. I discussed the limitations of evaluation and management by telemedicine and the availability of in person appointments. The patient expressed understanding and agreed to proceed.   Patient location: Home Provider location: [place] participating in the virtual visit: Myself and Patient     Algis Greenhouse. [name], MD 07/22/2018 11:32 AM

## 2018-07-22 NOTE — Assessment & Plan Note (Signed)
Not on medications.  Check lipid panel in 3 to 6 months.

## 2018-08-05 DIAGNOSIS — H47323 Drusen of optic disc, bilateral: Secondary | ICD-10-CM | POA: Diagnosis not present

## 2018-08-05 DIAGNOSIS — H538 Other visual disturbances: Secondary | ICD-10-CM | POA: Diagnosis not present

## 2018-08-05 DIAGNOSIS — H0102B Squamous blepharitis left eye, upper and lower eyelids: Secondary | ICD-10-CM | POA: Diagnosis not present

## 2018-08-05 DIAGNOSIS — H0102A Squamous blepharitis right eye, upper and lower eyelids: Secondary | ICD-10-CM | POA: Diagnosis not present

## 2018-08-05 DIAGNOSIS — H43811 Vitreous degeneration, right eye: Secondary | ICD-10-CM | POA: Diagnosis not present

## 2018-08-07 ENCOUNTER — Encounter: Payer: Self-pay | Admitting: Family Medicine

## 2018-08-08 ENCOUNTER — Other Ambulatory Visit (HOSPITAL_COMMUNITY): Payer: Self-pay | Admitting: Nurse Practitioner

## 2018-08-19 ENCOUNTER — Ambulatory Visit: Payer: BLUE CROSS/BLUE SHIELD | Admitting: Sports Medicine

## 2018-08-25 NOTE — Progress Notes (Signed)
[name] [address] [place], [place] [address] Phone: [phone] Subjective:   I John [name] am serving as a Education administrator for Dr. Hulan [name].    CC: finger pain   [id number]  CORNELIO [name] is a 63 y.o. male coming in with complaint of left finger pain. Injection. History of trigger finger bilaterally. Right hand is doing better. Pain radiates into the hand. Finger is stiff in the morning. Pain with certain movements.  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 Afib ([place])   . Anxiety and depression   . Asthma   . Emphysema of lung ([place])   . GERD (gastroesophageal reflux disease)   . Hernia    right inguinal  . History of bronchitis   . Hyperlipidemia   . Hypertension   . Morton's neuroma   . OA (osteoarthritis)    neck  . Obstructive sleep apnea on CPAP   . Pancreatitis   . Torn meniscus    Left   Past Surgical History:  Procedure Laterality Date  . BONE CYST EXCISION  1991   left wrist  . COLONOSCOPY    . HERNIA REPAIR  04/14/11   right inguinal  . HERNIA REPAIR     left inguinal, december 2020   Social History   Socioeconomic History  . Marital status: Divorced    Spouse name: Not on file  . Number of children: Not on file  . Years of education: Not on file  . Highest education level: Not on file  Occupational History  . Not on file  Social Needs  . Financial resource strain: Not on file  . Food insecurity:    Worry: Not on file    Inability: Not on file  . Transportation needs:    Medical: Not on file    Non-medical: Not on file  Tobacco Use  . Smoking status: Former Smoker   Packs/day: 1.50  . Smokeless tobacco: Never Used  Substance and Sexual Activity  . Alcohol use: Not Currently  . Drug use: No  . Sexual activity: Yes    Partners: Female  Lifestyle  . Physical activity:    Days per week: Not on file    Minutes per session: Not on file  . Stress: Not on file  Relationships  . Social connections:    Talks on phone: Not on file    Gets together: Not on file    Attends religious service: Not on file    Active member of club or organization: Not on file    Attends meetings of clubs or organizations: Not on file    Relationship status: Not on file  Other Topics Concern  . Not on file  Social History Narrative  . Not on file   No Known Allergies Family History  Problem Relation Age of Onset  . Hypertension Mother   . Dementia Other   . Hypertension Sister   . Hypertension Brother      Current Outpatient Medications (Cardiovascular):  .  diltiazem (CARDIZEM) 30 MG tablet, Take one tablet by mouth every 4 hours AS NEEDED for A-fib HR > 100 as long as BP >  100. **NEEDS APPT .  MULTAQ 400 MG tablet, TAKE 1 TABLET (400 MG TOTAL) BY MOUTH 2 (TWO) TIMES DAILY WITH A MEAL. Marland Kitchen  olmesartan (BENICAR) 40 MG tablet, Take 1 tablet (40 mg total) by mouth daily. .  sildenafil (VIAGRA) 100 MG tablet, Take 1 tablet (100 mg total) by mouth daily as needed for erectile dysfunction.  Current Outpatient Medications (Respiratory):  .  montelukast (SINGULAIR) 10 MG tablet, Take 10 mg by mouth every morning. .  Triamcinolone Acetonide (NASACORT ALLERGY 24HR NA), Place into the nose.  Current Outpatient Medications (Analgesics):  .  meloxicam (MOBIC) 15 MG tablet, TAKE 1 TABLET BY MOUTH EVERY DAY .  traMADol (ULTRAM) 50 MG tablet, Take 25-50 mg by mouth every morning.  Current Outpatient Medications (Hematological):  .  vitamin B-12 (CYANOCOBALAMIN) 100 MCG tablet, Take 50 mcg by mouth daily.    Current Outpatient Medications (Other):  .  busPIRone (BUSPAR) 7.5 MG  tablet, Take 1 tablet (7.5 mg total) by mouth 2 (two) times daily. .  cholecalciferol (VITAMIN D3) 25 MCG (1000 UT) tablet, Take 1,000 Units by mouth 2 (two) times daily. .  clonazePAM (KLONOPIN) 0.5 MG tablet, Take 0.5 mg by mouth as needed. .  Coenzyme Q10 (CO Q 10) 100 MG CAPS, Take by mouth. .  esomeprazole (NEXIUM) 20 MG capsule, Take 20 mg by mouth daily at 12 noon. .  methocarbamol (ROBAXIN) 500 MG tablet, Take 500 mg by mouth as needed for muscle spasms. .  Multiple Vitamins-Minerals (MULTIVITAMIN WITH MINERALS) tablet, Take 1 tablet by mouth daily.   .  Omega-3 Fatty Acids (FISH OIL) 1200 MG CAPS, Take by mouth. .  Plant Sterol Stanol-Pantethine 300-100 MG CAPS, Take by mouth. Marland Kitchen  Specialty Vitamins Products (MAGNESIUM, AMINO ACID CHELATE,) 133 MG tablet, Take 1 tablet by mouth 2 (two) times a week.  .  Turmeric 450 MG CAPS, Take by mouth. .  vitamin E 400 UNIT capsule, Take 400 Units by mouth daily. Marland Kitchen  gabapentin (NEURONTIN) 100 MG capsule, Take 2 capsules (200 mg total) by mouth at bedtime.    Past medical history, social, surgical and family history all reviewed in electronic medical record.  No pertanent information unless stated regarding to the chief complaint.   Review of Systems:  No headache, visual changes, nausea, vomiting, diarrhea, constipation, dizziness, abdominal pain, skin rash, fevers, chills, night sweats, weight loss, swollen lymph nodes, body aches, joint swelling, muscle aches, chest pain, shortness of breath, mood changes.   Objective  Blood pressure (!) 158/80, pulse 99, height 6' (1.829 m), weight 199 lb (90.3 kg), SpO2 98 %.    General: No apparent distress alert and oriented x3 mood and affect normal, dressed appropriately.  HEENT: Pupils equal, extraocular movements intact  Respiratory: Patient's speak in full sentences and does not appear short of breath  Cardiovascular: No lower extremity edema, non tender, no erythema  Skin: Warm dry intact with no  signs of infection or rash on extremities or on axial skeleton.  Abdomen: Soft nontender  Neuro: Cranial nerves II through XII are intact, neurovascularly intact in all extremities with 2+ DTRs and 2+ pulses.  Lymph: No lymphadenopathy of posterior or anterior cervical chain or axillae bilaterally.  Gait normal with good balance and coordination.  MSK:  Non tender with full range of motion and good stability and symmetric strength and tone of shoulders, elbows, wrist, hip, knee and ankles bilaterally.  Left hand exam shows the patient does have triggering of the fourth finger  noted.  Patient does have tenderness at the A2 pulley and nodule felt.  Full grip strength noted.  Neurovascular intact distally.  Patient does have significant tightness of the neck felt.  Mild radicular symptoms with extension  Procedure: Real-time Ultrasound Guided Injection of left fourth flexor tendon sheath Device: GE Logiq Q7 Ultrasound guided injection is preferred based studies that show increased duration, increased effect, greater accuracy, decreased procedural pain, increased response rate, and decreased cost with ultrasound guided versus blind injection.  Verbal informed consent obtained.  Time-out conducted.  Noted no overlying erythema, induration, or other signs of local infection.  Skin prepped in a sterile fashion.  Local anesthesia: Topical Ethyl chloride.  With sterile technique and under real time ultrasound guidance: With a 25-gauge half inch needle injected with 0.5 cc of 0.5% Marcaine and 0.5 cc of Kenalog 40 mg this was in the flexor tendon sheath Completed without difficulty  Pain immediately resolved suggesting accurate placement of the medication.  Advised to call if fevers/chills, erythema, induration, drainage, or persistent bleeding.  Images permanently stored and available for review in the ultrasound unit.  Impression: Technically successful ultrasound guided injection.   Impression and  Recommendations:     This case required medical decision making of moderate complexity. The above documentation has been reviewed and is accurate and complete Lyndal Pulley, DO       Note: This dictation was prepared with Dragon dictation along with smaller phrase technology. Any transcriptional errors that result from this process are unintentional.

## 2018-08-26 ENCOUNTER — Ambulatory Visit: Payer: Self-pay

## 2018-08-26 ENCOUNTER — Other Ambulatory Visit: Payer: Self-pay

## 2018-08-26 ENCOUNTER — Ambulatory Visit: Payer: BLUE CROSS/BLUE SHIELD | Admitting: Family Medicine

## 2018-08-26 ENCOUNTER — Encounter: Payer: Self-pay | Admitting: Family Medicine

## 2018-08-26 VITALS — BP 158/80 | HR 99 | Ht 72.0 in | Wt 199.0 lb

## 2018-08-26 DIAGNOSIS — M79642 Pain in left hand: Secondary | ICD-10-CM | POA: Diagnosis not present

## 2018-08-26 DIAGNOSIS — M65342 Trigger finger, left ring finger: Secondary | ICD-10-CM | POA: Diagnosis not present

## 2018-08-26 DIAGNOSIS — M6530 Trigger finger, unspecified finger: Secondary | ICD-10-CM | POA: Insufficient documentation

## 2018-08-26 MED ORDER — GABAPENTIN 100 MG PO CAPS: 200.0000 mg | ORAL_CAPSULE | Freq: Every day | ORAL | 3 refills | Status: DC

## 2018-08-26 NOTE — Assessment & Plan Note (Signed)
Injected today.  Patient does know do a type of employment that increases her risk.  We discussed bracing at night, discussed over-the-counter medicines, we discussed other things.  Follow-up with again patient wants to avoid any surgical intervention.

## 2018-08-26 NOTE — Patient Instructions (Addendum)
Great to see you  Be safe Try wear ing the brace at night Gabapentin 1-2 pills at night to see how that does for the nerve pain  See me again in 6 weeks!

## 2018-09-08 ENCOUNTER — Other Ambulatory Visit: Payer: Self-pay | Admitting: Family Medicine

## 2018-09-09 NOTE — Telephone Encounter (Signed)
Please advise.  According to controlled substance database, last filled 07/22/2018 for 60 tablets.  Written by Dr. Osborne Casco.

## 2018-09-17 ENCOUNTER — Other Ambulatory Visit: Payer: Self-pay | Admitting: Family Medicine

## 2018-09-23 ENCOUNTER — Other Ambulatory Visit: Payer: Self-pay | Admitting: Family Medicine

## 2018-10-07 ENCOUNTER — Ambulatory Visit: Payer: BLUE CROSS/BLUE SHIELD | Admitting: Family Medicine

## 2018-10-23 ENCOUNTER — Other Ambulatory Visit: Payer: Self-pay

## 2018-10-23 MED ORDER — IPRATROPIUM BROMIDE 0.06 % NA SOLN: NASAL | 0 refills | Status: DC

## 2018-11-07 ENCOUNTER — Encounter: Payer: Self-pay | Admitting: Family Medicine

## 2018-11-07 ENCOUNTER — Other Ambulatory Visit (HOSPITAL_COMMUNITY): Payer: Self-pay | Admitting: Nurse Practitioner

## 2018-11-12 ENCOUNTER — Other Ambulatory Visit: Payer: Self-pay

## 2018-11-12 MED ORDER — HYDROXYZINE HCL 25 MG PO TABS: ORAL_TABLET | ORAL | 1 refills | Status: DC

## 2018-11-13 ENCOUNTER — Encounter: Payer: Self-pay | Admitting: Family Medicine

## 2018-11-14 ENCOUNTER — Other Ambulatory Visit: Payer: Self-pay

## 2018-11-14 MED ORDER — IPRATROPIUM BROMIDE 0.06 % NA SOLN: NASAL | 0 refills | Status: DC

## 2018-11-15 ENCOUNTER — Other Ambulatory Visit: Payer: Self-pay

## 2018-11-15 MED ORDER — TRAMADOL HCL 50 MG PO TABS: 50.0000 mg | ORAL_TABLET | Freq: Two times a day (BID) | ORAL | 0 refills | Status: DC

## 2018-11-15 NOTE — Progress Notes (Signed)
Last OV:  07/22/2018 Next OV:  Not Yet Scheduled Last Fill:  09/09/2018  Please advise.

## 2018-12-09 ENCOUNTER — Encounter: Payer: Self-pay | Admitting: Family Medicine

## 2018-12-09 MED ORDER — CLONAZEPAM 0.5 MG PO TABS: 0.5000 mg | ORAL_TABLET | Freq: Every evening | ORAL | 3 refills | Status: DC | PRN

## 2018-12-26 ENCOUNTER — Other Ambulatory Visit: Payer: Self-pay

## 2018-12-26 MED ORDER — MONTELUKAST SODIUM 10 MG PO TABS: 10.0000 mg | ORAL_TABLET | ORAL | 2 refills | Status: DC

## 2019-01-11 ENCOUNTER — Other Ambulatory Visit: Payer: Self-pay | Admitting: Family Medicine

## 2019-01-13 NOTE — Telephone Encounter (Signed)
Rx request 

## 2019-01-31 ENCOUNTER — Other Ambulatory Visit (HOSPITAL_COMMUNITY): Payer: Self-pay | Admitting: Nurse Practitioner

## 2019-02-03 ENCOUNTER — Other Ambulatory Visit: Payer: Self-pay

## 2019-02-03 MED ORDER — BUSPIRONE HCL 7.5 MG PO TABS: 7.5000 mg | ORAL_TABLET | Freq: Two times a day (BID) | ORAL | 1 refills | Status: DC

## 2019-02-19 ENCOUNTER — Telehealth (HOSPITAL_COMMUNITY): Payer: Self-pay | Admitting: *Deleted

## 2019-02-19 NOTE — Telephone Encounter (Signed)
Patient called in stating his AF has been quiet and he feels his AF episodes were related to prednisone usage. He would like to stop Multaq. Discussed with Roderic Palau NP - pt can stop if AF should return let our office know. Pt understands risk of AF return. Will call if further issues.

## 2019-02-23 NOTE — Progress Notes (Signed)
[facility] [place],  [address] Phone: [phone] Subjective:   John [name], am serving as a scribe for Dr. Hulan Saas.  I'm seeing this patient by the request  of:    CC: Finger pain follow-up  [id number]   08/26/2018 Injected today.  Patient does know do a type of employment that increases her risk.  We discussed bracing at night, discussed over-the-counter medicines, we discussed other things.  Follow-up with again patient wants to avoid any surgical intervention  Update 02/24/2019 John [name] is a 63 y.o. male coming in with complaint of left hand trigger finger. Patient states that one month ago finger began trigger again especially at night.      Past Medical History:  Diagnosis Date  . Afib ([place])   . Anxiety and depression   . Asthma   . Emphysema of lung ([place])   . GERD (gastroesophageal reflux disease)   . Hernia    right inguinal  . History of bronchitis   . Hyperlipidemia   . Hypertension   . Morton's neuroma   . OA (osteoarthritis)    neck  . Obstructive sleep apnea on CPAP   . Pancreatitis   . Torn meniscus    Left   Past Surgical History:  Procedure Laterality Date  . BONE CYST EXCISION  1991   left wrist  . COLONOSCOPY    . HERNIA REPAIR  04/14/11   right inguinal  . HERNIA REPAIR     left inguinal, december 2020   Social History   Socioeconomic History  . Marital status: Divorced    Spouse name: Not on file  . Number of children: Not on file  . Years of education: Not on file  . Highest education level: Not on file  Occupational History  . Not on file  Social Needs  . Financial resource strain: Not on file  . Food insecurity    Worry: Not on file    Inability: Not on file  . Transportation needs    Medical: Not on file    Non-medical: Not on file  Tobacco Use  . Smoking status: Former Smoker    Packs/day: 1.50  . Smokeless tobacco: Never Used  Substance and Sexual  Activity  . Alcohol use: Not Currently  . Drug use: [name]  . Sexual activity: Yes    Partners: Female  Lifestyle  . Physical activity    Days per week: Not on file    Minutes per session: Not on file  . Stress: Not on file  Relationships  . Social Herbalist on phone: Not on file    Gets together: Not on file    Attends religious service: Not on file    Active member of club or organization: Not on file    Attends meetings of clubs or organizations: Not on file    Relationship status: Not on file  Other Topics Concern  . Not on file  Social History Narrative  . Not on file   [name] Known Allergies Family History  Problem Relation Age of Onset  . Hypertension Mother   . Dementia Other   . Hypertension Sister   . Hypertension Brother      Current Outpatient Medications (Cardiovascular):  .  diltiazem (CARDIZEM) 30 MG tablet, Take one tablet by mouth every 4 hours AS NEEDED for A-fib HR > 100 as long as BP > 100. **NEEDS APPT .  dronedarone (MULTAQ) 400 MG tablet, Take 1 tablet (400 mg total) by mouth 2 (two) times daily with a meal. Please Schedule Appointment for further refills [phone] .  olmesartan (BENICAR) 40 MG tablet, Take 1 tablet (40 mg total) by mouth daily. .  sildenafil (VIAGRA) 100 MG tablet, Take 1 tablet (100 mg total) by mouth daily as needed for erectile dysfunction.  Current Outpatient Medications (Respiratory):  .  ipratropium (ATROVENT) 0.06 % nasal spray, USE 2 SPRAYS IN EACH NOSTRIL UP TO THREE TIMES DAILY .  montelukast (SINGULAIR) 10 MG tablet, Take 1 tablet (10 mg total) by mouth every morning. .  Triamcinolone Acetonide (NASACORT ALLERGY 24HR NA), Place into the nose.  Current Outpatient Medications (Analgesics):  .  meloxicam (MOBIC) 15 MG tablet, TAKE 1 TABLET BY MOUTH EVERY DAY .  traMADol (ULTRAM) 50 MG tablet, TAKE 1 TABLET BY MOUTH TWICE A DAY  Current Outpatient Medications (Hematological):  .  vitamin B-12 (CYANOCOBALAMIN) 100  MCG tablet, Take 50 mcg by mouth daily.    Current Outpatient Medications (Other):  .  busPIRone (BUSPAR) 7.5 MG tablet, Take 1 tablet (7.5 mg total) by mouth 2 (two) times daily. .  cholecalciferol (VITAMIN D3) 25 MCG (1000 UT) tablet, Take 1,000 Units by mouth 2 (two) times daily. .  clonazePAM (KLONOPIN) 0.5 MG tablet, Take 1 tablet (0.5 mg total) by mouth at bedtime as needed for anxiety. .  Coenzyme Q10 (CO Q 10) 100 MG CAPS, Take by mouth. .  esomeprazole (NEXIUM) 20 MG capsule, Take 20 mg by mouth daily at 12 noon. .  gabapentin (NEURONTIN) 100 MG capsule, TAKE 2 CAPSULES BY MOUTH AT BEDTIME .  hydrOXYzine (ATARAX/VISTARIL) 25 MG tablet, Take 1 - 2  Tablets  3 times daily as needed for anxiety. .  methocarbamol (ROBAXIN) 500 MG tablet, Take 500 mg by mouth as needed for muscle spasms. .  Multiple Vitamins-Minerals (MULTIVITAMIN WITH MINERALS) tablet, Take 1 tablet by mouth daily.   .  Omega-3 Fatty Acids (FISH OIL) 1200 MG CAPS, Take by mouth. .  Plant Sterol Stanol-Pantethine 300-100 MG CAPS, Take by mouth. Marland Kitchen  Specialty Vitamins Products (MAGNESIUM, AMINO ACID CHELATE,) 133 MG tablet, Take 1 tablet by mouth 2 (two) times a week.  .  Turmeric 450 MG CAPS, Take by mouth. .  vitamin E 400 UNIT capsule, Take 400 Units by mouth daily.    Past medical history, social, surgical and family history all reviewed in electronic medical record.  [name] pertanent information unless stated regarding to the chief complaint.   Review of Systems:  [name] headache, visual changes, nausea, vomiting, diarrhea, constipation, dizziness, abdominal pain, skin rash, fevers, chills, night sweats, weight loss, swollen lymph nodes, body aches, joint swelling, muscle aches, chest pain, shortness of breath, mood changes.   Objective  There were [name] vitals taken for this visit. Systems examined below as of    General: [name] apparent distress alert and oriented x3 mood and affect normal, dressed appropriately.  HEENT:  Pupils equal, extraocular movements intact  Respiratory: Patient's speak in full sentences and does not appear short of breath  Cardiovascular: [name] lower extremity edema, non tender, [name] erythema  Skin: Warm dry intact with [name] signs of infection or rash on extremities or on axial skeleton.  Abdomen: Soft nontender  Neuro: Cranial nerves II through XII are intact, neurovascularly intact in all extremities with 2+ DTRs and 2+ pulses.  Lymph: [name] lymphadenopathy of posterior or anterior cervical chain or axillae bilaterally.  Gait normal  with good balance and coordination.  MSK: Left hand exam shows the patient does have a trigger nodule at the A2 pulley.  Tender to palpation there.  Patient does have mild swelling noted.  Procedure: Real-time Ultrasound Guided Injection of left flexor tendon sheath under Device: GE Logiq Q7 Ultrasound guided injection is preferred based studies that show increased duration, increased effect, greater accuracy, decreased procedural pain, increased response rate, and decreased cost with ultrasound guided versus blind injection.  Verbal informed consent obtained.  Time-out conducted.  Noted [name] overlying erythema, induration, or other signs of local infection.  Skin prepped in a sterile fashion.  Local anesthesia: Topical Ethyl chloride.  With sterile technique and under real time ultrasound guidance: With a 25-gauge half inch needle injected with 0.5 cc of 0.5% Marcaine and 0.5 cc of Kenalog 40 mg/mL Completed without difficulty  Pain immediately resolved suggesting accurate placement of the medication.  Advised to call if fevers/chills, erythema, induration, drainage, or persistent bleeding.  Images permanently stored and available for review in the ultrasound unit.  Impression: Technically successful ultrasound guided injection.    Impression and Recommendations:     This case required medical decision making of moderate complexity. The above documentation has  been reviewed and is accurate and complete Lyndal Pulley, DO       Note: This dictation was prepared with Dragon dictation along with smaller phrase technology. Any transcriptional errors that result from this process are unintentional.

## 2019-02-24 ENCOUNTER — Ambulatory Visit: Payer: Self-pay

## 2019-02-24 ENCOUNTER — Encounter: Payer: Self-pay | Admitting: Family Medicine

## 2019-02-24 ENCOUNTER — Ambulatory Visit: Payer: BLUE CROSS/BLUE SHIELD | Admitting: Family Medicine

## 2019-02-24 ENCOUNTER — Other Ambulatory Visit: Payer: Self-pay

## 2019-02-24 VITALS — BP 132/92 | HR 96 | Ht 72.0 in | Wt 201.0 lb

## 2019-02-24 DIAGNOSIS — M79642 Pain in left hand: Secondary | ICD-10-CM | POA: Diagnosis not present

## 2019-02-24 DIAGNOSIS — M65342 Trigger finger, left ring finger: Secondary | ICD-10-CM

## 2019-02-24 MED ORDER — VITAMIN D (ERGOCALCIFEROL) 1.25 MG (50000 UNIT) PO CAPS: 50000.0000 [IU] | ORAL_CAPSULE | ORAL | 0 refills | Status: DC

## 2019-02-24 NOTE — Patient Instructions (Signed)
Once weekly vitamin d See me in the new year

## 2019-02-24 NOTE — Assessment & Plan Note (Signed)
Repeat injection given today.  Discussed bracing at night.  Patient did bring up on possibility of some neck pain.  We discussed we can further evaluate at a later date.

## 2019-02-25 ENCOUNTER — Encounter: Payer: Self-pay | Admitting: Family Medicine

## 2019-03-14 ENCOUNTER — Other Ambulatory Visit: Payer: Self-pay | Admitting: Family Medicine

## 2019-03-17 ENCOUNTER — Telehealth: Payer: Self-pay | Admitting: Family Medicine

## 2019-03-17 NOTE — Telephone Encounter (Signed)
Pt is calling and needs a refill asmanex inhaler. [facility]

## 2019-03-17 NOTE — Telephone Encounter (Signed)
See below

## 2019-03-18 ENCOUNTER — Other Ambulatory Visit: Payer: Self-pay

## 2019-03-18 MED ORDER — ASMANEX (30 METERED DOSES) 220 MCG/INH IN AEPB: 1.0000 | INHALATION_SPRAY | Freq: Every day | RESPIRATORY_TRACT | 3 refills | Status: DC

## 2019-03-18 NOTE — Telephone Encounter (Signed)
This is not on his medication list,patient needs a appt.

## 2019-03-18 NOTE — Telephone Encounter (Signed)
Rx sent 

## 2019-04-08 ENCOUNTER — Other Ambulatory Visit: Payer: Self-pay | Admitting: Family Medicine

## 2019-04-14 ENCOUNTER — Other Ambulatory Visit: Payer: Self-pay | Admitting: Emergency Medicine

## 2019-04-14 ENCOUNTER — Encounter: Payer: Self-pay | Admitting: Family Medicine

## 2019-04-14 DIAGNOSIS — G8929 Other chronic pain: Secondary | ICD-10-CM

## 2019-04-14 DIAGNOSIS — M5440 Lumbago with sciatica, unspecified side: Secondary | ICD-10-CM

## 2019-04-14 MED ORDER — MELOXICAM 15 MG PO TABS: 15.0000 mg | ORAL_TABLET | Freq: Every day | ORAL | 1 refills | Status: DC

## 2019-04-15 ENCOUNTER — Other Ambulatory Visit: Payer: Self-pay

## 2019-04-15 DIAGNOSIS — M5440 Lumbago with sciatica, unspecified side: Secondary | ICD-10-CM

## 2019-04-15 DIAGNOSIS — G8929 Other chronic pain: Secondary | ICD-10-CM

## 2019-04-15 MED ORDER — MELOXICAM 15 MG PO TABS: 15.0000 mg | ORAL_TABLET | Freq: Every day | ORAL | 0 refills | Status: DC

## 2019-04-19 ENCOUNTER — Other Ambulatory Visit: Payer: Self-pay | Admitting: Family Medicine

## 2019-05-01 ENCOUNTER — Other Ambulatory Visit: Payer: Self-pay

## 2019-05-02 ENCOUNTER — Ambulatory Visit (INDEPENDENT_AMBULATORY_CARE_PROVIDER_SITE_OTHER): Payer: 59 | Admitting: Family Medicine

## 2019-05-02 ENCOUNTER — Encounter: Payer: Self-pay | Admitting: Family Medicine

## 2019-05-02 VITALS — BP 139/91 | HR 103 | Temp 97.7°F | Ht 72.0 in | Wt 202.2 lb

## 2019-05-02 DIAGNOSIS — F419 Anxiety disorder, unspecified: Secondary | ICD-10-CM

## 2019-05-02 DIAGNOSIS — Z1211 Encounter for screening for malignant neoplasm of colon: Secondary | ICD-10-CM

## 2019-05-02 DIAGNOSIS — E663 Overweight: Secondary | ICD-10-CM

## 2019-05-02 DIAGNOSIS — I10 Essential (primary) hypertension: Secondary | ICD-10-CM

## 2019-05-02 DIAGNOSIS — Z0001 Encounter for general adult medical examination with abnormal findings: Secondary | ICD-10-CM | POA: Diagnosis not present

## 2019-05-02 DIAGNOSIS — R5383 Other fatigue: Secondary | ICD-10-CM

## 2019-05-02 DIAGNOSIS — N529 Male erectile dysfunction, unspecified: Secondary | ICD-10-CM

## 2019-05-02 DIAGNOSIS — R739 Hyperglycemia, unspecified: Secondary | ICD-10-CM | POA: Diagnosis not present

## 2019-05-02 DIAGNOSIS — E785 Hyperlipidemia, unspecified: Secondary | ICD-10-CM | POA: Diagnosis not present

## 2019-05-02 DIAGNOSIS — Z125 Encounter for screening for malignant neoplasm of prostate: Secondary | ICD-10-CM

## 2019-05-02 DIAGNOSIS — I480 Paroxysmal atrial fibrillation: Secondary | ICD-10-CM

## 2019-05-02 DIAGNOSIS — K219 Gastro-esophageal reflux disease without esophagitis: Secondary | ICD-10-CM

## 2019-05-02 DIAGNOSIS — J449 Chronic obstructive pulmonary disease, unspecified: Secondary | ICD-10-CM

## 2019-05-02 DIAGNOSIS — Z6827 Body mass index (BMI) 27.0-27.9, adult: Secondary | ICD-10-CM

## 2019-05-02 MED ORDER — SPIRIVA RESPIMAT 1.25 MCG/ACT IN AERS: 2.0000 | INHALATION_SPRAY | Freq: Every day | RESPIRATORY_TRACT | 11 refills | Status: DC

## 2019-05-02 MED ORDER — SPIRIVA RESPIMAT 1.25 MCG/ACT IN AERS: 2.0000 | INHALATION_SPRAY | Freq: Two times a day (BID) | RESPIRATORY_TRACT | 11 refills | Status: DC

## 2019-05-02 NOTE — Assessment & Plan Note (Signed)
Continue Nexium 20 mg daily.

## 2019-05-02 NOTE — Assessment & Plan Note (Signed)
Check A1c. 

## 2019-05-02 NOTE — Assessment & Plan Note (Signed)
Check lipid panel.  He has refused statins in the past.

## 2019-05-02 NOTE — Assessment & Plan Note (Signed)
Continue Klonopin 0.5 mg at bedtime as needed.

## 2019-05-02 NOTE — Assessment & Plan Note (Signed)
At goal.  Continue olmesartan 40 mg daily.

## 2019-05-02 NOTE — Assessment & Plan Note (Signed)
Continue Viagra 100 mg daily as needed

## 2019-05-02 NOTE — Progress Notes (Signed)
Chief Complaint:  John [name] is a 64 y.o. male who presents today for his annual comprehensive physical exam.    Assessment/Plan:  New/Acute Problems: Fatigue Check CBC, c-Met, TSH, lipid panel, B12, testosterone level.  Chronic Problems Addressed Today: Hyperglycemia Check A1c.  Dyslipidemia Check lipid panel.  He has refused statins in the past.  GERD (gastroesophageal reflux disease) Continue Nexium 20 mg daily.  Erectile dysfunction Continue Viagra 100 mg daily as needed.  Anxiety Continue Klonopin 0.5 mg at bedtime as needed.  COPD (chronic obstructive pulmonary disease) ([place]) He would like to switch away from inhaled corticosteroid due to concern for long-term complications.  We will send in Spiriva instead.  Essential hypertension At goal.  Continue olmesartan 40 mg daily.  PAF (paroxysmal atrial fibrillation) ([place]) Continue management per cardiology.   Body mass index is 27.43 kg/m. / Obese BMI Metric Follow Up - 05/02/19 1429      BMI Metric Follow Up-Please document annually   BMI Metric Follow Up  Education provided        Preventative Healthcare: Check CBC, c-Met, TSH, lipid panel, B12.  Will place order for Cologuard.  Patient Counseling(The following topics were reviewed and/or handout was given):  -Nutrition: Stressed importance of moderation in sodium/caffeine intake, saturated fat and cholesterol, caloric balance, sufficient intake of fresh fruits, vegetables, and fiber.  -Stressed the importance of regular exercise.   -Substance Abuse: Discussed cessation/primary prevention of tobacco, alcohol, or other drug use; driving or other dangerous activities under the influence; availability of treatment for abuse.   -Injury prevention: Discussed safety belts, safety helmets, smoke detector, smoking near bedding or upholstery.   -Sexuality: Discussed sexually transmitted diseases, partner selection, use of condoms, avoidance of unintended pregnancy  and contraceptive alternatives.   -Dental health: Discussed importance of regular tooth brushing, flossing, and dental visits.  -Health maintenance and immunizations reviewed. Please refer to Health maintenance section.  Return to care in 1 year for next preventative visit.     Subjective:  HPI:  He has no acute complaints today.   Lifestyle Diet: Balanced.  Exercise: Busy with work.   Depression screen PHQ 2/9 04/22/2018  Decreased Interest 0  Down, Depressed, Hopeless 0  PHQ - 2 Score 0    Health Maintenance Due  Topic Date Due  . COLONOSCOPY  10/31/2018     ROS: Per HPI, otherwise a complete review of systems was negative.   PMH:  The following were reviewed and entered/updated in epic: Past Medical History:  Diagnosis Date  . Afib ([place])   . Anxiety and depression   . Asthma   . Emphysema of lung ([place])   . GERD (gastroesophageal reflux disease)   . Hernia    right inguinal  . History of bronchitis   . Hyperlipidemia   . Hypertension   . Morton's neuroma   . OA (osteoarthritis)    neck  . Obstructive sleep apnea on CPAP   . Pancreatitis   . Torn meniscus    Left   Patient Active Problem List   Diagnosis Date Noted  . Left trigger finger 08/26/2018  . Dyslipidemia 04/23/2018  . Hyperglycemia 04/23/2018  . Essential hypertension 03/04/2018  . Chronic low back pain with sciatica 03/04/2018  . COPD (chronic obstructive pulmonary disease) ([place]) 03/04/2018  . Allergic rhinitis 03/04/2018  . Anxiety 03/04/2018  . Erectile dysfunction 03/04/2018  . GERD (gastroesophageal reflux disease) 03/04/2018  . PAF (paroxysmal atrial fibrillation) ([place]) 03/28/2017   Past Surgical History:  Procedure Laterality Date  . BONE CYST EXCISION  1991   left wrist  . COLONOSCOPY    . HERNIA REPAIR  04/14/11   right inguinal  . HERNIA REPAIR     left inguinal, december 2020    Family History  Problem Relation Age of Onset  . Hypertension Mother   . Dementia Other     . Hypertension Sister   . Hypertension Brother     Medications- reviewed and updated Current Outpatient Medications  Medication Sig Dispense Refill  . ASMANEX, 30 METERED DOSES, 220 MCG/INH inhaler Inhale 1 puff into the lungs daily. 1 Inhaler 3  . cholecalciferol (VITAMIN D3) 25 MCG (1000 UT) tablet Take 1,000 Units by mouth 2 (two) times daily.    . clonazePAM (KLONOPIN) 0.5 MG tablet Take 1 tablet (0.5 mg total) by mouth at bedtime as needed for anxiety. 30 tablet 3  . Coenzyme Q10 (CO Q 10) 100 MG CAPS Take by mouth.    . diltiazem (CARDIZEM) 30 MG tablet Take one tablet by mouth every 4 hours AS NEEDED for A-fib HR > 100 as long as BP > 100. **NEEDS APPT 30 tablet 0  . esomeprazole (NEXIUM) 20 MG capsule Take 20 mg by mouth daily at 12 noon.    . gabapentin (NEURONTIN) 100 MG capsule TAKE 2 CAPSULES BY MOUTH AT BEDTIME 180 capsule 2  . hydrOXYzine (ATARAX/VISTARIL) 25 MG tablet Take 1 - 2  Tablets  3 times daily as needed for anxiety. 90 tablet 1  . ipratropium (ATROVENT) 0.06 % nasal spray USE 2 SPRAYS IN EACH NOSTRIL UP TO THREE TIMES DAILY 15 mL 0  . meloxicam (MOBIC) 15 MG tablet Take 1 tablet (15 mg total) by mouth daily. 30 tablet 0  . methocarbamol (ROBAXIN) 500 MG tablet Take 500 mg by mouth as needed for muscle spasms.    . montelukast (SINGULAIR) 10 MG tablet TAKE 1 TABLET BY MOUTH EVERY DAY IN THE MORNING 90 tablet 0  . Multiple Vitamins-Minerals (MULTIVITAMIN WITH MINERALS) tablet Take 1 tablet by mouth daily.      Marland Kitchen olmesartan (BENICAR) 40 MG tablet Take 1 tablet (40 mg total) by mouth daily. 90 tablet 3  . Omega-3 Fatty Acids (FISH OIL) 1200 MG CAPS Take by mouth.    . sildenafil (VIAGRA) 100 MG tablet Take 1 tablet (100 mg total) by mouth daily as needed for erectile dysfunction. 20 tablet 2  . Specialty Vitamins Products (MAGNESIUM, AMINO ACID CHELATE,) 133 MG tablet Take 1 tablet by mouth 2 (two) times a week.     . traMADol (ULTRAM) 50 MG tablet TAKE 1 TABLET BY MOUTH  TWICE A DAY 60 tablet 2  . Triamcinolone Acetonide (NASACORT ALLERGY 24HR NA) Place into the nose.    . Turmeric 450 MG CAPS Take by mouth.    . vitamin B-12 (CYANOCOBALAMIN) 100 MCG tablet Take 50 mcg by mouth daily.      . vitamin E 400 UNIT capsule Take 400 Units by mouth daily.    . Plant Sterol Stanol-Pantethine 300-100 MG CAPS Take by mouth.    . Tiotropium Bromide Monohydrate (SPIRIVA RESPIMAT) 1.25 MCG/ACT AERS Inhale 2 puffs into the lungs daily. 4 g 11   No current facility-administered medications for this visit.    Allergies-reviewed and updated No Known Allergies  Social History   Socioeconomic History  . Marital status: Divorced    Spouse name: Not on file  . Number of children: Not on file  . Years  of education: Not on file  . Highest education level: Not on file  Occupational History  . Not on file  Tobacco Use  . Smoking status: Former Smoker    Packs/day: 1.50  . Smokeless tobacco: Never Used  Substance and Sexual Activity  . Alcohol use: Not Currently  . Drug use: No  . Sexual activity: Yes    Partners: Female  Other Topics Concern  . Not on file  Social History Narrative  . Not on file   Social Determinants of Health   Financial Resource Strain:   . Difficulty of Paying Living Expenses: Not on file  Food Insecurity:   . Worried About Charity fundraiser in the Last Year: Not on file  . Ran Out of Food in the Last Year: Not on file  Transportation Needs:   . Lack of Transportation (Medical): Not on file  . Lack of Transportation (Non-Medical): Not on file  Physical Activity:   . Days of Exercise per Week: Not on file  . Minutes of Exercise per Session: Not on file  Stress:   . Feeling of Stress : Not on file  Social Connections:   . Frequency of Communication with Friends and Family: Not on file  . Frequency of Social Gatherings with Friends and Family: Not on file  . Attends Religious Services: Not on file  . Active Member of Clubs or  Organizations: Not on file  . Attends Archivist Meetings: Not on file  . Marital Status: Not on file        Objective:  Physical Exam: BP (!) 139/91   Pulse (!) 103   Temp 97.7 F (36.5 C)   Ht 6' (1.829 m)   Wt 202 lb 4 oz (91.7 kg)   SpO2 96%   BMI 27.43 kg/m   Body mass index is 27.43 kg/m. Wt Readings from Last 3 Encounters:  05/02/19 202 lb 4 oz (91.7 kg)  02/24/19 201 lb (91.2 kg)  08/26/18 199 lb (90.3 kg)  Gen: NAD, resting comfortably HEENT: TMs normal bilaterally. OP clear. No thyromegaly noted.  CV: RRR with no murmurs appreciated Pulm: NWOB, CTAB with no crackles, wheezes, or rhonchi GI: Normal bowel sounds present. Soft, Nontender, Nondistended. MSK: no edema, cyanosis, or clubbing noted Skin: warm, dry Neuro: CN2-12 grossly intact. Strength 5/5 in upper and lower extremities. Reflexes symmetric and intact bilaterally.  Psych: Normal affect and thought content     Elgie Landino M. [name], MD 05/02/2019 2:30 PM

## 2019-05-02 NOTE — Assessment & Plan Note (Signed)
He would like to switch away from inhaled corticosteroid due to concern for long-term complications.  We will send in Spiriva instead.

## 2019-05-02 NOTE — Assessment & Plan Note (Signed)
Continue management per cardiology. 

## 2019-05-02 NOTE — Patient Instructions (Signed)
It was very nice to see you today!  Please try the Spiriva.  We will set you up to have cologuard.   We will check blood work today.  No other change today.  Come back in 1 year for your next checkup, or sooner if needed.  Take care, Dr [name]  Please try these tips to maintain a healthy lifestyle:   Eat at least 3 REAL meals and 1-2 snacks per day.  Aim for no more than 5 hours between eating.  If you eat breakfast, please do so within one hour of getting up.    Each meal should contain half fruits/vegetables, one quarter protein, and one quarter carbs (no bigger than a computer mouse)   Cut down on sweet beverages. This includes juice, soda, and sweet tea.     Drink at least 1 glass of water with each meal and aim for at least 8 glasses per day   Exercise at least 150 minutes every week.  Preventive Care 24-81 Years Old, Male Preventive care refers to lifestyle choices and visits with your health care provider that can promote health and wellness. This includes:  A yearly physical exam. This is also called an annual well check.  Regular dental and eye exams.  Immunizations.  Screening for certain conditions.  Healthy lifestyle choices, such as eating a healthy diet, getting regular exercise, not using drugs or products that contain nicotine and tobacco, and limiting alcohol use. What can I expect for my preventive care visit? Physical exam Your health care provider will check:  Height and weight. These may be used to calculate body mass index (BMI), which is a measurement that tells if you are at a healthy weight.  Heart rate and blood pressure.  Your skin for abnormal spots. Counseling Your health care provider may ask you questions about:  Alcohol, tobacco, and drug use.  Emotional well-being.  Home and relationship well-being.  Sexual activity.  Eating habits.  Work and work Statistician. What immunizations do I need?  Influenza (flu)  vaccine  This is recommended every year. Tetanus, diphtheria, and pertussis (Tdap) vaccine  You may need a Td booster every 10 years. Varicella (chickenpox) vaccine  You may need this vaccine if you have not already been vaccinated. Zoster (shingles) vaccine  You may need this after age 38. Measles, mumps, and rubella (MMR) vaccine  You may need at least one dose of MMR if you were born in 1957 or later. You may also need a second dose. Pneumococcal conjugate (PCV13) vaccine  You may need this if you have certain conditions and were not previously vaccinated. Pneumococcal polysaccharide (PPSV23) vaccine  You may need one or two doses if you smoke cigarettes or if you have certain conditions. Meningococcal conjugate (MenACWY) vaccine  You may need this if you have certain conditions. Hepatitis A vaccine  You may need this if you have certain conditions or if you travel or work in places where you may be exposed to hepatitis A. Hepatitis B vaccine  You may need this if you have certain conditions or if you travel or work in places where you may be exposed to hepatitis B. Haemophilus influenzae type b (Hib) vaccine  You may need this if you have certain risk factors. Human papillomavirus (HPV) vaccine  If recommended by your health care provider, you may need three doses over 6 months. You may receive vaccines as individual doses or as more than one vaccine together in one shot (combination vaccines).  Talk with your health care provider about the risks and benefits of combination vaccines. What tests do I need? Blood tests  Lipid and cholesterol levels. These may be checked every 5 years, or more frequently if you are over 42 years old.  Hepatitis C test.  Hepatitis B test. Screening  Lung cancer screening. You may have this screening every year starting at age 61 if you have a 30-pack-year history of smoking and currently smoke or have quit within the past 15  years.  Prostate cancer screening. Recommendations will vary depending on your family history and other risks.  Colorectal cancer screening. All adults should have this screening starting at age 64 and continuing until age 32. Your health care provider may recommend screening at age 105 if you are at increased risk. You will have tests every 1-10 years, depending on your results and the type of screening test.  Diabetes screening. This is done by checking your blood sugar (glucose) after you have not eaten for a while (fasting). You may have this done every 1-3 years.  Sexually transmitted disease (STD) testing. Follow these instructions at home: Eating and drinking  Eat a diet that includes fresh fruits and vegetables, whole grains, lean protein, and low-fat dairy products.  Take vitamin and mineral supplements as recommended by your health care provider.  Do not drink alcohol if your health care provider tells you not to drink.  If you drink alcohol: ? Limit how much you have to 0-2 drinks a day. ? Be aware of how much alcohol is in your drink. In the [place] one drink equals one 12 oz bottle of beer (355 mL), one 5 oz glass of wine (148 mL), or one 1 oz glass of hard liquor (44 mL). Lifestyle  Take daily care of your teeth and gums.  Stay active. Exercise for at least 30 minutes on 5 or more days each week.  Do not use any products that contain nicotine or tobacco, such as cigarettes, e-cigarettes, and chewing tobacco. If you need help quitting, ask your health care provider.  If you are sexually active, practice safe sex. Use a condom or other form of protection to prevent STIs (sexually transmitted infections).  Talk with your health care provider about taking a low-dose aspirin every day starting at age 21. What's next?  Go to your health care provider once a year for a well check visit.  Ask your health care provider how often you should have your eyes and teeth  checked.  Stay up to date on all vaccines. This information is not intended to replace advice given to you by your health care provider. Make sure you discuss any questions you have with your health care provider. Document Revised: 03/21/2018 Document Reviewed: 03/21/2018 Elsevier Patient Education  2020 [employer].

## 2019-05-03 ENCOUNTER — Other Ambulatory Visit (HOSPITAL_COMMUNITY): Payer: Self-pay | Admitting: Nurse Practitioner

## 2019-05-03 LAB — COMPREHENSIVE METABOLIC PANEL
AG Ratio: 1.4 (calc) (ref 1.0–2.5)
ALT: 31 U/L (ref 9–46)
AST: 24 U/L (ref 10–35)
Albumin: 4.2 g/dL (ref 3.6–5.1)
Alkaline phosphatase (APISO): 78 U/L (ref 35–144)
BUN: 24 mg/dL (ref 7–25)
CO2: 25 mmol/L (ref 20–32)
Calcium: 9.8 mg/dL (ref 8.6–10.3)
Chloride: 104 mmol/L (ref 98–110)
Creat: 1.02 mg/dL (ref 0.70–1.25)
Globulin: 3.1 g/dL (calc) (ref 1.9–3.7)
Glucose, Bld: 94 mg/dL (ref 65–99)
Potassium: 4.6 mmol/L (ref 3.5–5.3)
Sodium: 138 mmol/L (ref 135–146)
Total Bilirubin: 0.5 mg/dL (ref 0.2–1.2)
Total Protein: 7.3 g/dL (ref 6.1–8.1)

## 2019-05-03 LAB — TSH: TSH: 1.42 mIU/L (ref 0.40–4.50)

## 2019-05-03 LAB — CBC
HCT: 43.9 % (ref 38.5–50.0)
Hemoglobin: 14.9 g/dL (ref 13.2–17.1)
MCH: 29.9 pg (ref 27.0–33.0)
MCHC: 33.9 g/dL (ref 32.0–36.0)
MCV: 88.2 fL (ref 80.0–100.0)
MPV: 10.6 fL (ref 7.5–12.5)
Platelets: 179 10*3/uL (ref 140–400)
RBC: 4.98 10*6/uL (ref 4.20–5.80)
RDW: 12.4 % (ref 11.0–15.0)
WBC: 6.6 10*3/uL (ref 3.8–10.8)

## 2019-05-03 LAB — LIPID PANEL
Cholesterol: 232 mg/dL — ABNORMAL HIGH (ref <200)
HDL: 38 mg/dL — ABNORMAL LOW (ref >=40)
LDL Cholesterol (Calc): 165 mg/dL (calc) — ABNORMAL HIGH
Non-HDL Cholesterol (Calc): 194 mg/dL (calc) — ABNORMAL HIGH (ref <130)
Total CHOL/HDL Ratio: 6.1 (calc) — ABNORMAL HIGH (ref <5.0)
Triglycerides: 145 mg/dL (ref <150)

## 2019-05-03 LAB — HEMOGLOBIN A1C
Hgb A1c MFr Bld: 5.7 % of total Hgb — ABNORMAL HIGH (ref <5.7)
Mean Plasma Glucose: 117 (calc)
eAG (mmol/L): 6.5 (calc)

## 2019-05-03 LAB — VITAMIN B12: Vitamin B-12: 1043 pg/mL (ref 200–1100)

## 2019-05-03 LAB — TESTOSTERONE: Testosterone: 238 ng/dL — ABNORMAL LOW (ref 250–827)

## 2019-05-03 LAB — PSA: PSA: 0.6 ng/mL (ref <=4.0)

## 2019-05-05 ENCOUNTER — Other Ambulatory Visit: Payer: Self-pay

## 2019-05-05 ENCOUNTER — Telehealth (HOSPITAL_COMMUNITY): Payer: Self-pay

## 2019-05-05 DIAGNOSIS — R7989 Other specified abnormal findings of blood chemistry: Secondary | ICD-10-CM

## 2019-05-05 NOTE — Progress Notes (Signed)
Please inform patient of the following:  Testosterone level is slightly low. Recommend that he come back to recheck before we talk about treatment - insurance requires 2 low levels before they will pay for treatment.  Blood sugar and cholesterol levels are borderline. Would like for him to continue to work on diet and exercise and we can recheck in a year or so.   All of his other labs are normal.

## 2019-05-05 NOTE — Telephone Encounter (Signed)
Received an e-refill request for Multaq. PCP discontinued on 05/01/2019. Reached out to the pt to see if he was still taking medication. Please advise if pt call back

## 2019-05-07 ENCOUNTER — Encounter: Payer: Self-pay | Admitting: Family Medicine

## 2019-05-08 ENCOUNTER — Other Ambulatory Visit: Payer: Self-pay

## 2019-05-08 MED ORDER — SILDENAFIL CITRATE 100 MG PO TABS: 100.0000 mg | ORAL_TABLET | Freq: Every day | ORAL | 4 refills | Status: DC | PRN

## 2019-05-09 ENCOUNTER — Other Ambulatory Visit: Payer: Self-pay

## 2019-05-12 ENCOUNTER — Other Ambulatory Visit: Payer: Self-pay | Admitting: Family Medicine

## 2019-05-12 ENCOUNTER — Other Ambulatory Visit (INDEPENDENT_AMBULATORY_CARE_PROVIDER_SITE_OTHER): Payer: 59

## 2019-05-12 ENCOUNTER — Other Ambulatory Visit: Payer: Self-pay

## 2019-05-12 DIAGNOSIS — R7989 Other specified abnormal findings of blood chemistry: Secondary | ICD-10-CM

## 2019-05-12 LAB — TESTOSTERONE: Testosterone: 312.56 ng/dL (ref 300.00–890.00)

## 2019-05-12 MED ORDER — IPRATROPIUM BROMIDE 0.06 % NA SOLN: NASAL | 0 refills | Status: DC

## 2019-05-12 NOTE — Progress Notes (Signed)
Please inform patient of the following:  Repeat testosterone was back in the NORMAL range. Unfortunately insurance will not pay for replacement until we have 2 documented lows. HE can schedule an OV to discuss next steps if he wishes.  John [name]. [name], MD 05/12/2019 3:04 PM

## 2019-05-13 LAB — COLOGUARD: Cologuard: NEGATIVE

## 2019-05-16 LAB — COLOGUARD: COLOGUARD: NEGATIVE

## 2019-05-20 ENCOUNTER — Encounter: Payer: Self-pay | Admitting: Family Medicine

## 2019-05-20 ENCOUNTER — Telehealth: Payer: Self-pay

## 2019-05-20 NOTE — Telephone Encounter (Signed)
Cologuard Results Negative,Left detailed message on patient voicemail

## 2019-06-09 ENCOUNTER — Other Ambulatory Visit: Payer: Self-pay

## 2019-06-09 MED ORDER — MONTELUKAST SODIUM 10 MG PO TABS: 10.0000 mg | ORAL_TABLET | Freq: Every day | ORAL | 1 refills | Status: DC

## 2019-06-09 MED ORDER — OLMESARTAN MEDOXOMIL 40 MG PO TABS: 40.0000 mg | ORAL_TABLET | Freq: Every day | ORAL | 3 refills | Status: DC

## 2019-06-15 ENCOUNTER — Other Ambulatory Visit: Payer: Self-pay | Admitting: Family Medicine

## 2019-06-16 ENCOUNTER — Encounter: Payer: Self-pay | Admitting: Family Medicine

## 2019-06-17 ENCOUNTER — Other Ambulatory Visit: Payer: Self-pay

## 2019-06-17 ENCOUNTER — Encounter: Payer: Self-pay | Admitting: Family Medicine

## 2019-06-17 MED ORDER — ALBUTEROL SULFATE HFA 108 (90 BASE) MCG/ACT IN AERS: 2.0000 | INHALATION_SPRAY | Freq: Four times a day (QID) | RESPIRATORY_TRACT | 2 refills | Status: DC | PRN

## 2019-06-18 ENCOUNTER — Encounter: Payer: Self-pay | Admitting: Family Medicine

## 2019-06-18 LAB — COLOGUARD: Cologuard: NEGATIVE

## 2019-06-18 NOTE — Progress Notes (Signed)
Please inform patient of the following:  Cologuard test is normal.

## 2019-06-23 ENCOUNTER — Ambulatory Visit (HOSPITAL_COMMUNITY)
Admission: EM | Admit: 2019-06-23 | Discharge: 2019-06-23 | Disposition: A | Discharge status: Home / Self Care | Payer: 59 | Attending: Internal Medicine | Admitting: Internal Medicine

## 2019-06-23 ENCOUNTER — Encounter (HOSPITAL_COMMUNITY): Payer: Self-pay

## 2019-06-23 ENCOUNTER — Ambulatory Visit (INDEPENDENT_AMBULATORY_CARE_PROVIDER_SITE_OTHER): Payer: 59

## 2019-06-23 ENCOUNTER — Other Ambulatory Visit: Payer: Self-pay

## 2019-06-23 ENCOUNTER — Telehealth: Payer: Self-pay | Admitting: Family Medicine

## 2019-06-23 DIAGNOSIS — R05 Cough: Secondary | ICD-10-CM

## 2019-06-23 DIAGNOSIS — J209 Acute bronchitis, unspecified: Secondary | ICD-10-CM

## 2019-06-23 DIAGNOSIS — R062 Wheezing: Secondary | ICD-10-CM | POA: Diagnosis not present

## 2019-06-23 MED ORDER — METHYLPREDNISOLONE 4 MG PO TBPK: ORAL_TABLET | ORAL | 0 refills | Status: DC

## 2019-06-23 MED ORDER — METHYLPREDNISOLONE 4 MG PO TBPK: ORAL_TABLET | Freq: Every day | ORAL | 0 refills | Status: DC

## 2019-06-23 MED ORDER — BENZONATATE 100 MG PO CAPS: 100.0000 mg | ORAL_CAPSULE | Freq: Three times a day (TID) | ORAL | 0 refills | Status: DC

## 2019-06-23 MED ORDER — MUCINEX 600 MG PO TB12: 600.0000 mg | ORAL_TABLET | Freq: Two times a day (BID) | ORAL | 0 refills | Status: DC

## 2019-06-23 NOTE — Telephone Encounter (Signed)
Patient following up from call this morning

## 2019-06-23 NOTE — ED Provider Notes (Signed)
[place]    CSN: [id number] Arrival date & time: 06/23/19  1533      History   Chief Complaint Chief Complaint  Patient presents with  . Nasal Congestion    HPI John [name] is a 64 y.o. male with a history of hypertension comes to urgent care with complaints of nasal congestion, wheezing, nonproductive cough of a few days duration.  Patient was evaluated yesterday and turned out negative for COVID-19.  No fever or chills.  He denies any chest pain or chest pressure.  No chest tightness.  Wheezing is worse in the early part of the day but seems to improve as the day goes on.  No dizziness, near syncope or syncopal episodes.  No chest pain.   Patient has albuterol inhaler at home which he has been using with some relief.  HPI  Past Medical History:  Diagnosis Date  . Afib ([place])   . Anxiety and depression   . Asthma   . Emphysema of lung ([place])   . GERD (gastroesophageal reflux disease)   . Hernia    right inguinal  . History of bronchitis   . Hyperlipidemia   . Hypertension   . Morton's neuroma   . OA (osteoarthritis)    neck  . Obstructive sleep apnea on CPAP   . Pancreatitis   . Torn meniscus    Left    Patient Active Problem List   Diagnosis Date Noted  . Left trigger finger 08/26/2018  . Dyslipidemia 04/23/2018  . Hyperglycemia 04/23/2018  . Essential hypertension 03/04/2018  . Chronic low back pain with sciatica 03/04/2018  . COPD (chronic obstructive pulmonary disease) ([place]) 03/04/2018  . Allergic rhinitis 03/04/2018  . Anxiety 03/04/2018  . Erectile dysfunction 03/04/2018  . GERD (gastroesophageal reflux disease) 03/04/2018  . PAF (paroxysmal atrial fibrillation) ([place]) 03/28/2017    Past Surgical History:  Procedure Laterality Date  . BONE CYST EXCISION  1991   left wrist  . COLONOSCOPY    . HERNIA REPAIR  04/14/11   right inguinal  . HERNIA REPAIR     left inguinal, december 2020       Home Medications    Prior to Admission  medications   Medication Sig Start Date End Date Taking? Authorizing Provider  albuterol (VENTOLIN HFA) 108 (90 Base) MCG/ACT inhaler Inhale 2 puffs into the lungs every 6 (six) hours as needed for wheezing or shortness of breath. 06/17/19   Vivi Barrack, MD  ASMANEX, 30 METERED DOSES, 220 MCG/INH inhaler TAKE 1 PUFF BY MOUTH EVERY DAY 06/16/19   Vivi Barrack, MD  benzonatate (TESSALON) 100 MG capsule Take 1 capsule (100 mg total) by mouth every 8 (eight) hours. 06/23/19   LampteyMyrene Galas, MD  cholecalciferol (VITAMIN D3) 25 MCG (1000 UT) tablet Take 1,000 Units by mouth 2 (two) times daily.    [provider]  clonazePAM (KLONOPIN) 0.5 MG tablet Take 1 tablet (0.5 mg total) by mouth at bedtime as needed for anxiety. 12/09/18   Vivi Barrack, MD  Coenzyme Q10 (CO Q 10) 100 MG CAPS Take by mouth.    [provider]  diltiazem (CARDIZEM) 30 MG tablet Take one tablet by mouth every 4 hours AS NEEDED for A-fib HR > 100 as long as BP > 100. **NEEDS APPT 08/08/18   Sherran Needs, NP  esomeprazole (NEXIUM) 20 MG capsule Take 20 mg by mouth daily at 12 noon.    [provider]  gabapentin (NEURONTIN) 100 MG capsule TAKE 2 CAPSULES BY MOUTH AT BEDTIME 09/17/18   Lyndal Pulley, DO  guaiFENesin (MUCINEX) 600 MG 12 hr tablet Take 1 tablet (600 mg total) by mouth 2 (two) times daily. 06/23/19   LampteyMyrene Galas, MD  hydrOXYzine (ATARAX/VISTARIL) 25 MG tablet Take 1 - 2  Tablets  3 times daily as needed for anxiety. 11/12/18   Vivi Barrack, MD  ipratropium (ATROVENT) 0.06 % nasal spray USE 2 SPRAYS IN EACH NOSTRIL UP TO THREE TIMES DAILY 05/12/19   Vivi Barrack, MD  JUBLIA 10 % SOLN APPLY EXTERNALLY TO THE AFFECTED AREA DAILY 05/01/19   [provider]  meloxicam (MOBIC) 15 MG tablet Take 1 tablet (15 mg total) by mouth daily. 04/15/19   Vivi Barrack, MD  methocarbamol (ROBAXIN) 500 MG tablet Take 500 mg by mouth as needed for muscle spasms.    [provider]    methylPREDNISolone (MEDROL DOSEPAK) 4 MG TBPK tablet Take by mouth daily for 5 days. 06/23/19 06/28/19  Chase Picket, MD  montelukast (SINGULAIR) 10 MG tablet Take 1 tablet (10 mg total) by mouth daily. 06/09/19 09/07/19  Vivi Barrack, MD  Multiple Vitamins-Minerals (MULTIVITAMIN WITH MINERALS) tablet Take 1 tablet by mouth daily.      [provider]  olmesartan (BENICAR) 40 MG tablet Take 1 tablet (40 mg total) by mouth daily. 06/09/19   Vivi Barrack, MD  Omega-3 Fatty Acids (FISH OIL) 1200 MG CAPS Take by mouth.    [provider]  Plant Sterol Stanol-Pantethine 300-100 MG CAPS Take by mouth.    [provider]  sildenafil (VIAGRA) 100 MG tablet Take 1 tablet (100 mg total) by mouth daily as needed for erectile dysfunction. 05/08/19   Vivi Barrack, MD  Specialty Vitamins Products (MAGNESIUM, AMINO ACID CHELATE,) 133 MG tablet Take 1 tablet by mouth 2 (two) times a week.     [provider]  Tiotropium Bromide Monohydrate (SPIRIVA RESPIMAT) 1.25 MCG/ACT AERS Inhale 2 puffs into the lungs daily. 05/02/19   Vivi Barrack, MD  traMADol Veatrice [place]bon) 50 MG tablet TAKE 1 TABLET BY MOUTH TWICE A DAY 01/14/19   Vivi Barrack, MD  Triamcinolone Acetonide (NASACORT ALLERGY 24HR NA) Place into the nose.    [provider]  Turmeric 450 MG CAPS Take by mouth.    [provider]  vitamin B-12 (CYANOCOBALAMIN) 100 MCG tablet Take 50 mcg by mouth daily.      [provider]  Vitamin D, Ergocalciferol, (DRISDOL) 1.25 MG (50000 UNIT) CAPS capsule TAKE 1 CAPSULE (50,000 UNITS TOTAL) BY MOUTH EVERY 7 (SEVEN) DAYS. 05/12/19   Lyndal Pulley, DO  vitamin E 400 UNIT capsule Take 400 Units by mouth daily.    [provider]    Family History Family History  Problem Relation Age of Onset  . Hypertension Mother   . Dementia Other   . Hypertension Sister   . Hypertension Brother     Social History Social History   Tobacco Use  .  Smoking status: Former Smoker    Packs/day: 1.50  . Smokeless tobacco: Never Used  Substance Use Topics  . Alcohol use: Not Currently  . Drug use: No     Allergies   Patient has no known allergies.   Review of Systems Review of Systems  Constitutional: Negative for activity change, diaphoresis, fatigue and fever.  HENT: Positive for congestion and rhinorrhea. Negative for postnasal drip,  sinus pressure, sinus pain and sore throat.   Eyes: Negative.   Respiratory: Positive for chest tightness, shortness of breath and wheezing.   Gastrointestinal: Negative.   Skin: Negative.   Neurological: Negative for dizziness, weakness, light-headedness and headaches.     Physical Exam Triage Vital Signs ED Triage Vitals  Enc Vitals Group     BP 06/23/19 1649 (!) 141/91     Pulse Rate 06/23/19 1649 90     Resp 06/23/19 1649 18     Temp 06/23/19 1649 98.2 F (36.8 C)     Temp Source 06/23/19 1649 Oral     SpO2 06/23/19 1649 95 %     Weight --      Height --      Head Circumference --      Peak Flow --      Pain Score 06/23/19 1646 0     Pain Loc --      Pain Edu? --      Excl. in [place]? --    No data found.  Updated Vital Signs BP (!) 141/91 (BP Location: Right Arm)   Pulse 90   Temp 98.2 F (36.8 C) (Oral)   Resp 18   SpO2 95%   Visual Acuity Right Eye Distance:   Left Eye Distance:   Bilateral Distance:    Right Eye Near:   Left Eye Near:    Bilateral Near:     Physical Exam Vitals and nursing note reviewed.  Constitutional:      Appearance: He is not ill-appearing or diaphoretic.  Cardiovascular:     Rate and Rhythm: Normal rate and regular rhythm.     Pulses: Normal pulses.     Heart sounds: No murmur. No friction rub.  Pulmonary:     Breath sounds: No stridor. Wheezing present. No rhonchi or rales.  Chest:     Chest wall: No tenderness.  Abdominal:     General: Bowel sounds are normal. There is no distension.     Palpations: Abdomen is soft.      Tenderness: There is no guarding or rebound.  Musculoskeletal:        General: No swelling or signs of injury. Normal range of motion.     Right lower leg: No edema.  Skin:    General: Skin is warm.     Capillary Refill: Capillary refill takes less than 2 seconds.     Coloration: Skin is not jaundiced.     Findings: No erythema or lesion.  Neurological:     General: No focal deficit present.     Mental Status: He is alert and oriented to person, place, and time.      UC Treatments / Results  Labs (all labs ordered are listed, but only abnormal results are displayed) Labs Reviewed - No data to display  EKG   Radiology No results found.  Procedures Procedures (including critical care time)  Medications Ordered in UC Medications - No data to display  Initial Impression / Assessment and Plan / UC Course  I have reviewed the triage vital signs and the nursing notes.  Pertinent labs & imaging results that were available during my care of the patient were reviewed by me and considered in my medical decision making (see chart for details).     1.  Acute bronchitis with bronchospasm: Chest x-ray is negative for acute lung infiltrate Medrol Dosepak Tessalon Perles as needed for cough Mucinex 600 mg twice daily If patient develops worsening  symptoms, purulent cough, fever/chills that she is advised to return to urgent care to be reevaluated. Final Clinical Impressions(s) / UC Diagnoses   Final diagnoses:  None   Discharge Instructions   None    ED Prescriptions    Medication Sig Dispense Auth. Provider   benzonatate (TESSALON) 100 MG capsule Take 1 capsule (100 mg total) by mouth every 8 (eight) hours. 21 capsule Edwena Mayorga, Myrene Galas, MD   guaiFENesin (MUCINEX) 600 MG 12 hr tablet Take 1 tablet (600 mg total) by mouth 2 (two) times daily. 40 tablet Peter Keyworth, Myrene Galas, MD   methylPREDNISolone (MEDROL DOSEPAK) 4 MG TBPK tablet Take by mouth daily for 5 days. 5 tablet  Zurii Hewes, Myrene Galas, MD     PDMP not reviewed this encounter.   Chase Picket, MD 06/23/19 [phone]

## 2019-06-23 NOTE — Telephone Encounter (Signed)
Patient calling back about appt

## 2019-06-23 NOTE — Telephone Encounter (Signed)
Patient called in this morning requesting an appointment for a bad cough, offered virtual but John [name] states that he sometimes will have these episodes and will be prescribed albuterol (VENTOLIN HFA) 108 (90 Base) MCG/ACT inhaler which helped for a couple

## 2019-06-23 NOTE — ED Triage Notes (Signed)
Pt is here with nasal congestion & SOB sometimes, states he was COVID tested yesterday & his results were NEGATIVE.

## 2019-06-23 NOTE — Telephone Encounter (Signed)
I have spoken with patient in regard.  I have explained to him [facility].  Patient was not happy with [facility].  I did express my empathy for him not feeling well and needing to be seen.  Patient is going to go to UC refusing a virtual visit.

## 2019-06-23 NOTE — Telephone Encounter (Signed)
..  couple of days, has negative test and would like to come in office to see Dr.Parker.

## 2019-06-23 NOTE — Telephone Encounter (Signed)
Patient scheduled for virtual 06/24/19

## 2019-06-23 NOTE — Telephone Encounter (Signed)
FYI

## 2019-06-24 ENCOUNTER — Ambulatory Visit: Payer: 59 | Admitting: Family Medicine

## 2019-07-21 ENCOUNTER — Other Ambulatory Visit: Payer: Self-pay | Admitting: Family Medicine

## 2019-07-21 NOTE — Telephone Encounter (Signed)
LAST APPOINTMENT DATE: 05/02/2019 NEXT APPOINTMENT DATE:@Visit  date not found  Rx Tramadol 50mg  LAST REFILL:01/14/2019  QTY:60 2 Rf

## 2019-08-18 ENCOUNTER — Other Ambulatory Visit: Payer: Self-pay | Admitting: Family Medicine

## 2019-08-18 ENCOUNTER — Other Ambulatory Visit: Payer: Self-pay

## 2019-08-18 ENCOUNTER — Ambulatory Visit (INDEPENDENT_AMBULATORY_CARE_PROVIDER_SITE_OTHER): Payer: 59 | Admitting: Pulmonary Disease

## 2019-08-18 ENCOUNTER — Encounter: Payer: Self-pay | Admitting: Pulmonary Disease

## 2019-08-18 VITALS — BP 132/90 | HR 96 | Ht 71.5 in | Wt 203.0 lb

## 2019-08-18 DIAGNOSIS — R0602 Shortness of breath: Secondary | ICD-10-CM | POA: Diagnosis not present

## 2019-08-18 DIAGNOSIS — U070 Vaping-related disorder: Secondary | ICD-10-CM

## 2019-08-18 DIAGNOSIS — J310 Chronic rhinitis: Secondary | ICD-10-CM

## 2019-08-18 DIAGNOSIS — Z87891 Personal history of nicotine dependence: Secondary | ICD-10-CM

## 2019-08-18 DIAGNOSIS — T485X5A Adverse effect of other anti-common-cold drugs, initial encounter: Secondary | ICD-10-CM

## 2019-08-18 NOTE — Patient Instructions (Addendum)
Thank you for visiting Dr. Valeta Harms at [facility]. Today we recommend the following:  Orders Placed This Encounter  Procedures  . Pulmonary Function Test   Continue your current inhaler We may make modifications pending your pfts.   You must quit smoking or vaping. This is the single most important thing that you can do to improve your lung health.   S = Set a quit date. T = Tell family, friends, and the people around you that you plan to quit. A = Anticipate or plan ahead for the tough times you'll face while quitting. R = Remove cigarettes and other tobacco products from your home, car, and work T = Talk to [place] about getting help to quit  If you need help feel free to reach out to our office, [place] [facility]: [phone], call 1-800-QUIT-NOW, or visit [email or web address]   Return in about 4 weeks (around 09/15/2019) for with APP.    Please do your part to reduce the spread of COVID-19.

## 2019-08-18 NOTE — Progress Notes (Signed)
Synopsis: Referred in May 2021 self-referral for COPD, PCP: By Vivi Barrack, MD  Subjective:   PATIENT ID: John [name] GENDER: male DOB: 1955-12-03, MRN: [id number]  Chief Complaint  Patient presents with  . Consult    Pt is being referred due to history of fatigue which began 5 months ago. Pt has also had complaints of SOB and an occ cough.    This is a 64 year old gentleman past medical history of emphysema, asthma, atrial fibrillation, hypertension hyperlipidemia presents to pulmonary clinic for evaluation of COPD.  Patient is a former smoker.  He has not had any prior PFTs.  Previous echocardiogram in 2018 with normal ejection fraction 55 to [id number], grade 1 diastolic dysfunction.  OV 08/18/2019: back in march had URI, lingering cough for 6 weeks. Former smoker 38 years, quit 2015, replaced with vaping. He is still vaping at this point.  Overall he is improved since his exacerbation in March.  He has had previous exacerbations treated with steroids.  He does have previous events in which steroids were given to him he developed atrial fibrillation following this.  He is currently on Multaq.  He is not on anticoagulation.  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  He uses this multiple times a day.  When asked about how much he uses he was unwilling to disclose the exact amount of Afrin nasal spray he uses.  Does complain of fluid and pressure buildup in his ear.   Past Medical History:  Diagnosis Date  . Afib ([place])   . Anxiety and depression   . Asthma   . Emphysema of lung ([place])   . GERD (gastroesophageal reflux disease)   . Hernia    right inguinal  . History of bronchitis   . Hyperlipidemia   . Hypertension   . Morton's neuroma   . OA (osteoarthritis)    neck  . Obstructive sleep apnea on CPAP   . Pancreatitis   . Torn meniscus    Left     Family History  Problem Relation Age of Onset  . Hypertension Mother   . Dementia Other   . Hypertension Sister   .  Hypertension Brother      Past Surgical History:  Procedure Laterality Date  . BONE CYST EXCISION  1991   left wrist  . COLONOSCOPY    . HERNIA REPAIR  04/14/11   right inguinal  . HERNIA REPAIR     left inguinal, december 2020    Social History   Socioeconomic History  . Marital status: Divorced    Spouse name: Not on file  . Number of children: Not on file  . Years of education: Not on file  . Highest education level: Not on file  Occupational History  . Not on file  Tobacco Use  . Smoking status: Former Smoker    Packs/day: 1.50    Years: 38.00    Pack years: 57.00    Types: Cigarettes    Quit date: 2015    Years since quitting: 6.3  . Smokeless tobacco: Never Used  Substance and Sexual Activity  . Alcohol use: Not Currently  . Drug use: No  . Sexual activity: Yes    Partners: Female  Other Topics Concern  . Not on file  Social History Narrative  . Not on file   Social Determinants of Health   Financial Resource Strain:   . Difficulty of Paying Living Expenses:   Food Insecurity:   .  Worried About Charity fundraiser in the Last Year:   . Arboriculturist in the Last Year:   Transportation Needs:   . Film/video editor (Medical):   Marland Kitchen Lack of Transportation (Non-Medical):   Physical Activity:   . Days of Exercise per Week:   . Minutes of Exercise per Session:   Stress:   . Feeling of Stress :   Social Connections:   . Frequency of Communication with Friends and Family:   . Frequency of Social Gatherings with Friends and Family:   . Attends Religious Services:   . Active Member of Clubs or Organizations:   . Attends Archivist Meetings:   Marland Kitchen Marital Status:   Intimate Partner Violence:   . Fear of Current or Ex-Partner:   . Emotionally Abused:   Marland Kitchen Physically Abused:   . Sexually Abused:      No Known Allergies   Outpatient Medications Prior to Visit  Medication Sig Dispense Refill  . albuterol (VENTOLIN HFA) 108 (90 Base) MCG/ACT  inhaler Inhale 2 puffs into the lungs every 6 (six) hours as needed for wheezing or shortness of breath. 18 g 2  . ASMANEX, 30 METERED DOSES, 220 MCG/INH inhaler TAKE 1 PUFF BY MOUTH EVERY DAY 3 each 1  . cholecalciferol (VITAMIN D3) 25 MCG (1000 UT) tablet Take 1,000 Units by mouth 2 (two) times daily.    . clonazePAM (KLONOPIN) 0.5 MG tablet Take 1 tablet (0.5 mg total) by mouth at bedtime as needed for anxiety. 30 tablet 3  . esomeprazole (NEXIUM) 20 MG capsule Take 20 mg by mouth daily at 12 noon.    Marland Kitchen ipratropium (ATROVENT) 0.06 % nasal spray USE 2 SPRAYS IN EACH NOSTRIL UP TO THREE TIMES DAILY 15 mL 0  . JUBLIA 10 % SOLN APPLY EXTERNALLY TO THE AFFECTED AREA DAILY    . meloxicam (MOBIC) 15 MG tablet Take 1 tablet (15 mg total) by mouth daily. 30 tablet 0  . methocarbamol (ROBAXIN) 500 MG tablet Take 500 mg by mouth as needed for muscle spasms.    . montelukast (SINGULAIR) 10 MG tablet Take 1 tablet (10 mg total) by mouth daily. 90 tablet 1  . Multiple Vitamins-Minerals (MULTIVITAMIN WITH MINERALS) tablet Take 1 tablet by mouth daily.      Marland Kitchen olmesartan (BENICAR) 40 MG tablet Take 1 tablet (40 mg total) by mouth daily. 90 tablet 3  . Omega-3 Fatty Acids (FISH OIL) 1200 MG CAPS Take by mouth.    . Oxymetazoline HCl (AFRIN NASAL SPRAY NA) Place 1 spray into the nose as needed.    . Plant Sterol Stanol-Pantethine 300-100 MG CAPS Take by mouth.    . sildenafil (VIAGRA) 100 MG tablet Take 1 tablet (100 mg total) by mouth daily as needed for erectile dysfunction. 20 tablet 4  . Specialty Vitamins Products (MAGNESIUM, AMINO ACID CHELATE,) 133 MG tablet Take 1 tablet by mouth 2 (two) times a week.     . traMADol (ULTRAM) 50 MG tablet TAKE 1 TABLET BY MOUTH TWICE A DAY 60 tablet 5  . Turmeric 450 MG CAPS Take by mouth.    . vitamin B-12 (CYANOCOBALAMIN) 100 MCG tablet Take 50 mcg by mouth daily.      . vitamin E 400 UNIT capsule Take 400 Units by mouth daily.    Marland Kitchen diltiazem (CARDIZEM) 30 MG tablet  Take one tablet by mouth every 4 hours AS NEEDED for A-fib HR > 100 as long as BP >  100. **NEEDS APPT (Patient not taking: Reported on 08/18/2019) 30 tablet 0  . Tiotropium Bromide Monohydrate (SPIRIVA RESPIMAT) 1.25 MCG/ACT AERS Inhale 2 puffs into the lungs daily. (Patient not taking: Reported on 08/18/2019) 4 g 11  . benzonatate (TESSALON) 100 MG capsule Take 1 capsule (100 mg total) by mouth every 8 (eight) hours. 21 capsule 0  . Coenzyme Q10 (CO Q 10) 100 MG CAPS Take by mouth.    . gabapentin (NEURONTIN) 100 MG capsule TAKE 2 CAPSULES BY MOUTH AT BEDTIME 180 capsule 2  . guaiFENesin (MUCINEX) 600 MG 12 hr tablet Take 1 tablet (600 mg total) by mouth 2 (two) times daily. 40 tablet 0  . hydrOXYzine (ATARAX/VISTARIL) 25 MG tablet Take 1 - 2  Tablets  3 times daily as needed for anxiety. 90 tablet 1  . methylPREDNISolone (MEDROL DOSEPAK) 4 MG TBPK tablet Take as directed 21 tablet 0  . Triamcinolone Acetonide (NASACORT ALLERGY 24HR NA) Place into the nose.    . Vitamin D, Ergocalciferol, (DRISDOL) 1.25 MG (50000 UNIT) CAPS capsule TAKE 1 CAPSULE (50,000 UNITS TOTAL) BY MOUTH EVERY 7 (SEVEN) DAYS. 12 capsule 0   No facility-administered medications prior to visit.    Review of Systems  Constitutional: Negative for chills, fever, malaise/fatigue and weight loss.  HENT: Negative for hearing loss, sore throat and tinnitus.   Eyes: Negative for blurred vision and double vision.  Respiratory: Positive for cough and shortness of breath. Negative for hemoptysis, sputum production, wheezing and stridor.   Cardiovascular: Negative for chest pain, palpitations, orthopnea, leg swelling and PND.  Gastrointestinal: Negative for abdominal pain, constipation, diarrhea, heartburn, nausea and vomiting.  Genitourinary: Negative for dysuria, hematuria and urgency.  Musculoskeletal: Negative for joint pain and myalgias.  Skin: Negative for itching and rash.  Neurological: Negative for dizziness, tingling,  weakness and headaches.  Endo/Heme/Allergies: Negative for environmental allergies. Does not bruise/bleed easily.  Psychiatric/Behavioral: Negative for depression. The patient is not nervous/anxious and does not have insomnia.   All other systems reviewed and are negative.    Objective:  Physical Exam Vitals reviewed.  Constitutional:      General: He is not in acute distress.    Appearance: He is well-developed. He is obese.  HENT:     Head: Normocephalic and atraumatic.  Eyes:     General: No scleral icterus.    Conjunctiva/sclera: Conjunctivae normal.     Pupils: Pupils are equal, round, and reactive to light.  Neck:     Vascular: No JVD.     Trachea: No tracheal deviation.  Cardiovascular:     Rate and Rhythm: Normal rate and regular rhythm.     Heart sounds: Normal heart sounds. No murmur.  Pulmonary:     Effort: Pulmonary effort is normal. No tachypnea, accessory muscle usage or respiratory distress.     Breath sounds: No stridor. No wheezing, rhonchi or rales.  Abdominal:     General: Bowel sounds are normal. There is no distension.     Palpations: Abdomen is soft.     Tenderness: There is no abdominal tenderness.  Musculoskeletal:        General: No tenderness.     Cervical back: Neck supple.  Lymphadenopathy:     Cervical: No cervical adenopathy.  Skin:    General: Skin is warm and dry.     Capillary Refill: Capillary refill takes less than 2 seconds.     Findings: No rash.  Neurological:     Mental Status: He is alert  and oriented to person, place, and time.  Psychiatric:        Behavior: Behavior normal.      Vitals:   08/18/19 0945  BP: 132/90  Pulse: 96  SpO2: 99%  Weight: 203 lb (92.1 kg)  Height: 5' 11.5" (1.816 m)   99% on RA BMI Readings from Last 3 Encounters:  08/18/19 27.92 kg/m  05/02/19 27.43 kg/m  02/24/19 27.26 kg/m   Wt Readings from Last 3 Encounters:  08/18/19 203 lb (92.1 kg)  05/02/19 202 lb 4 oz (91.7 kg)  02/24/19 201  lb (91.2 kg)     CBC    Component Value Date/Time   WBC 6.6 05/02/2019 1432   RBC 4.98 05/02/2019 1432   HGB 14.9 05/02/2019 1432   HCT 43.9 05/02/2019 1432   PLT 179 05/02/2019 1432   MCV 88.2 05/02/2019 1432   MCH 29.9 05/02/2019 1432   MCHC 33.9 05/02/2019 1432   RDW 12.4 05/02/2019 1432   LYMPHSABS 1.7 08/17/2017 0035   MONOABS 1.1 (H) 08/17/2017 0035   EOSABS 0.2 08/17/2017 0035   BASOSABS 0.0 08/17/2017 0035     Chest Imaging: Chest x-ray 06/23/2019 2 view No acute abnormality, no infiltrate The patient's images have been independently reviewed by me.    Pulmonary Functions Testing Results: No flowsheet data found.  FeNO: none   Pathology: none   Echocardiogram: 2018: normal EF, G1DF  Heart Catheterization: None     Assessment & Plan:     ICD-10-CM   1. 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 Rhinitis medicamentosa  J31.0    T48.5X5A   4. SOB (shortness of breath)  R06.02     Discussion:  This is a 64 year old gentleman former smoker, currently vaping since quitting smoking in 2015.  He uses this regularly.  Also addicted to Afrin with recurrent rhinitis he also complains of fluid buildup in his ear likely has eustachian tube dysfunction because of recurrent Afrin and vaping.  Plan: Single most important thing he can do is quit abusing his upper airway and lungs with vaping and Afrin use. We discussed this at length today in the office. He also needs pulmonary function tests. I am not sure if he has a diagnosis of COPD or not. He can continue his current inhaler regimen and after his PFTs are complete we may make some recommendations regarding changing this. I also encouraged him to enroll in our lung cancer screening program due to his age and extensive smoking history. Greater than 50% of this patient's 46-minute office was spent face-to-face discussing above recommendations and treatment  plan.   Current Outpatient Medications:  .  albuterol (VENTOLIN HFA) 108 (90 Base) MCG/ACT inhaler, Inhale 2 puffs into the lungs every 6 (six) hours as needed for wheezing or shortness of breath., Disp: 18 g, Rfl: 2 .  ASMANEX, 30 METERED DOSES, 220 MCG/INH inhaler, TAKE 1 PUFF BY MOUTH EVERY DAY, Disp: 3 each, Rfl: 1 .  cholecalciferol (VITAMIN D3) 25 MCG (1000 UT) tablet, Take 1,000 Units by mouth 2 (two) times daily., Disp: , Rfl:  .  clonazePAM (KLONOPIN) 0.5 MG tablet, Take 1 tablet (0.5 mg total) by mouth at bedtime as needed for anxiety., Disp: 30 tablet, Rfl: 3 .  esomeprazole (NEXIUM) 20 MG capsule, Take 20 mg by mouth daily at 12 noon., Disp: , Rfl:  .  ipratropium (ATROVENT) 0.06 % nasal spray, USE 2 SPRAYS IN EACH NOSTRIL UP  TO THREE TIMES DAILY, Disp: 15 mL, Rfl: 0 .  JUBLIA 10 % SOLN, APPLY EXTERNALLY TO THE AFFECTED AREA DAILY, Disp: , Rfl:  .  meloxicam (MOBIC) 15 MG tablet, Take 1 tablet (15 mg total) by mouth daily., Disp: 30 tablet, Rfl: 0 .  methocarbamol (ROBAXIN) 500 MG tablet, Take 500 mg by mouth as needed for muscle spasms., Disp: , Rfl:  .  montelukast (SINGULAIR) 10 MG tablet, Take 1 tablet (10 mg total) by mouth daily., Disp: 90 tablet, Rfl: 1 .  Multiple Vitamins-Minerals (MULTIVITAMIN WITH MINERALS) tablet, Take 1 tablet by mouth daily.  , Disp: , Rfl:  .  olmesartan (BENICAR) 40 MG tablet, Take 1 tablet (40 mg total) by mouth daily., Disp: 90 tablet, Rfl: 3 .  Omega-3 Fatty Acids (FISH OIL) 1200 MG CAPS, Take by mouth., Disp: , Rfl:  .  Oxymetazoline HCl (AFRIN NASAL SPRAY NA), Place 1 spray into the nose as needed., Disp: , Rfl:  .  Plant Sterol Stanol-Pantethine 300-100 MG CAPS, Take by mouth., Disp: , Rfl:  .  sildenafil (VIAGRA) 100 MG tablet, Take 1 tablet (100 mg total) by mouth daily as needed for erectile dysfunction., Disp: 20 tablet, Rfl: 4 .  Specialty Vitamins Products (MAGNESIUM, AMINO ACID CHELATE,) 133 MG tablet, Take 1 tablet by mouth 2 (two) times  a week. , Disp: , Rfl:  .  traMADol (ULTRAM) 50 MG tablet, TAKE 1 TABLET BY MOUTH TWICE A DAY, Disp: 60 tablet, Rfl: 5 .  Turmeric 450 MG CAPS, Take by mouth., Disp: , Rfl:  .  vitamin B-12 (CYANOCOBALAMIN) 100 MCG tablet, Take 50 mcg by mouth daily.  , Disp: , Rfl:  .  vitamin E 400 UNIT capsule, Take 400 Units by mouth daily., Disp: , Rfl:  .  diltiazem (CARDIZEM) 30 MG tablet, Take one tablet by mouth every 4 hours AS NEEDED for A-fib HR > 100 as long as BP > 100. **NEEDS APPT (Patient not taking: Reported on 08/18/2019), Disp: 30 tablet, Rfl: 0 .  Tiotropium Bromide Monohydrate (SPIRIVA RESPIMAT) 1.25 MCG/ACT AERS, Inhale 2 puffs into the lungs daily. (Patient not taking: Reported on 08/18/2019), Disp: 4 g, Rfl: Boyd, DO [facility] 08/18/2019 10:10 AM

## 2019-08-20 ENCOUNTER — Telehealth: Payer: Self-pay | Admitting: Family Medicine

## 2019-08-20 NOTE — Telephone Encounter (Signed)
Patient has called in upset.  States that iprotropium is used as a part of his maintanance meds and would like to know why only one script was sent in and not 3 to his pharmacy?

## 2019-08-21 ENCOUNTER — Other Ambulatory Visit: Payer: Self-pay

## 2019-08-21 MED ORDER — IPRATROPIUM BROMIDE 0.06 % NA SOLN: 2.0000 | Freq: Three times a day (TID) | NASAL | 2 refills | Status: DC

## 2019-08-21 NOTE — Telephone Encounter (Signed)
Medication sent in. 

## 2019-08-21 NOTE — Telephone Encounter (Signed)
Ok to send in with 90 day supply with refills.  Algis Greenhouse. [name], MD 08/21/2019 9:12 AM

## 2019-08-22 ENCOUNTER — Encounter: Payer: Self-pay | Admitting: Family Medicine

## 2019-08-23 ENCOUNTER — Other Ambulatory Visit: Payer: Self-pay | Admitting: Family Medicine

## 2019-08-23 DIAGNOSIS — G8929 Other chronic pain: Secondary | ICD-10-CM

## 2019-08-25 ENCOUNTER — Other Ambulatory Visit: Payer: Self-pay

## 2019-08-25 DIAGNOSIS — J309 Allergic rhinitis, unspecified: Secondary | ICD-10-CM

## 2019-08-25 MED ORDER — IPRATROPIUM BROMIDE 0.06 % NA SOLN: NASAL | 3 refills | Status: DC

## 2019-09-24 ENCOUNTER — Telehealth: Payer: Self-pay | Admitting: *Deleted

## 2019-09-25 NOTE — Telephone Encounter (Signed)
Error

## 2019-09-26 ENCOUNTER — Other Ambulatory Visit: Payer: Self-pay | Admitting: Family Medicine

## 2019-10-17 ENCOUNTER — Other Ambulatory Visit: Payer: Self-pay | Admitting: Family Medicine

## 2019-10-24 ENCOUNTER — Other Ambulatory Visit (HOSPITAL_COMMUNITY)
Admission: RE | Admit: 2019-10-24 | Discharge: 2019-10-24 | Disposition: A | Discharge status: Home / Self Care | Payer: 59 | Source: Ambulatory Visit | Attending: Pulmonary Disease | Admitting: Pulmonary Disease

## 2019-10-24 DIAGNOSIS — Z20822 Contact with and (suspected) exposure to covid-19: Secondary | ICD-10-CM | POA: Insufficient documentation

## 2019-10-24 DIAGNOSIS — Z01812 Encounter for preprocedural laboratory examination: Secondary | ICD-10-CM | POA: Diagnosis present

## 2019-10-24 LAB — SARS CORONAVIRUS 2 (TAT 6-24 HRS): SARS Coronavirus 2: NEGATIVE

## 2019-10-27 ENCOUNTER — Ambulatory Visit (INDEPENDENT_AMBULATORY_CARE_PROVIDER_SITE_OTHER): Payer: 59 | Admitting: Pulmonary Disease

## 2019-10-27 ENCOUNTER — Encounter: Payer: Self-pay | Admitting: Acute Care

## 2019-10-27 ENCOUNTER — Other Ambulatory Visit: Payer: Self-pay

## 2019-10-27 ENCOUNTER — Ambulatory Visit (INDEPENDENT_AMBULATORY_CARE_PROVIDER_SITE_OTHER): Payer: 59 | Admitting: Acute Care

## 2019-10-27 VITALS — BP 130/82 | HR 87 | Temp 97.3°F | Ht 72.5 in | Wt 199.4 lb

## 2019-10-27 DIAGNOSIS — Z720 Tobacco use: Secondary | ICD-10-CM

## 2019-10-27 DIAGNOSIS — F17210 Nicotine dependence, cigarettes, uncomplicated: Secondary | ICD-10-CM | POA: Diagnosis not present

## 2019-10-27 DIAGNOSIS — J449 Chronic obstructive pulmonary disease, unspecified: Secondary | ICD-10-CM | POA: Diagnosis not present

## 2019-10-27 DIAGNOSIS — Z87891 Personal history of nicotine dependence: Secondary | ICD-10-CM

## 2019-10-27 DIAGNOSIS — J4489 Other specified chronic obstructive pulmonary disease: Secondary | ICD-10-CM

## 2019-10-27 LAB — PULMONARY FUNCTION TEST
DL/VA % pred: 102 %
DL/VA: 4.29 ml/min/mmHg/L
DLCO cor % pred: 97 %
DLCO cor: 27.14 ml/min/mmHg
DLCO unc % pred: 97 %
DLCO unc: 27.14 ml/min/mmHg
FEF 25-75 Post: 1.83 L/sec
FEF 25-75 Pre: 1.22 L/sec
FEF2575-%Change-Post: 49 %
FEF2575-%Pred-Post: 62 %
FEF2575-%Pred-Pre: 41 %
FEV1-%Change-Post: 12 %
FEV1-%Pred-Post: 69 %
FEV1-%Pred-Pre: 61 %
FEV1-Post: 2.5 L
FEV1-Pre: 2.23 L
FEV1FVC-%Change-Post: 0 %
FEV1FVC-%Pred-Pre: 86 %
FEV6-%Change-Post: 13 %
FEV6-%Pred-Post: 83 %
FEV6-%Pred-Pre: 73 %
FEV6-Post: 3.81 L
FEV6-Pre: 3.36 L
FEV6FVC-%Change-Post: 0 %
FEV6FVC-%Pred-Post: 103 %
FEV6FVC-%Pred-Pre: 102 %
FVC-%Change-Post: 12 %
FVC-%Pred-Post: 80 %
FVC-%Pred-Pre: 71 %
FVC-Post: 3.87 L
FVC-Pre: 3.44 L
Post FEV1/FVC ratio: 65 %
Post FEV6/FVC ratio: 99 %
Pre FEV1/FVC ratio: 65 %
Pre FEV6/FVC Ratio: 98 %
RV % pred: 142 %
RV: 3.3 L
TLC % pred: 97 %
TLC: 6.95 L

## 2019-10-27 MED ORDER — TRELEGY ELLIPTA 100-62.5-25 MCG/INH IN AEPB: 1.0000 | INHALATION_SPRAY | Freq: Every day | RESPIRATORY_TRACT | 0 refills | Status: DC

## 2019-10-27 NOTE — Progress Notes (Signed)
History of Present Illness John [name] is a 64 y.o. male current every day vaper, former tobacco with Moderate COPD and asthma. He is followed by Dr. Valeta Harms.    10/27/2019  Pt. Presents for follow up. PFT's show moderate COPD with a possible asthma overlap.  He states he is doing well. He feels his asthmanex is not helping with his shortness of breath.  He is continuing to vape. We discussed that this is contributing to his COPD and asthma flares. We discussed that he needs to reduce and quit smoking all together. He states he has minimal secretions. He is interested in a Therapeutic Trial of Trelegy. This is preferred over Breztri by his insurance plan. He states he uses his rescue inhaler about once a week.  Pt. Has not had the COVID vaccine. We did discuss the fact he is in an at risk population for having worse outcomes should he catch Covid. He states he is willing to take this risk. I told him to call if he changes his mind.  He is continuing to use Afrin daily. He states he is trying to cut down.  I have asked him to consider Lung Cancer Screening again. He denies fever, chest pain, orthopnea or hemoptysis.   Test Results:  PFT's 10/27/2019       CBC Latest Ref Rng & Units 05/02/2019 04/22/2018 08/17/2017  WBC 3.8 - 10.8 Thousand/uL 6.6 5.6 8.2  Hemoglobin 13.2 - 17.1 g/dL 14.9 14.3 13.9  Hematocrit 38 - 50 % 43.9 42.3 42.1  Platelets 140 - 400 Thousand/uL 179 186.0 181    BMP Latest Ref Rng & Units 05/02/2019 04/22/2018 08/17/2017  Glucose 65 - 99 mg/dL 94 101(H) 74  BUN 7 - 25 mg/dL 24 19 23(H)  Creatinine 0.70 - 1.25 mg/dL 1.02 0.96 0.99  BUN/Creat Ratio 6 - 22 (calc) NOT APPLICABLE - -  Sodium 948 - 146 mmol/L 138 139 139  Potassium 3.5 - 5.3 mmol/L 4.6 4.5 4.3  Chloride 98 - 110 mmol/L 104 104 106  CO2 20 - 32 mmol/L 25 27 26   Calcium 8.6 - 10.3 mg/dL 9.8 9.6 8.7(L)    BNP No results found for: BNP  ProBNP No results found for: PROBNP  PFT    Component Value  Date/Time   FEV1PRE 2.23 10/27/2019 0902   FEV1POST 2.50 10/27/2019 0902   FVCPRE 3.44 10/27/2019 0902   FVCPOST 3.87 10/27/2019 0902   TLC 6.95 10/27/2019 0902   DLCOUNC 27.14 10/27/2019 0902   PREFEV1FVCRT 65 10/27/2019 0902   PSTFEV1FVCRT 65 10/27/2019 0902    No results found.   Past medical hx Past Medical History:  Diagnosis Date  . Afib ([place])   . Anxiety and depression   . Asthma   . Emphysema of lung ([place])   . GERD (gastroesophageal reflux disease)   . Hernia    right inguinal  . History of bronchitis   . Hyperlipidemia   . Hypertension   . Morton's neuroma   . OA (osteoarthritis)    neck  . Obstructive sleep apnea on CPAP   . Pancreatitis   . Torn meniscus    Left     Social History   Tobacco Use  . Smoking status: Former Smoker    Packs/day: 1.50    Years: 38.00    Pack years: 57.00    Types: Cigarettes    Quit date: 2015    Years since quitting: 6.5  . Smokeless tobacco: Never Used  Vaping  Use  . Vaping Use: Every day  Substance Use Topics  . Alcohol use: Not Currently  . Drug use: No    Mr.Sakamoto reports that he quit smoking about 6 years ago. His smoking use included cigarettes. He has a 57.00 pack-year smoking history. He has never used smokeless tobacco. He reports previous alcohol use. He reports that he does not use drugs.  Tobacco Cessation: Current Every Day Vaper Tobacco Smoker quit 6 years ago with 42 pack years history  Past surgical hx, Family hx, Social hx all reviewed.  Current Outpatient Medications on File Prior to Visit  Medication Sig  . albuterol (VENTOLIN HFA) 108 (90 Base) MCG/ACT inhaler Inhale 2 puffs into the lungs every 6 (six) hours as needed for wheezing or shortness of breath.  . ASMANEX, 30 METERED DOSES, 220 MCG/INH inhaler INHALE 1 PUFF BY MOUTH EVERY DAY  . cholecalciferol (VITAMIN D3) 25 MCG (1000 UT) tablet Take 1,000 Units by mouth 2 (two) times daily.  . clonazePAM (KLONOPIN) 0.5 MG tablet Take 1 tablet  (0.5 mg total) by mouth at bedtime as needed for anxiety.  Marland Kitchen diltiazem (CARDIZEM) 30 MG tablet Take one tablet by mouth every 4 hours AS NEEDED for A-fib HR > 100 as long as BP > 100. **NEEDS APPT  . esomeprazole (NEXIUM) 20 MG capsule Take 20 mg by mouth daily at 12 noon.  Marland Kitchen ipratropium (ATROVENT) 0.06 % nasal spray USE 2 SPRAYS IN EACH NOSTRIL UP TO THREE TIMES DAILY.  . JUBLIA 10 % SOLN APPLY EXTERNALLY TO THE AFFECTED AREA DAILY  . meloxicam (MOBIC) 15 MG tablet TAKE 1 TABLET BY MOUTH EVERY DAY  . methocarbamol (ROBAXIN) 500 MG tablet Take 500 mg by mouth as needed for muscle spasms.  . montelukast (SINGULAIR) 10 MG tablet TAKE 1 TABLET BY MOUTH EVERY DAY  . Multiple Vitamins-Minerals (MULTIVITAMIN WITH MINERALS) tablet Take 1 tablet by mouth daily.    Marland Kitchen olmesartan (BENICAR) 40 MG tablet Take 1 tablet (40 mg total) by mouth daily.  . Omega-3 Fatty Acids (FISH OIL) 1200 MG CAPS Take by mouth.  . Oxymetazoline HCl (AFRIN NASAL SPRAY NA) Place 1 spray into the nose as needed.  . Plant Sterol Stanol-Pantethine 300-100 MG CAPS Take by mouth.  . sildenafil (VIAGRA) 100 MG tablet Take 1 tablet (100 mg total) by mouth daily as needed for erectile dysfunction.  Marland Kitchen Specialty Vitamins Products (MAGNESIUM, AMINO ACID CHELATE,) 133 MG tablet Take 1 tablet by mouth 2 (two) times a week.   . traMADol (ULTRAM) 50 MG tablet TAKE 1 TABLET BY MOUTH TWICE A DAY  . Turmeric 450 MG CAPS Take by mouth.  . vitamin B-12 (CYANOCOBALAMIN) 100 MCG tablet Take 50 mcg by mouth daily.    . vitamin E 400 UNIT capsule Take 400 Units by mouth daily.   No current facility-administered medications on file prior to visit.     No Known Allergies  Review Of Systems:  Constitutional:   No  weight loss, night sweats,  Fevers, chills, fatigue, or  lassitude.  HEENT:   No headaches,  Difficulty swallowing,  Tooth/dental problems, or  Sore throat,                No sneezing, itching, ear ache, nasal congestion, post nasal  drip,   CV:  No chest pain,  Orthopnea, PND, swelling in lower extremities, anasarca, dizziness, palpitations, syncope.   GI  No heartburn, indigestion, abdominal pain, nausea, vomiting, diarrhea, change in bowel habits, loss of  appetite, bloody stools.   Resp: No shortness of breath with exertion or at rest.  No excess mucus, no productive cough,  No non-productive cough,  No coughing up of blood.  No change in color of mucus.  No wheezing.  No chest wall deformity  Skin: no rash or lesions.  GU: no dysuria, change in color of urine, no urgency or frequency.  No flank pain, no hematuria   MS:  No joint pain or swelling.  No decreased range of motion.  No back pain.  Psych:  No change in mood or affect. No depression or anxiety.  No memory loss.   Vital Signs BP 130/82 (BP Location: Left Arm, Cuff Size: Normal)   Pulse 87   Temp (!) 97.3 F (36.3 C) (Oral)   Ht 6' 0.5" (1.842 m)   Wt 199 lb 6.4 oz (90.4 kg)   SpO2 95%   BMI 26.67 kg/m    Physical Exam:  General- No distress,  A&Ox3, pleasant ENT: No sinus tenderness, TM clear, pale nasal mucosa, no oral exudate,no post nasal drip, no LAN Cardiac: S1, S2, regular rate and rhythm, no murmur Chest: No wheeze/ rales/ dullness; no accessory muscle use, no nasal flaring, no sternal retractions Abd.: Soft Non-tender, ND, BS+ Ext: No clubbing cyanosis, edema Neuro:  normal strength, MAE x 4, A&O x 3 Skin: No rashes, No lesions, warm and dry Psych: normal mood and behavior   Assessment/Plan  COPD ( Moderate) Asthma overlap Syndrome Plan We will do a trial of Trelegy. One puff once daily. Rinse mouth after use. Let us know if you want [place] to send in a prescription . Continue using your rescue inhaler as needed for shortness of breath or wheezing.  Note your daily symptoms > remember "red flags" for COPD:  Increase in cough, increase in sputum production, increase in shortness of breath or activity intolerance. If you notice  these symptoms, please call to be seen.  Let us know if you change your mind about getting the COVID vaccine. Please contact office for sooner follow up if symptoms do not improve or worsen or seek emergency care  Follow up with Babita Amaker NP or Dr. Valeta Harms in 2 weeks to see if you like the Trelegy.   Tobacco/ Nicotine Abuse Continues to Vape daily Former tobacco smoker with a 56 pack year smoking history Plan Work on quitting Vaping.  Consider Lung Cancer Screening  Magdalen Spatz, NP 10/27/2019  10:05 AM

## 2019-10-27 NOTE — Progress Notes (Signed)
PFT done today. 

## 2019-10-27 NOTE — Patient Instructions (Addendum)
It is good to see you today.  You do have COPD, moderate per PFT's You have an overlap of asthma We will do a trial of Trelegy. One puff once daily. Rinse mouth after use. Let us know if you want [place] to send in a prescription . Continue using your rescue inhaler as needed for shortness of breath or wheezing.  Note your daily symptoms > remember "red flags" for COPD:  Increase in cough, increase in sputum production, increase in shortness of breath or activity intolerance. If you notice these symptoms, please call to be seen.  Let us know if you change your mind about getting the COVID vaccine. Work on quitting Vaping.  Please contact office for sooner follow up if symptoms do not improve or worsen or seek emergency care  Follow up with Karthikeya Funke NP or Dr. Valeta Harms in 2 weeks to see if you like the Trelegy.  Consider Lung Cancer Screening

## 2019-11-03 ENCOUNTER — Telehealth: Payer: Self-pay | Admitting: Pulmonary Disease

## 2019-11-03 MED ORDER — TRELEGY ELLIPTA 100-62.5-25 MCG/INH IN AEPB: 1.0000 | INHALATION_SPRAY | Freq: Every day | RESPIRATORY_TRACT | 1 refills | Status: DC

## 2019-11-03 NOTE — Telephone Encounter (Signed)
Spoke with pt and he states that Trelegy is helping and would like an rx sent to pharmacy. PT advised that refill has been sent and pt is aware to keep appt with Eric Form, Np on 12/01/19. Nothing further needed at this time.

## 2019-11-10 ENCOUNTER — Telehealth: Payer: Self-pay | Admitting: Pulmonary Disease

## 2019-11-10 MED ORDER — TRELEGY ELLIPTA 100-62.5-25 MCG/INH IN AEPB: 1.0000 | INHALATION_SPRAY | Freq: Every day | RESPIRATORY_TRACT | 2 refills | Status: DC

## 2019-11-10 NOTE — Telephone Encounter (Signed)
Refill of Trelegy, 90 day supply, sent to preferred pharmacy.

## 2019-11-17 ENCOUNTER — Ambulatory Visit: Payer: 59

## 2019-11-17 ENCOUNTER — Ambulatory Visit (INDEPENDENT_AMBULATORY_CARE_PROVIDER_SITE_OTHER): Payer: 59

## 2019-11-17 ENCOUNTER — Other Ambulatory Visit: Payer: Self-pay

## 2019-11-17 ENCOUNTER — Ambulatory Visit (INDEPENDENT_AMBULATORY_CARE_PROVIDER_SITE_OTHER): Payer: 59 | Admitting: Podiatry

## 2019-11-17 DIAGNOSIS — M722 Plantar fascial fibromatosis: Secondary | ICD-10-CM

## 2019-11-17 DIAGNOSIS — D3610 Benign neoplasm of peripheral nerves and autonomic nervous system, unspecified: Secondary | ICD-10-CM

## 2019-11-17 DIAGNOSIS — B351 Tinea unguium: Secondary | ICD-10-CM | POA: Diagnosis not present

## 2019-11-17 NOTE — Progress Notes (Signed)
Orthotic note:  Pf (neutral heel, deep cup) w/ mortons neuroma offload 3/4 interspace L.. Dr Reece Agar, pls drop charges [phone]

## 2019-11-17 NOTE — Patient Instructions (Signed)

## 2019-11-18 ENCOUNTER — Other Ambulatory Visit: Payer: Self-pay | Admitting: Podiatry

## 2019-11-18 DIAGNOSIS — M722 Plantar fascial fibromatosis: Secondary | ICD-10-CM

## 2019-11-24 NOTE — Progress Notes (Signed)
Subjective:   Patient ID: John [name], male   DOB: 64 y.o.   MRN: [id number]   HPI 64 year old male presents the office today for concerns of heel pain to the palm of his left heel as well as on the back of the heel which has been on the past couple weeks.  He states that is painful to put pressure on it at times and is intermittent.  Is tried ibuprofen which has not been helpful.  Is looking to get orthotics today.  Has had these previously.  He is previously treated by Dr. Blenda Mounts for neuroma on the left third interspace and neuro pads were helpful.  He denies any recent injury or falls or any trauma to the foot.  No swelling.  No radiating pain or weakness.    He also has toenail fungus he is on Jublia which is been on for the last couple of months. He was asking about a compound cream that he was reading online to include urea.  No other concerns today.   Review of Systems  All other systems reviewed and are negative.  Past Medical History:  Diagnosis Date   Afib ([place])    Anxiety and depression    Asthma    Emphysema of lung ([place])    GERD (gastroesophageal reflux disease)    Hernia    right inguinal   History of bronchitis    Hyperlipidemia    Hypertension    Morton's neuroma    OA (osteoarthritis)    neck   Obstructive sleep apnea on CPAP    Pancreatitis    Torn meniscus    Left    Past Surgical History:  Procedure Laterality Date   BONE CYST EXCISION  1991   left wrist   COLONOSCOPY     HERNIA REPAIR  04/14/11   right inguinal   HERNIA REPAIR     left inguinal, december 2020     Current Outpatient Medications:    albuterol (VENTOLIN HFA) 108 (90 Base) MCG/ACT inhaler, Inhale 2 puffs into the lungs every 6 (six) hours as needed for wheezing or shortness of breath., Disp: 18 g, Rfl: 2   ASMANEX, 30 METERED DOSES, 220 MCG/INH inhaler, INHALE 1 PUFF BY MOUTH EVERY DAY, Disp: 3 each, Rfl: 1   cholecalciferol (VITAMIN D3) 25 MCG (1000 UT) tablet,  Take 1,000 Units by mouth 2 (two) times daily., Disp: , Rfl:    clonazePAM (KLONOPIN) 0.5 MG tablet, Take 1 tablet (0.5 mg total) by mouth at bedtime as needed for anxiety., Disp: 30 tablet, Rfl: 3   diltiazem (CARDIZEM) 30 MG tablet, Take one tablet by mouth every 4 hours AS NEEDED for A-fib HR > 100 as long as BP > 100. **NEEDS APPT, Disp: 30 tablet, Rfl: 0   esomeprazole (NEXIUM) 20 MG capsule, Take 20 mg by mouth daily at 12 noon., Disp: , Rfl:    Fluticasone-Umeclidin-Vilant (TRELEGY ELLIPTA) 100-62.5-25 MCG/INH AEPB, Inhale 1 puff into the lungs daily., Disp: 180 each, Rfl: 2   ipratropium (ATROVENT) 0.06 % nasal spray, USE 2 SPRAYS IN EACH NOSTRIL UP TO THREE TIMES DAILY., Disp: 15 mL, Rfl: 3   JUBLIA 10 % SOLN, APPLY EXTERNALLY TO THE AFFECTED AREA DAILY, Disp: , Rfl:    meloxicam (MOBIC) 15 MG tablet, TAKE 1 TABLET BY MOUTH EVERY DAY, Disp: 90 tablet, Rfl: 1   methocarbamol (ROBAXIN) 500 MG tablet, Take 500 mg by mouth as needed for muscle spasms., Disp: , Rfl:  montelukast (SINGULAIR) 10 MG tablet, TAKE 1 TABLET BY MOUTH EVERY DAY, Disp: 90 tablet, Rfl: 1   Multiple Vitamins-Minerals (MULTIVITAMIN WITH MINERALS) tablet, Take 1 tablet by mouth daily.  , Disp: , Rfl:    NON FORMULARY, Terbinafine, Disp: , Rfl:    olmesartan (BENICAR) 40 MG tablet, Take 1 tablet (40 mg total) by mouth daily., Disp: 90 tablet, Rfl: 3   Omega-3 Fatty Acids (FISH OIL) 1200 MG CAPS, Take by mouth., Disp: , Rfl:    Oxymetazoline HCl (AFRIN NASAL SPRAY NA), Place 1 spray into the nose as needed., Disp: , Rfl:    Plant Sterol Stanol-Pantethine 300-100 MG CAPS, Take by mouth., Disp: , Rfl:    sildenafil (VIAGRA) 100 MG tablet, Take 1 tablet (100 mg total) by mouth daily as needed for erectile dysfunction., Disp: 20 tablet, Rfl: 4   Specialty Vitamins Products (MAGNESIUM, AMINO ACID CHELATE,) 133 MG tablet, Take 1 tablet by mouth 2 (two) times a week. , Disp: , Rfl:    traMADol (ULTRAM) 50 MG  tablet, TAKE 1 TABLET BY MOUTH TWICE A DAY, Disp: 60 tablet, Rfl: 5   Turmeric 450 MG CAPS, Take by mouth., Disp: , Rfl:    vitamin B-12 (CYANOCOBALAMIN) 100 MCG tablet, Take 50 mcg by mouth daily.  , Disp: , Rfl:    vitamin E 400 UNIT capsule, Take 400 Units by mouth daily., Disp: , Rfl:   No Known Allergies        Objective:  Physical Exam  General: AAO x3, NAD  Dermatological: Nails are mildly hypertrophic, dystrophic with yellow-brown discoloration.  No pain of the nails no redness or drainage or signs of infection.  Vascular: Dorsalis Pedis artery and Posterior Tibial artery pedal pulses are 2/4 bilateral with immedate capillary fill time. There is no pain with calf compression, swelling, warmth, erythema.   Neruologic: Grossly intact via light touch bilateral. Negative tinel sign.    Musculoskeletal: Tenderness palpation of the plantar medial tubercle of the calcaneus and insertion of plantar fascia.  Plantar fascial appears to be intact.  No pain along the Achilles tendon is appear to be intact as well.  No pain with lateral compression of calcaneus.  No significant pain along the third interspace today.  There is no area of pinpoint tenderness.  Muscular strength 5/5 in all groups tested bilateral.  Gait: Unassisted, Nonantalgic.       Assessment:   Left foot plantar fasciitis     Plan:  -Treatment options discussed including all alternatives, risks, and complications -Etiology of symptoms were discussed -X-rays were obtained and reviewed with the patient.  No evidence of acute fracture or stress fracture identified today. -He was seen today by Liliane Channel for molding of orthotics -Discussed stretching, icing daily. -Continue meloxicam. -Discussed steroid injection today. -He was requesting a compound cream for the nail fungus.  Has been on Jublia.  I [facility] a compound cream [facility] for onychomycosis.

## 2019-11-25 ENCOUNTER — Encounter: Payer: Self-pay | Admitting: Podiatry

## 2019-12-01 ENCOUNTER — Ambulatory Visit: Payer: 59 | Admitting: Acute Care

## 2019-12-01 ENCOUNTER — Other Ambulatory Visit: Payer: 59 | Admitting: Orthotics

## 2019-12-03 ENCOUNTER — Telehealth: Payer: Self-pay | Admitting: Acute Care

## 2019-12-03 NOTE — Telephone Encounter (Signed)
Called and spoke with patient.  Patient stated he is using Trelegy with no problems, and feels he is breathing much better.  Patient is going to schedule follow up for November 2021.  Nothing further at this time.

## 2019-12-08 ENCOUNTER — Ambulatory Visit: Payer: 59 | Admitting: Acute Care

## 2019-12-22 ENCOUNTER — Other Ambulatory Visit: Payer: 59 | Admitting: Orthotics

## 2019-12-26 ENCOUNTER — Telehealth: Payer: Self-pay | Admitting: Pulmonary Disease

## 2019-12-26 NOTE — Telephone Encounter (Signed)
FYI: Due to multiple unsuccessful attempts to contact pt to schedule lung cancer screening this referral has been cancelled.

## 2019-12-26 NOTE — Telephone Encounter (Signed)
PCCM:  Thanks  Garner Nash, DO [facility] 12/26/2019 1:30 PM

## 2020-01-21 ENCOUNTER — Other Ambulatory Visit: Payer: Self-pay | Admitting: Family Medicine

## 2020-01-21 DIAGNOSIS — G8929 Other chronic pain: Secondary | ICD-10-CM

## 2020-01-21 DIAGNOSIS — M5440 Lumbago with sciatica, unspecified side: Secondary | ICD-10-CM

## 2020-01-22 ENCOUNTER — Other Ambulatory Visit: Payer: Self-pay | Admitting: *Deleted

## 2020-01-22 NOTE — Telephone Encounter (Signed)
LAST APPOINTMENT DATE: 05/02/2019   NEXT APPOINTMENT DATE: 05/06/2020

## 2020-01-28 ENCOUNTER — Encounter: Payer: Self-pay | Admitting: Family Medicine

## 2020-02-16 ENCOUNTER — Telehealth: Payer: Self-pay | Admitting: Podiatry

## 2020-02-16 DIAGNOSIS — M722 Plantar fascial fibromatosis: Secondary | ICD-10-CM

## 2020-02-16 NOTE — Telephone Encounter (Signed)
Thanks

## 2020-02-16 NOTE — Telephone Encounter (Signed)
Pt called stating he left message last week and is wanting to order another pair of orthotics just like the last ones. No one called him back and I apologized stating I was out of the office all last week unexpectedly. I told pt I would give him a call when they come in.

## 2020-02-23 ENCOUNTER — Other Ambulatory Visit: Payer: Self-pay

## 2020-02-23 ENCOUNTER — Ambulatory Visit (INDEPENDENT_AMBULATORY_CARE_PROVIDER_SITE_OTHER): Payer: 59 | Admitting: Family Medicine

## 2020-02-23 ENCOUNTER — Other Ambulatory Visit: Payer: Self-pay | Admitting: Family Medicine

## 2020-02-23 ENCOUNTER — Encounter: Payer: Self-pay | Admitting: Family Medicine

## 2020-02-23 DIAGNOSIS — M542 Cervicalgia: Secondary | ICD-10-CM

## 2020-02-23 MED ORDER — GABAPENTIN 100 MG PO CAPS: 100.0000 mg | ORAL_CAPSULE | Freq: Every day | ORAL | 0 refills | Status: DC

## 2020-02-23 NOTE — Progress Notes (Addendum)
Virtual Visit via Video Note  I connected with John [name] on 02/23/20 at  4:15 PM EST by a video enabled telemedicine application and verified that I am speaking with the correct person using two identifiers.  Patient is alone the call  Location: Patient: In home setting Provider: In office setting   I discussed the limitations of evaluation and management by telemedicine and the availability of in person appointments. The patient expressed understanding and agreed to proceed.  History of Present Illness: Patient has had increase in back pain since last visit on 08/26/2018. 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 He states that the few sessions that he has had have not improved his pain. Patient states that he had prescription of gabapentin from last year but that it does not seem to be working. He wonders if that is due to the fact that it is expired.  Patient states initially when he started taking it he did have some nightmares.  Observations/Objective: Alert and oriented x3   Assessment and Plan: 64 year old gentleman who was found to have more of a cervical radiculopathy.  Questionable also carpal tunnel which patient has not been seen by me in quite some time.  Patient did respond initially to gabapentin but did have some nightmares initially.  Will continue with the 100 mg at this time.  We discussed with him I would rather see patient in the office before we make any other significant changes.  Patient knows if it worsening he needs to call sooner otherwise he is scheduled for November 30 do not feel comfortable doing anything stronger to further evaluate.  Unfortunately patient has any type of exacerbation needs to seek medical care immediately.   Follow Up Instructions: November 30 and 245    I discussed the assessment and treatment plan with the patient. The patient was provided an opportunity to  ask questions and all were answered. The patient agreed with the plan and demonstrated an understanding of the instructions.   The patient was advised to call back or seek an in-person evaluation if the symptoms worsen or if the condition fails to improve as anticipated.  I provided 21 minutes of non-face-to-face time during this encounter.   Lyndal Pulley, DO

## 2020-02-23 NOTE — Telephone Encounter (Signed)
Appt scheduled for today.

## 2020-02-23 NOTE — Telephone Encounter (Signed)
Left message for patient to call back to schedule virtual visit.  

## 2020-03-09 ENCOUNTER — Ambulatory Visit (INDEPENDENT_AMBULATORY_CARE_PROVIDER_SITE_OTHER): Payer: 59 | Admitting: Family Medicine

## 2020-03-09 ENCOUNTER — Encounter: Payer: Self-pay | Admitting: Family Medicine

## 2020-03-09 ENCOUNTER — Ambulatory Visit (INDEPENDENT_AMBULATORY_CARE_PROVIDER_SITE_OTHER): Payer: 59

## 2020-03-09 ENCOUNTER — Other Ambulatory Visit: Payer: Self-pay

## 2020-03-09 VITALS — BP 132/96 | HR 107 | Ht 72.0 in | Wt 206.0 lb

## 2020-03-09 DIAGNOSIS — M542 Cervicalgia: Secondary | ICD-10-CM

## 2020-03-09 DIAGNOSIS — M5010 Cervical disc disorder with radiculopathy, unspecified cervical region: Secondary | ICD-10-CM | POA: Diagnosis not present

## 2020-03-09 MED ORDER — TRAZODONE HCL 50 MG PO TABS: 25.0000 mg | ORAL_TABLET | Freq: Every evening | ORAL | 3 refills | Status: DC | PRN

## 2020-03-09 MED ORDER — NAPROXEN 500 MG PO TABS: 500.0000 mg | ORAL_TABLET | Freq: Two times a day (BID) | ORAL | 2 refills | Status: DC | PRN

## 2020-03-09 NOTE — Progress Notes (Signed)
[place]  [place] Phone: [phone] Subjective:   John [name], am serving as a scribe for Dr. Hulan Saas. This visit occurred during the SARS-CoV-2 public health emergency.  Safety protocols were in place, including screening questions prior to the visit, additional usage of staff PPE, and extensive cleaning of exam room while observing appropriate contact time as indicated for disinfecting solutions.   I'm seeing this patient by the request  of:  Vivi Barrack, MD  CC: Neck pain  [id number]   02/23/2020 64 year old gentleman who was found to have more of a cervical radiculopathy.  Questionable also carpal tunnel which patient has not been seen by me in quite some time.  Patient did respond initially to gabapentin but did have some nightmares initially.  Will continue with the 100 mg at this time.  We discussed with him I would rather see patient in the office before we make any other significant changes.  Patient knows if it worsening he needs to call sooner otherwise he is scheduled for November 30 do not feel comfortable doing anything stronger to further evaluate.  Unfortunately patient has any type of exacerbation needs to seek medical care immediately.  Update 03/09/2020 John [name] is a 64 y.o. male coming in with complaint of neck pain. [name] change in his neck pain. Discontinue gabapentin as this did not help to alleviate his pain. In March had lung issue and was given prednisone and he had some leftover. Took 3, 2, 2, 1 which decreased his pain in right arm. Tingling is still present but has lessened.  Patient states that the pain seems to be worse at night.  Patient states anytime he lays down flat on his back or extends his neck on his soft palate seems to be worse.  He can get up to move around or sit in his recliner and gets better.  Still only sleeping about 1 hour at a time.     Past Medical History:    Diagnosis Date  . Afib ([place])   . Anxiety and depression   . Asthma   . Emphysema of lung ([place])   . GERD (gastroesophageal reflux disease)   . Hernia    right inguinal  . History of bronchitis   . Hyperlipidemia   . Hypertension   . Morton's neuroma   . OA (osteoarthritis)    neck  . Obstructive sleep apnea on CPAP   . Pancreatitis   . Torn meniscus    Left   Past Surgical History:  Procedure Laterality Date  . BONE CYST EXCISION  1991   left wrist  . COLONOSCOPY    . HERNIA REPAIR  04/14/11   right inguinal  . HERNIA REPAIR     left inguinal, december 2020   Social History   Socioeconomic History  . Marital status: Divorced    Spouse name: Not on file  . Number of children: Not on file  . Years of education: Not on file  . Highest education level: Not on file  Occupational History  . Not on file  Tobacco Use  . Smoking status: Former Smoker    Packs/day: 1.50    Years: 38.00    Pack years: 57.00    Types: Cigarettes    Quit date: 2015    Years since quitting: 6.9  . Smokeless tobacco: Never Used  Vaping Use  . Vaping Use: Every day  Substance and Sexual Activity  .  Alcohol use: Not Currently  . Drug use: [name]  . Sexual activity: Yes    Partners: Female  Other Topics Concern  . Not on file  Social History Narrative  . Not on file   Social Determinants of Health   Financial Resource Strain:   . Difficulty of Paying Living Expenses: Not on file  Food Insecurity:   . Worried About Charity fundraiser in the Last Year: Not on file  . Ran Out of Food in the Last Year: Not on file  Transportation Needs:   . Lack of Transportation (Medical): Not on file  . Lack of Transportation (Non-Medical): Not on file  Physical Activity:   . Days of Exercise per Week: Not on file  . Minutes of Exercise per Session: Not on file  Stress:   . Feeling of Stress : Not on file  Social Connections:   . Frequency of Communication with Friends and Family: Not on file  .  Frequency of Social Gatherings with Friends and Family: Not on file  . Attends Religious Services: Not on file  . Active Member of Clubs or Organizations: Not on file  . Attends Archivist Meetings: Not on file  . Marital Status: Not on file   [name] Known Allergies Family History  Problem Relation Age of Onset  . Hypertension Mother   . Dementia Other   . Hypertension Sister   . Hypertension Brother      Current Outpatient Medications (Cardiovascular):  .  diltiazem (CARDIZEM) 30 MG tablet, Take one tablet by mouth every 4 hours AS NEEDED for A-fib HR > 100 as long as BP > 100. **NEEDS APPT .  olmesartan (BENICAR) 40 MG tablet, Take 1 tablet (40 mg total) by mouth daily. .  sildenafil (VIAGRA) 100 MG tablet, Take 1 tablet (100 mg total) by mouth daily as needed for erectile dysfunction.  Current Outpatient Medications (Respiratory):  .  albuterol (VENTOLIN HFA) 108 (90 Base) MCG/ACT inhaler, Inhale 2 puffs into the lungs every 6 (six) hours as needed for wheezing or shortness of breath. .  Fluticasone-Umeclidin-Vilant (TRELEGY ELLIPTA) 100-62.5-25 MCG/INH AEPB, Inhale 1 puff into the lungs daily. Marland Kitchen  ipratropium (ATROVENT) 0.06 % nasal spray, USE 2 SPRAYS IN EACH NOSTRIL UP TO THREE TIMES DAILY. .  montelukast (SINGULAIR) 10 MG tablet, TAKE 1 TABLET BY MOUTH EVERY DAY .  Oxymetazoline HCl (AFRIN NASAL SPRAY NA), Place 1 spray into the nose as needed.  Current Outpatient Medications (Analgesics):  .  meloxicam (MOBIC) 15 MG tablet, TAKE 1 TABLET BY MOUTH EVERY DAY .  traMADol (ULTRAM) 50 MG tablet, TAKE 1 TABLET BY MOUTH TWICE A DAY .  naproxen (NAPROSYN) 500 MG tablet, Take 1 tablet (500 mg total) by mouth 2 (two) times daily as needed.  Current Outpatient Medications (Hematological):  .  vitamin B-12 (CYANOCOBALAMIN) 100 MCG tablet, Take 50 mcg by mouth daily.    Current Outpatient Medications (Other):  .  cholecalciferol (VITAMIN D3) 25 MCG (1000 UT) tablet, Take 1,000  Units by mouth 2 (two) times daily. .  clonazePAM (KLONOPIN) 0.5 MG tablet, TAKE 1 TABLET (0.5 MG TOTAL) BY MOUTH AT BEDTIME AS NEEDED FOR ANXIETY. .  diazepam (VALIUM) 2 MG tablet, Take by mouth. .  esomeprazole (NEXIUM) 20 MG capsule, Take 20 mg by mouth daily at 12 noon. .  methocarbamol (ROBAXIN) 500 MG tablet, Take 500 mg by mouth as needed for muscle spasms. .  Multiple Vitamins-Minerals (MULTIVITAMIN WITH MINERALS) tablet, Take 1  tablet by mouth daily.   .  NON FORMULARY, Terbinafine .  Omega-3 Fatty Acids (FISH OIL) 1200 MG CAPS, Take by mouth. .  Plant Sterol Stanol-Pantethine 300-100 MG CAPS, Take by mouth. Marland Kitchen  Specialty Vitamins Products (MAGNESIUM, AMINO ACID CHELATE,) 133 MG tablet, Take 1 tablet by mouth 2 (two) times a week.  .  Turmeric 450 MG CAPS, Take by mouth. .  vitamin E 400 UNIT capsule, Take 400 Units by mouth daily. .  traZODone (DESYREL) 50 MG tablet, Take 0.5-1 tablets (25-50 mg total) by mouth at bedtime as needed for sleep.   Reviewed prior external information including notes and imaging from  primary care provider As well as notes that were available from care everywhere and other healthcare systems.  Past medical history, social, surgical and family history all reviewed in electronic medical record.  [name] pertanent information unless stated regarding to the chief complaint.   Review of Systems:  [name] headache, visual changes, nausea, vomiting, diarrhea, constipation, dizziness, abdominal pain, skin rash, fevers, chills, night sweats, weight loss, swollen lymph nodes, body aches, joint swelling, chest pain, shortness of breath, mood changes. POSITIVE muscle aches  Objective  Blood pressure (!) 132/96, pulse (!) 107, height 6' (1.829 m), weight 206 lb (93.4 kg), SpO2 97 %.   General: [name] apparent distress alert and oriented x3 mood and affect normal, dressed appropriately.  HEENT: Pupils equal, extraocular movements intact  Respiratory: Patient's speak in full  sentences and does not appear short of breath  Cardiovascular: [name] lower extremity edema, non tender, [name] erythema  Neck exam does have some mild loss of lordosis.  Positive Spurling's in the C8 distribution on the right side.  4 out of 5 strength in the C8 distribution on the right side.  Patient does have mild crepitus with range of motion.  Patient does have some very mild signs that is positive for thoracic outlet as well.  Negative Tinel's at the wrist thumb but patient does have mild arthritic changes.    Impression and Recommendations:     The above documentation has been reviewed and is accurate and complete Lyndal Pulley, DO

## 2020-03-09 NOTE — Assessment & Plan Note (Signed)
Patient was seen for more of a COPD flare by someone else and given prednisone.  Patient did respond but has had difficulty with patient with heart rate x-rays ordered today patient is having radicular symptoms mostly in the C8 distribution.  We discussed different treatment options.  Patient was adamant that he would like steroid but I did not feel comfortable giving it to him today.  We will change patient's meloxicam to naproxen and see if he gets some relief.  Patient given some trazodone to help with some sleep time discomfort.  Patient did not feel the gabapentin was making any significant progress.  Patient is to increase activity slowly.  Patient will have an MRI secondary to the weakness and radicular symptoms at this time.  MRI depending could be a candidate for possible epidurals.  Has responded well to them in the back.  Patient will follow up with him after the epidurals.  If continuing to have difficulty we do need to consider possibly looking more at a nerve conduction test

## 2020-03-09 NOTE — Patient Instructions (Addendum)
Xray today Stop meloxicam, start naproxen 500mg  2x a day, watch for stomach pain Trazadone 50mg  at night to help with sleep Exercises 3x a week MRI cervical [phone] Will see if we can do epidural after MRI If we do epidural follow up in 4 weeks after that

## 2020-03-10 ENCOUNTER — Telehealth: Payer: Self-pay | Admitting: Podiatry

## 2020-03-10 NOTE — Telephone Encounter (Signed)
lvm for pt to call regarding the 2nd pr of orthotics.Marland KitchenMarland Kitchen

## 2020-03-12 NOTE — Telephone Encounter (Signed)
Pt returned my call and left a message for me to call back.  I returned call and just wanted to make him aware the cost of the orthotics was 219.00 and that they were in and he could pick them up.

## 2020-03-17 ENCOUNTER — Other Ambulatory Visit: Payer: Self-pay | Admitting: Family Medicine

## 2020-03-22 ENCOUNTER — Encounter: Payer: Self-pay | Admitting: Family Medicine

## 2020-03-22 ENCOUNTER — Ambulatory Visit
Admission: RE | Admit: 2020-03-22 | Discharge: 2020-03-22 | Disposition: A | Discharge status: Home / Self Care | Payer: 59 | Source: Ambulatory Visit | Attending: Family Medicine | Admitting: Family Medicine

## 2020-03-22 DIAGNOSIS — M542 Cervicalgia: Secondary | ICD-10-CM

## 2020-03-25 ENCOUNTER — Encounter: Payer: Self-pay | Admitting: Family Medicine

## 2020-03-25 ENCOUNTER — Other Ambulatory Visit: Payer: Self-pay

## 2020-03-25 ENCOUNTER — Ambulatory Visit (INDEPENDENT_AMBULATORY_CARE_PROVIDER_SITE_OTHER): Payer: 59 | Admitting: Family Medicine

## 2020-03-25 DIAGNOSIS — M5010 Cervical disc disorder with radiculopathy, unspecified cervical region: Secondary | ICD-10-CM

## 2020-03-25 MED ORDER — METHYLPREDNISOLONE 4 MG PO TBPK: ORAL_TABLET | ORAL | 0 refills | Status: DC

## 2020-03-25 NOTE — Progress Notes (Signed)
Virtual Visit via Video Note  I connected with John [name] on 03/25/20 at  4:15 PM EST by a video enabled telemedicine application and verified that I am speaking with the correct person using two identifiers. Patient alone    Location: Patient: Patient is at home Provider: In office setting   I discussed the limitations of evaluation and management by telemedicine and the availability of in person appointments. The patient expressed understanding and agreed to proceed.  History of Present Illness: Patient is a 64 year old gentleman with neck pain with radicular symptoms.  Patient had failed all conservative therapy and did have an MRI.  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  Patient states that the pain is unrelenting at this time.  Things are worsening.  Unable to sleep.  Patient needs something else done.  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  Already given tramadol by primary care provider.    Observations/Objective: Alert and oriented x3, patient does sound uncomfortable unable to see patient secondary to difficulty with the virtual platform.    Assessment and Plan: Patient is a 64 year old gentleman with neck pain with cervical radiculopathy that is consistent with patient's spinal stenosis that has been diagnosed with the MRI.  We discussed with patient in great length and we will try an epidural.  Patient has had multiple epidurals previously in his lumbar spine and understands the risks and benefits.  Unfortunately at this point he is having so much pain and not responding to the naproxen, tramadol, trazodone or muscle relaxer she is tried.  Patient is wanting steroids.  Patient has had 2 episodes of atrial fibrillation previously that seem to be secondary to prednisone.  Patient states though he has been given  methylprednisolone previously.  Reviewed chart and in March he was given this.  We discussed with him about the potential risk.  Patient understands this.  Athletic trainer did hear this as well.  Understands the risk and knows if he has any heart issues he will go immediately.  We will do a half dose of the methylprednisolone for a 5-day burst.  Patient understands this and has done it in the past he states with good success.  Then patient will hopefully have the epidural in the near future and then we would like to see patient back again in 3 to 4 weeks.  Worsening pain patient will need to see neurosurgery.  Patient does do his lawn care and if needed surgical intervention would want to do it in next fall   Follow Up Instructions: 3 to 4 weeks after epidural    I discussed the assessment and treatment plan with the patient. The patient was provided an opportunity to ask questions and all were answered. The patient agreed with the plan and demonstrated an understanding of the instructions.   The patient was advised to call back or seek an in-person evaluation if the symptoms worsen or if the condition fails to improve as anticipated.  I provided 25 minutes of time during this encounter.  We discussed significantly about the steroids, and the epidurals, the MRI in great detail today.   Lyndal Pulley, DO

## 2020-03-31 ENCOUNTER — Other Ambulatory Visit: Payer: Self-pay | Admitting: Family Medicine

## 2020-04-05 ENCOUNTER — Ambulatory Visit: Payer: 59 | Admitting: Podiatry

## 2020-04-05 ENCOUNTER — Other Ambulatory Visit: Payer: Self-pay

## 2020-04-05 DIAGNOSIS — M722 Plantar fascial fibromatosis: Secondary | ICD-10-CM | POA: Diagnosis not present

## 2020-04-05 DIAGNOSIS — B351 Tinea unguium: Secondary | ICD-10-CM | POA: Diagnosis not present

## 2020-04-05 NOTE — Progress Notes (Signed)
Subjective: 64 year old male presents actually for evaluation of toenail fungus in his right big toe.  He has been using the topical ointment through [employer].  He does feel that color has improved.  Still concerned the nail is detached at the distal aspect.  No pain to the nail denies any redness or drainage or any swelling.  The heel pain is resolved.  He still wearing orthotics. Denies any systemic complaints such as fevers, chills, nausea, vomiting. No acute changes since last appointment, and no other complaints at this time.   Objective: AAO x3, NAD DP/PT pulses palpable bilaterally, CRT less than 3 seconds Right hallux toenail is detached the distal one half of the proximal portions attached.  Overall nail color has improved.  There is no edema, erythema and pain to the toenail. No pain on the plantar fascia.  No other area discomfort. No pain with calf compression, swelling, warmth, erythema  Assessment: Onychomycosis, onychodystrophy right hallux; resolved plantar fasciitis  Plan: -All treatment options discussed with the patient including all alternatives, risks, complications.  -We discussed treatment options for the nail.  Discussed nail removal but wishes to hold off on that.  He is to continue the topical ointment. -Continue with supportive shoes, orthotics, stretching exercises for plantar fasciitis. -Patient encouraged to call the office with any questions, concerns, change in symptoms.   Trula Slade DPM

## 2020-04-07 ENCOUNTER — Ambulatory Visit
Admission: RE | Admit: 2020-04-07 | Discharge: 2020-04-07 | Disposition: A | Discharge status: Home / Self Care | Payer: 59 | Source: Ambulatory Visit | Attending: Family Medicine | Admitting: Family Medicine

## 2020-04-07 ENCOUNTER — Encounter: Payer: Self-pay | Admitting: Family Medicine

## 2020-04-07 ENCOUNTER — Other Ambulatory Visit: Payer: Self-pay

## 2020-04-07 DIAGNOSIS — M5010 Cervical disc disorder with radiculopathy, unspecified cervical region: Secondary | ICD-10-CM

## 2020-04-07 MED ORDER — TRIAMCINOLONE ACETONIDE 40 MG/ML IJ SUSP (RADIOLOGY)
60.0000 mg | Freq: Once | INTRAMUSCULAR | Status: AC
  Administered 2020-04-07: 60 mg via EPIDURAL

## 2020-04-07 MED ORDER — IOPAMIDOL (ISOVUE-M 300) INJECTION 61%
1.0000 mL | Freq: Once | INTRAMUSCULAR | Status: AC | PRN
  Administered 2020-04-07: 1 mL via EPIDURAL

## 2020-04-07 NOTE — Discharge Instructions (Signed)

## 2020-04-16 ENCOUNTER — Other Ambulatory Visit: Payer: Self-pay

## 2020-04-16 DIAGNOSIS — M5010 Cervical disc disorder with radiculopathy, unspecified cervical region: Secondary | ICD-10-CM

## 2020-04-26 ENCOUNTER — Telehealth (HOSPITAL_COMMUNITY): Payer: Self-pay | Admitting: *Deleted

## 2020-04-26 NOTE — Telephone Encounter (Signed)
Patient called in stating he needed a refill on Multaq. Medication is not listed on his current medication list and pt has not been seen in nearly 2.5 years. Pt states he takes multaq on an as needed basis when he takes steroids and needs a refill. Verbalized to patient the provider would  Need to see him in office to be able to refill this medication pt became upset on phone call stating he didn't need an appointment just a refill. Again told pt he would need to be seen by provider for refill of medication. Pt adamantly refused appointment stating "I'll probably end up in the ER" and hung up the phone.

## 2020-04-28 ENCOUNTER — Other Ambulatory Visit: Payer: Self-pay | Admitting: Family Medicine

## 2020-04-29 ENCOUNTER — Encounter (HOSPITAL_COMMUNITY): Payer: Self-pay | Admitting: Nurse Practitioner

## 2020-04-29 ENCOUNTER — Ambulatory Visit (HOSPITAL_COMMUNITY)
Admission: RE | Admit: 2020-04-29 | Discharge: 2020-04-29 | Disposition: A | Discharge status: Home / Self Care | Payer: 59 | Source: Ambulatory Visit | Attending: Nurse Practitioner | Admitting: Nurse Practitioner

## 2020-04-29 ENCOUNTER — Other Ambulatory Visit: Payer: Self-pay

## 2020-04-29 VITALS — BP 139/97 | HR 98 | Ht 72.0 in | Wt 205.8 lb

## 2020-04-29 DIAGNOSIS — M5489 Other dorsalgia: Secondary | ICD-10-CM | POA: Diagnosis not present

## 2020-04-29 DIAGNOSIS — I10 Essential (primary) hypertension: Secondary | ICD-10-CM | POA: Insufficient documentation

## 2020-04-29 DIAGNOSIS — Z79899 Other long term (current) drug therapy: Secondary | ICD-10-CM | POA: Insufficient documentation

## 2020-04-29 DIAGNOSIS — Z791 Long term (current) use of non-steroidal anti-inflammatories (NSAID): Secondary | ICD-10-CM | POA: Diagnosis not present

## 2020-04-29 DIAGNOSIS — F32A Depression, unspecified: Secondary | ICD-10-CM | POA: Diagnosis not present

## 2020-04-29 DIAGNOSIS — Z87891 Personal history of nicotine dependence: Secondary | ICD-10-CM | POA: Diagnosis not present

## 2020-04-29 DIAGNOSIS — Z7951 Long term (current) use of inhaled steroids: Secondary | ICD-10-CM | POA: Insufficient documentation

## 2020-04-29 DIAGNOSIS — G4733 Obstructive sleep apnea (adult) (pediatric): Secondary | ICD-10-CM | POA: Diagnosis not present

## 2020-04-29 DIAGNOSIS — G8929 Other chronic pain: Secondary | ICD-10-CM | POA: Diagnosis not present

## 2020-04-29 DIAGNOSIS — Z79891 Long term (current) use of opiate analgesic: Secondary | ICD-10-CM | POA: Insufficient documentation

## 2020-04-29 DIAGNOSIS — F411 Generalized anxiety disorder: Secondary | ICD-10-CM | POA: Insufficient documentation

## 2020-04-29 DIAGNOSIS — I480 Paroxysmal atrial fibrillation: Secondary | ICD-10-CM | POA: Diagnosis not present

## 2020-04-29 MED ORDER — MULTAQ 400 MG PO TABS: 400.0000 mg | ORAL_TABLET | Freq: Two times a day (BID) | ORAL | 3 refills | Status: DC | PRN

## 2020-04-29 NOTE — Progress Notes (Signed)
Primary Care Physician: Vivi Barrack, MD Referring Physician: [facility]   John [name] is a 65 y.o. male with a h/o anxiety and depression, HTN, obstructive sleep apnea, with new onset afib treated in [facility] ER 8/15, with RVR at 177 bpm. He had two doses of adenosine in EMS which did not change rhythm.   He did have successful cardioversion. He was drinking a lot of caffeine drinks,before ER visit, vapes with low nicotine option, is "addicted to Aftrin nose spray" and because of this may get some systemic effects from decongestants, has OSA but does not use cpap as he felt worsened his nasal congestion.  He works in yard care, owns his own business.  He has chronic back issues with herniated disc in 2014. He has to take meloxicam 15 mg daily as well as ultram daily to function in his physical job.He is concerned that he is now on eliquis 5 mg bid per cardioversion protocol. He has a chadsvasc score of 1,HTN, so he will not need anticoagulation long term by guidelines.   F/u in afib clinic, 08/20/17. Unfortunately, pt had return of afib with rvr. He required cardioversion.  He has had a flare of acute on chronic back pain and had taken a steroid taper, just finishing a couple of days before he had afib. He had gotten off daily Afrin  nose spray, but had uses around 3 weeks with the recent pollen issues and working outside. Marland Kitchen He was suppose to have a back injection tomorrow but will have to cancel. He was not started on anticoagulation after the cardioversion but by protocol will require anticoagulation x 4 weeks. He is aware that he will have to put off the back injection. He is very anxious re afib, " I never want to have another episode."  Struggled with anxiety for years.  F/u in afib clinic, 5/20. Pt was started on multaq 400 mg bid. He has not had any further afib. He feels well on Multaq.EKG does not show any qtc prolongation.  F/u with afib clinic, 6/24. He is staying in SR with Multaq and is  tolerating without any diarrhea or nausea.  F/u in afib clinic, 11/4. He is feeling well. He is staying in rhythm with Multaq. BP is elevated on presentation but states white coat hypertension.BP 's well managed per pt.   F/u in afib clinic, 04/30/19. I have not seen pt since 2019. Apparently  he was doing well and stopped the Multaq. He wishes to have a refill of Multaq as he uses it with prednisone  tapers which he usually has to have once a year for his lungs.He had afib past one of the tapers before and feels this is a trigger for him.  He recently had to have a steroid injection for his shoulder and finished the Multaq he had on hand, which is also outdayed. No afib burden to speak of. By his description, it sounds as thought he has PAC's at times   Today, he denies symptoms of palpitations, chest pain, shortness of breath, orthopnea, PND, lower extremity edema, dizziness, presyncope, syncope, or neurologic sequela. The patient is tolerating medications without difficulties and is otherwise without complaint today.   Past Medical History:  Diagnosis Date  . Afib ([place])   . Anxiety and depression   . Asthma   . Emphysema of lung ([place])   . GERD (gastroesophageal reflux disease)   . Hernia    right inguinal  . History of  bronchitis   . Hyperlipidemia   . Hypertension   . Morton's neuroma   . OA (osteoarthritis)    neck  . Obstructive sleep apnea on CPAP   . Pancreatitis   . Torn meniscus    Left   Past Surgical History:  Procedure Laterality Date  . BONE CYST EXCISION  1991   left wrist  . COLONOSCOPY    . HERNIA REPAIR  04/14/11   right inguinal  . HERNIA REPAIR     left inguinal, december 2020    Current Outpatient Medications  Medication Sig Dispense Refill  . cholecalciferol (VITAMIN D3) 25 MCG (1000 UT) tablet Take 1,000 Units by mouth 2 (two) times daily.    . clonazePAM (KLONOPIN) 0.5 MG tablet TAKE 1 TABLET (0.5 MG TOTAL) BY MOUTH AT BEDTIME AS NEEDED FOR ANXIETY. 30  tablet 5  . diltiazem (CARDIZEM) 30 MG tablet Take one tablet by mouth every 4 hours AS NEEDED for A-fib HR > 100 as long as BP > 100. **NEEDS APPT 30 tablet 0  . dronedarone (MULTAQ) 400 MG tablet Take 1 tablet (400 mg total) by mouth 2 (two) times daily as needed (breakthrough afib). 30 tablet 3  . esomeprazole (NEXIUM) 20 MG capsule Take 20 mg by mouth daily at 12 noon.    . Fluticasone-Umeclidin-Vilant (TRELEGY ELLIPTA) 100-62.5-25 MCG/INH AEPB Inhale 1 puff into the lungs daily. 180 each 2  . ipratropium (ATROVENT) 0.06 % nasal spray USE 2 SPRAYS IN EACH NOSTRIL UP TO THREE TIMES DAILY. 15 mL 3  . methocarbamol (ROBAXIN) 500 MG tablet Take 500 mg by mouth as needed for muscle spasms.    . montelukast (SINGULAIR) 10 MG tablet TAKE 1 TABLET BY MOUTH EVERY DAY 90 tablet 1  . Multiple Vitamins-Minerals (MULTIVITAMIN WITH MINERALS) tablet Take 1 tablet by mouth daily.    . naproxen (NAPROSYN) 500 MG tablet Take 1 tablet (500 mg total) by mouth 2 (two) times daily as needed. 60 tablet 2  . NON FORMULARY Terbinafine    . olmesartan (BENICAR) 40 MG tablet Take 1 tablet (40 mg total) by mouth daily. 90 tablet 3  . Omega-3 Fatty Acids (FISH OIL) 1200 MG CAPS Take by mouth.    . Oxymetazoline HCl (AFRIN NASAL SPRAY NA) Place 1 spray into the nose as needed.    . Plant Sterol Stanol-Pantethine 300-100 MG CAPS Take by mouth.    . sildenafil (VIAGRA) 100 MG tablet Take 1 tablet (100 mg total) by mouth daily as needed for erectile dysfunction. 20 tablet 4  . Specialty Vitamins Products (MAGNESIUM, AMINO ACID CHELATE,) 133 MG tablet Take 1 tablet by mouth 2 (two) times a week.    . Turmeric 450 MG CAPS Take by mouth.    . vitamin B-12 (CYANOCOBALAMIN) 100 MCG tablet Take 50 mcg by mouth daily.    . vitamin E 400 UNIT capsule Take 400 Units by mouth daily.    . traMADol (ULTRAM) 50 MG tablet TAKE 1 TABLET BY MOUTH TWICE A DAY 60 tablet 0   No current facility-administered medications for this encounter.     No Known Allergies  Social History   Socioeconomic History  . Marital status: Divorced    Spouse name: Not on file  . Number of children: Not on file  . Years of education: Not on file  . Highest education level: Not on file  Occupational History  . Not on file  Tobacco Use  . Smoking status: Former Smoker  Packs/day: 1.50    Years: 38.00    Pack years: 57.00    Types: Cigarettes    Quit date: 2015    Years since quitting: 7.0  . Smokeless tobacco: Never Used  Vaping Use  . Vaping Use: Every day  Substance and Sexual Activity  . Alcohol use: Not Currently  . Drug use: No  . Sexual activity: Yes    Partners: Female  Other Topics Concern  . Not on file  Social History Narrative  . Not on file   Social Determinants of Health   Financial Resource Strain: Not on file  Food Insecurity: Not on file  Transportation Needs: Not on file  Physical Activity: Not on file  Stress: Not on file  Social Connections: Not on file  Intimate Partner Violence: Not on file    Family History  Problem Relation Age of Onset  . Hypertension Mother   . Dementia Other   . Hypertension Sister   . Hypertension Brother     ROS- All systems are reviewed and negative except as per the HPI above  Physical Exam: Vitals:   04/29/20 0915  BP: (!) 139/97  Pulse: 98  Weight: 93.4 kg  Height: 6' (1.829 m)   Wt Readings from Last 3 Encounters:  04/29/20 93.4 kg  03/09/20 93.4 kg  10/27/19 90.4 kg    Labs: Lab Results  Component Value Date   NA 138 05/02/2019   K 4.6 05/02/2019   CL 104 05/02/2019   CO2 25 05/02/2019   GLUCOSE 94 05/02/2019   BUN 24 05/02/2019   CREATININE 1.02 05/02/2019   CALCIUM 9.8 05/02/2019   MG 1.9 04/22/2018   No results found for: INR Lab Results  Component Value Date   CHOL 232 (H) 05/02/2019   HDL 38 (L) 05/02/2019   LDLCALC 165 (H) 05/02/2019   TRIG 145 05/02/2019     GEN- The patient is well appearing, alert and oriented x 3 today.    Head- normocephalic, atraumatic Eyes-  Sclera clear, conjunctiva pink Ears- hearing intact Oropharynx- clear Neck- supple, no JVP Lymph- no cervical lymphadenopathy Lungs- Clear to ausculation bilaterally, normal work of breathing Heart- Regular rate and rhythm, no murmurs, rubs or gallops, PMI not laterally displaced GI- soft, NT, ND, + BS Extremities- no clubbing, cyanosis, or edema MS- no significant deformity or atrophy Skin- no rash or lesion Psych- euthymic mood, full affect Neuro- strength and sensation are intact  EKG- NSR at 86 bpm, PR int 136 ms, qrs int 82 ms, qtc 440 ms Epic records reviewed Echo-Study Conclusions  - Left ventricle: The cavity size was normal. Wall thickness was   increased in a pattern of mild LVH. Systolic function was normal.   The estimated ejection fraction was in the range of 55% to 60%.   Wall motion was normal; there were no regional wall motion   abnormalities. Doppler parameters are consistent with abnormal   left ventricular relaxation (grade 1 diastolic dysfunction). - Aortic valve: Trileaflet; mildly thickened, mildly calcified   leaflets. - Left atrium: The atrium was normal in size.    Assessment and Plan: 1. Paroxysmal afib with RVR Staying in SR  Was on Multaq daily for many months Stopped later for  low afib burden  Wishes to use Multaq prn when he has to take prn steroids as he had afib once time in the past associated with this   Rx for  Multaq 400 mg bid when needed  He tolerated Multaq  with heart rate and qt interval when on this daily  before  2. Chadsvasc score of 1 By guidelines, he does not need to be on anticoagulation at this point   3. Lifestyle issues contributing to afib He was encouraged to use CPAP  4. HTN Stable   F/u in one year  John [name], [facility] [address] [place], [place] [address] [phone]

## 2020-04-30 ENCOUNTER — Other Ambulatory Visit (HOSPITAL_COMMUNITY): Payer: Self-pay | Admitting: *Deleted

## 2020-04-30 MED ORDER — DILTIAZEM HCL 30 MG PO TABS: ORAL_TABLET | ORAL | 2 refills | Status: DC

## 2020-05-06 ENCOUNTER — Encounter: Payer: Self-pay | Admitting: Family Medicine

## 2020-05-06 ENCOUNTER — Other Ambulatory Visit: Payer: Self-pay

## 2020-05-06 ENCOUNTER — Ambulatory Visit (INDEPENDENT_AMBULATORY_CARE_PROVIDER_SITE_OTHER): Payer: 59 | Admitting: Family Medicine

## 2020-05-06 VITALS — BP 167/78 | HR 93 | Temp 98.7°F | Ht 72.0 in | Wt 206.6 lb

## 2020-05-06 DIAGNOSIS — Z0001 Encounter for general adult medical examination with abnormal findings: Secondary | ICD-10-CM

## 2020-05-06 DIAGNOSIS — I10 Essential (primary) hypertension: Secondary | ICD-10-CM | POA: Diagnosis not present

## 2020-05-06 DIAGNOSIS — R739 Hyperglycemia, unspecified: Secondary | ICD-10-CM

## 2020-05-06 DIAGNOSIS — Z125 Encounter for screening for malignant neoplasm of prostate: Secondary | ICD-10-CM

## 2020-05-06 DIAGNOSIS — E785 Hyperlipidemia, unspecified: Secondary | ICD-10-CM | POA: Diagnosis not present

## 2020-05-06 DIAGNOSIS — E663 Overweight: Secondary | ICD-10-CM

## 2020-05-06 DIAGNOSIS — R5383 Other fatigue: Secondary | ICD-10-CM | POA: Diagnosis not present

## 2020-05-06 DIAGNOSIS — J309 Allergic rhinitis, unspecified: Secondary | ICD-10-CM

## 2020-05-06 DIAGNOSIS — Z6828 Body mass index (BMI) 28.0-28.9, adult: Secondary | ICD-10-CM

## 2020-05-06 DIAGNOSIS — M5010 Cervical disc disorder with radiculopathy, unspecified cervical region: Secondary | ICD-10-CM

## 2020-05-06 LAB — VITAMIN B12: Vitamin B-12: >1526 pg/mL — ABNORMAL HIGH (ref 211–911)

## 2020-05-06 LAB — COMPREHENSIVE METABOLIC PANEL
ALT: 24 U/L (ref 0–53)
AST: 21 U/L (ref 0–37)
Albumin: 4.2 g/dL (ref 3.5–5.2)
Alkaline Phosphatase: 65 U/L (ref 39–117)
BUN: 21 mg/dL (ref 6–23)
CO2: 31 mEq/L (ref 19–32)
Calcium: 9.7 mg/dL (ref 8.4–10.5)
Chloride: 102 mEq/L (ref 96–112)
Creatinine, Ser: 1.19 mg/dL (ref 0.40–1.50)
GFR: 65.26 mL/min (ref >60.00)
Glucose, Bld: 101 mg/dL — ABNORMAL HIGH (ref 70–99)
Potassium: 4.8 mEq/L (ref 3.5–5.1)
Sodium: 138 mEq/L (ref 135–145)
Total Bilirubin: 0.4 mg/dL (ref 0.2–1.2)
Total Protein: 6.8 g/dL (ref 6.0–8.3)

## 2020-05-06 LAB — PSA: PSA: 0.84 ng/mL (ref 0.10–4.00)

## 2020-05-06 LAB — LIPID PANEL
Cholesterol: 211 mg/dL — ABNORMAL HIGH (ref 0–200)
HDL: 40.9 mg/dL (ref >39.00)
LDL Cholesterol: 150 mg/dL — ABNORMAL HIGH (ref 0–99)
NonHDL: 170.22
Total CHOL/HDL Ratio: 5
Triglycerides: 102 mg/dL (ref 0.0–149.0)
VLDL: 20.4 mg/dL (ref 0.0–40.0)

## 2020-05-06 LAB — TESTOSTERONE: Testosterone: 187.96 ng/dL — ABNORMAL LOW (ref 300.00–890.00)

## 2020-05-06 LAB — CBC
HCT: 41.5 % (ref 39.0–52.0)
Hemoglobin: 13.8 g/dL (ref 13.0–17.0)
MCHC: 33.3 g/dL (ref 30.0–36.0)
MCV: 90.7 fl (ref 78.0–100.0)
Platelets: 210 10*3/uL (ref 150.0–400.0)
RBC: 4.57 Mil/uL (ref 4.22–5.81)
RDW: 13.8 % (ref 11.5–15.5)
WBC: 4.9 10*3/uL (ref 4.0–10.5)

## 2020-05-06 LAB — VITAMIN D 25 HYDROXY (VIT D DEFICIENCY, FRACTURES): VITD: 61.32 ng/mL (ref 30.00–100.00)

## 2020-05-06 LAB — TSH: TSH: 1.38 u[IU]/mL (ref 0.35–4.50)

## 2020-05-06 LAB — HEMOGLOBIN A1C: Hgb A1c MFr Bld: 5.7 % (ref 4.6–6.5)

## 2020-05-06 MED ORDER — SILDENAFIL CITRATE 100 MG PO TABS
100.0000 mg | ORAL_TABLET | Freq: Every day | ORAL | 4 refills | Status: DC | PRN
Start: 2020-05-06 — End: 2021-05-16

## 2020-05-06 MED ORDER — TIZANIDINE HCL 4 MG PO TABS: 4.0000 mg | ORAL_TABLET | Freq: Four times a day (QID) | ORAL | 0 refills | Status: DC | PRN

## 2020-05-06 MED ORDER — CELECOXIB 100 MG PO CAPS: 200.0000 mg | ORAL_CAPSULE | Freq: Every day | ORAL | 1 refills | Status: DC

## 2020-05-06 NOTE — Assessment & Plan Note (Signed)
Had upset stomach with naproxen.  We will switch to Celebrex.

## 2020-05-06 NOTE — Assessment & Plan Note (Signed)
Check A1c. 

## 2020-05-06 NOTE — Assessment & Plan Note (Signed)
Check lipids.  Continue lifestyle modifications. 

## 2020-05-06 NOTE — Patient Instructions (Signed)
It was very nice to see you today!  Please try the Celebrex and Zanaflex.  We will refill your Viagra today as well.  We will check blood work today.  I will see you back in year.  Please come back to see me sooner if needed.  Take care, Dr [name]  Please try these tips to maintain a healthy lifestyle:   Eat at least 3 REAL meals and 1-2 snacks per day.  Aim for no more than 5 hours between eating.  If you eat breakfast, please do so within one hour of getting up.    Each meal should contain half fruits/vegetables, one quarter protein, and one quarter carbs (no bigger than a computer mouse)   Cut down on sweet beverages. This includes juice, soda, and sweet tea.     Drink at least 1 glass of water with each meal and aim for at least 8 glasses per day   Exercise at least 150 minutes every week.    Preventive Care 75-58 Years Old, Male Preventive care refers to lifestyle choices and visits with your health care provider that can promote health and wellness. This includes:  A yearly physical exam. This is also called an annual wellness visit.  Regular dental and eye exams.  Immunizations.  Screening for certain conditions.  Healthy lifestyle choices, such as: ? Eating a healthy diet. ? Getting regular exercise. ? Not using drugs or products that contain nicotine and tobacco. ? Limiting alcohol use. What can I expect for my preventive care visit? Physical exam Your health care provider will check your:  Height and weight. These may be used to calculate your BMI (body mass index). BMI is a measurement that tells if you are at a healthy weight.  Heart rate and blood pressure.  Body temperature.  Skin for abnormal spots. Counseling Your health care provider may ask you questions about your:  Past medical problems.  Family's medical history.  Alcohol, tobacco, and drug use.  Emotional well-being.  Home life and relationship well-being.  Sexual  activity.  Diet, exercise, and sleep habits.  Work and work Statistician.  Access to firearms. What immunizations do I need? Vaccines are usually given at various ages, according to a schedule. Your health care provider will recommend vaccines for you based on your age, medical history, and lifestyle or other factors, such as travel or where you work.   What tests do I need? Blood tests  Lipid and cholesterol levels. These may be checked every 5 years, or more often if you are over 33 years old.  Hepatitis C test.  Hepatitis B test. Screening  Lung cancer screening. You may have this screening every year starting at age 80 if you have a 30-pack-year history of smoking and currently smoke or have quit within the past 15 years.  Prostate cancer screening. Recommendations will vary depending on your family history and other risks.  Genital exam to check for testicular cancer or hernias.  Colorectal cancer screening. ? All adults should have this screening starting at age 86 and continuing until age 66. ? Your health care provider may recommend screening at age 88 if you are at increased risk. ? You will have tests every 1-10 years, depending on your results and the type of screening test.  Diabetes screening. ? This is done by checking your blood sugar (glucose) after you have not eaten for a while (fasting). ? You may have this done every 1-3 years.  STD (sexually transmitted  disease) testing, if you are at risk. Follow these instructions at home: Eating and drinking  Eat a diet that includes fresh fruits and vegetables, whole grains, lean protein, and low-fat dairy products.  Take vitamin and mineral supplements as recommended by your health care provider.  Do not drink alcohol if your health care provider tells you not to drink.  If you drink alcohol: ? Limit how much you have to 0-2 drinks a day. ? Be aware of how much alcohol is in your drink. In the [place] one drink  equals one 12 oz bottle of beer (355 mL), one 5 oz glass of wine (148 mL), or one 1 oz glass of hard liquor (44 mL).   Lifestyle  Take daily care of your teeth and gums. Brush your teeth every morning and night with fluoride toothpaste. Floss one time each day.  Stay active. Exercise for at least 30 minutes 5 or more days each week.  Do not use any products that contain nicotine or tobacco, such as cigarettes, e-cigarettes, and chewing tobacco. If you need help quitting, ask your health care provider.  Do not use drugs.  If you are sexually active, practice safe sex. Use a condom or other form of protection to prevent STIs (sexually transmitted infections).  If told by your health care provider, take low-dose aspirin daily starting at age 14.  Find healthy ways to cope with stress, such as: ? Meditation, yoga, or listening to music. ? Journaling. ? Talking to a trusted person. ? Spending time with friends and family. Safety  Always wear your seat belt while driving or riding in a vehicle.  Do not drive: ? If you have been drinking alcohol. Do not ride with someone who has been drinking. ? When you are tired or distracted. ? While texting.  Wear a helmet and other protective equipment during sports activities.  If you have firearms in your house, make sure you follow all gun safety procedures. What's next?  Go to your health care provider once a year for an annual wellness visit.  Ask your health care provider how often you should have your eyes and teeth checked.  Stay up to date on all vaccines. This information is not intended to replace advice given to you by your health care provider. Make sure you discuss any questions you have with your health care provider. Document Revised: 12/24/2018 Document Reviewed: 03/21/2018 Elsevier Patient Education  2021 [employer].

## 2020-05-06 NOTE — Assessment & Plan Note (Signed)
Above goal though typically well controlled on olmesartan 40mg  daily. Check labs today.

## 2020-05-06 NOTE — Progress Notes (Signed)
Chief Complaint:  John [name] is a 65 y.o. male who presents today for his annual comprehensive physical exam.    Assessment/Plan:  New/Acute Problems: Fatigue Check CBC, CMET, TSH, B12, vitamin D, and testosterone.  Cramping Possibly side effect of corticosteroids.  Symptoms seem to be improving.  Will check labs today.  Also send in Zanaflex to use as needed.  Chronic Problems Addressed Today: Hyperglycemia Check A1c.   Dyslipidemia Check lipids. Continue lifestyle modifications.   Allergic rhinitis Stable on flonase.   Essential hypertension Above goal though typically well controlled on olmesartan 40mg  daily. Check labs today.   Cervical disc disorder with radiculopathy of cervical region Had upset stomach with naproxen.  We will switch to Celebrex.   Body mass index is 28.02 kg/m. / Overweight  BMI Metric Follow Up - 05/06/20 1059      BMI Metric Follow Up-Please document annually   BMI Metric Follow Up Education provided            Preventative Healthcare: Check labs. UTD on colon cancer screening.   Patient Counseling(The following topics were reviewed and/or handout was given):  -Nutrition: Stressed importance of moderation in sodium/caffeine intake, saturated fat and cholesterol, caloric balance, sufficient intake of fresh fruits, vegetables, and fiber.  -Stressed the importance of regular exercise.   -Substance Abuse: Discussed cessation/primary prevention of tobacco, alcohol, or other drug use; driving or other dangerous activities under the influence; availability of treatment for abuse.   -Injury prevention: Discussed safety belts, safety helmets, smoke detector, smoking near bedding or upholstery.   -Sexuality: Discussed sexually transmitted diseases, partner selection, use of condoms, avoidance of unintended pregnancy and contraceptive alternatives.   -Dental health: Discussed importance of regular tooth brushing, flossing, and dental visits.   -Health maintenance and immunizations reviewed. Please refer to Health maintenance section.  Return to care in 1 year for next preventative visit.     Subjective:  HPI:  He has no acute complaints today.  He has recently received steroid injection.  Has some cramping afterwards.  Cramping issues have mostly resolved.  Lifestyle Diet: Balanced.  Exercise: None specific.   Depression screen [facility] 2/9 05/06/2020  Decreased Interest 0  Down, Depressed, Hopeless 0  PHQ - 2 Score 0   ROS: Per HPI, otherwise a complete review of systems was negative.   PMH:  The following were reviewed and entered/updated in epic: Past Medical History:  Diagnosis Date  . Afib ([place])   . Anxiety and depression   . Asthma   . Emphysema of lung ([place])   . GERD (gastroesophageal reflux disease)   . Hernia    right inguinal  . History of bronchitis   . Hyperlipidemia   . Hypertension   . Morton's neuroma   . OA (osteoarthritis)    neck  . Obstructive sleep apnea on CPAP   . Pancreatitis   . Torn meniscus    Left   Patient Active Problem List   Diagnosis Date Noted  . Cervical disc disorder with radiculopathy of cervical region 03/09/2020  . Neck pain on left side 02/23/2020  . Left trigger finger 08/26/2018  . Dyslipidemia 04/23/2018  . Hyperglycemia 04/23/2018  . Essential hypertension 03/04/2018  . Chronic low back pain with sciatica 03/04/2018  . COPD (chronic obstructive pulmonary disease) ([place]) 03/04/2018  . Allergic rhinitis 03/04/2018  . Anxiety 03/04/2018  . Erectile dysfunction 03/04/2018  . GERD (gastroesophageal reflux disease) 03/04/2018  . PAF (paroxysmal atrial fibrillation) ([place]) 03/28/2017  Past Surgical History:  Procedure Laterality Date  . BONE CYST EXCISION  1991   left wrist  . COLONOSCOPY    . HERNIA REPAIR  04/14/11   right inguinal  . HERNIA REPAIR     left inguinal, december 2020    Family History  Problem Relation Age of Onset  . Hypertension Mother    . Dementia Other   . Hypertension Sister   . Hypertension Brother     Medications- reviewed and updated Current Outpatient Medications  Medication Sig Dispense Refill  . celecoxib (CELEBREX) 100 MG capsule Take 2 capsules (200 mg total) by mouth daily. 60 capsule 1  . cholecalciferol (VITAMIN D3) 25 MCG (1000 UT) tablet Take 1,000 Units by mouth 2 (two) times daily.    . clonazePAM (KLONOPIN) 0.5 MG tablet TAKE 1 TABLET (0.5 MG TOTAL) BY MOUTH AT BEDTIME AS NEEDED FOR ANXIETY. 30 tablet 5  . diltiazem (CARDIZEM) 30 MG tablet Take one tablet by mouth every 4 hours AS NEEDED for A-fib HR > 100 as long as BP > 100 30 tablet 2  . dronedarone (MULTAQ) 400 MG tablet Take 1 tablet (400 mg total) by mouth 2 (two) times daily as needed (breakthrough afib). 30 tablet 3  . esomeprazole (NEXIUM) 20 MG capsule Take 20 mg by mouth daily at 12 noon.    . Fluticasone-Umeclidin-Vilant (TRELEGY ELLIPTA) 100-62.5-25 MCG/INH AEPB Inhale 1 puff into the lungs daily. 180 each 2  . ipratropium (ATROVENT) 0.06 % nasal spray USE 2 SPRAYS IN EACH NOSTRIL UP TO THREE TIMES DAILY. 15 mL 3  . montelukast (SINGULAIR) 10 MG tablet TAKE 1 TABLET BY MOUTH EVERY DAY 90 tablet 1  . Multiple Vitamins-Minerals (MULTIVITAMIN WITH MINERALS) tablet Take 1 tablet by mouth daily.    . NON FORMULARY Terbinafine    . olmesartan (BENICAR) 40 MG tablet Take 1 tablet (40 mg total) by mouth daily. 90 tablet 3  . Omega-3 Fatty Acids (FISH OIL) 1200 MG CAPS Take by mouth.    . peppermint oil liquid by Does not apply route as needed.    Marland Kitchen Specialty Vitamins Products (MAGNESIUM, AMINO ACID CHELATE,) 133 MG tablet Take 1 tablet by mouth 2 (two) times a week.    Marland Kitchen tiZANidine (ZANAFLEX) 4 MG tablet Take 1 tablet (4 mg total) by mouth every 6 (six) hours as needed for muscle spasms. 30 tablet 0  . traMADol (ULTRAM) 50 MG tablet TAKE 1 TABLET BY MOUTH TWICE A DAY 60 tablet 0  . Turmeric 450 MG CAPS Take by mouth.    . vitamin B-12  (CYANOCOBALAMIN) 100 MCG tablet Take 50 mcg by mouth daily.    . vitamin E 400 UNIT capsule Take 400 Units by mouth daily.    . sildenafil (VIAGRA) 100 MG tablet Take 1 tablet (100 mg total) by mouth daily as needed for erectile dysfunction. 20 tablet 4   No current facility-administered medications for this visit.    Allergies-reviewed and updated No Known Allergies  Social History   Socioeconomic History  . Marital status: Divorced    Spouse name: Not on file  . Number of children: Not on file  . Years of education: Not on file  . Highest education level: Not on file  Occupational History  . Not on file  Tobacco Use  . Smoking status: Former Smoker    Packs/day: 1.50    Years: 38.00    Pack years: 57.00    Types: Cigarettes    Quit date:  2015    Years since quitting: 7.0  . Smokeless tobacco: Never Used  Vaping Use  . Vaping Use: Every day  Substance and Sexual Activity  . Alcohol use: Not Currently  . Drug use: No  . Sexual activity: Yes    Partners: Female  Other Topics Concern  . Not on file  Social History Narrative  . Not on file   Social Determinants of Health   Financial Resource Strain: Not on file  Food Insecurity: Not on file  Transportation Needs: Not on file  Physical Activity: Not on file  Stress: Not on file  Social Connections: Not on file        Objective:  Physical Exam: BP (!) 167/78   Pulse 93   Temp 98.7 F (37.1 C) (Temporal)   Ht 6' (1.829 m)   Wt 206 lb 9.6 oz (93.7 kg)   SpO2 98%   BMI 28.02 kg/m   Body mass index is 28.02 kg/m. Wt Readings from Last 3 Encounters:  05/06/20 206 lb 9.6 oz (93.7 kg)  04/29/20 205 lb 12.8 oz (93.4 kg)  03/09/20 206 lb (93.4 kg)   Gen: NAD, resting comfortably HEENT: TMs normal bilaterally. OP clear. No thyromegaly noted.  CV: RRR with no murmurs appreciated Pulm: NWOB, CTAB with no crackles, wheezes, or rhonchi GI: Normal bowel sounds present. Soft, Nontender, Nondistended. MSK: no  edema, cyanosis, or clubbing noted Skin: warm, dry Neuro: CN2-12 grossly intact. Strength 5/5 in upper and lower extremities. Reflexes symmetric and intact bilaterally.  Psych: Normal affect and thought content     Caleb M. [name], MD 05/06/2020 11:01 AM

## 2020-05-06 NOTE — Assessment & Plan Note (Signed)
Stable on flonase.

## 2020-05-07 ENCOUNTER — Encounter: Payer: Self-pay | Admitting: Family Medicine

## 2020-05-07 NOTE — Progress Notes (Signed)
Please inform patient of the following:  Cholesterol is better than last year but still above goal.  Blood sugar is borderline but stable.   His testosterone is low again. Would like for him to schedule appointment to discuss risks/benefits of replacement therapy and various forms of replacement if he is interested.  We can recheck everything else in a year.  John [name]. [name], MD 05/07/2020 4:11 PM

## 2020-05-10 ENCOUNTER — Telehealth (HOSPITAL_COMMUNITY): Payer: Self-pay | Admitting: *Deleted

## 2020-05-10 NOTE — Telephone Encounter (Signed)
medimpact approved preauth for Multaq through 05/06/2021  auth reference number [id number]

## 2020-05-11 ENCOUNTER — Encounter: Payer: Self-pay | Admitting: Family Medicine

## 2020-05-13 ENCOUNTER — Telehealth (INDEPENDENT_AMBULATORY_CARE_PROVIDER_SITE_OTHER): Payer: 59 | Admitting: Family Medicine

## 2020-05-13 ENCOUNTER — Telehealth: Payer: Self-pay | Admitting: *Deleted

## 2020-05-13 ENCOUNTER — Encounter: Payer: Self-pay | Admitting: Family Medicine

## 2020-05-13 VITALS — Ht 72.0 in | Wt 206.0 lb

## 2020-05-13 DIAGNOSIS — R7989 Other specified abnormal findings of blood chemistry: Secondary | ICD-10-CM | POA: Diagnosis not present

## 2020-05-13 DIAGNOSIS — R059 Cough, unspecified: Secondary | ICD-10-CM | POA: Diagnosis not present

## 2020-05-13 MED ORDER — TESTOSTERONE 40.5 MG/2.5GM (1.62%) TD GEL: 40.5000 mg | Freq: Every day | TRANSDERMAL | 0 refills | Status: DC

## 2020-05-13 NOTE — Progress Notes (Signed)
   John [name] is a 65 y.o. male who presents today for a telephone visit.  Assessment/Plan:  Chronic Problems Addressed Today: Low testosterone Had lengthy discussion with patient regarding risk and benefits of testosterone replacement in different forms of testosterone placement.  Discussed potential risks including cardiovascular events, prostate cancer, and VTE.  Discussed with patient that he is actually amenable if we keep his range is within goal.  We will start AndroGel.  He will come back in a couple of months to recheck CBC, CMET, and testosterone levels.  Cough Check Covid antibody next blood draw.    Subjective:  HPI:  See A/p.         Objective/Observations   NAD  Telephone Visit   I connected with John [name] on 05/13/20 at 11:40 AM EST via telephone and verified that I am speaking with the correct person using two identifiers. I discussed the limitations of evaluation and management by telemedicine and the availability of in person appointments. The patient expressed understanding and agreed to proceed.   Patient location: Home Provider location: [place] participating in the virtual visit: Myself and Patient  A total of 21 minutes were spent on medical discussion.      John [name]. [name], MD 05/13/2020 12:33 PM

## 2020-05-13 NOTE — Telephone Encounter (Signed)
Approvedtoday The request has been approved. The authorization is effective for a maximum of 12 fills from 05/13/2020 to 05/12/2021, as long as the member is enrolled in their current health plan. A written notification letter will follow with additional details. Pharmacy notified

## 2020-05-13 NOTE — Telephone Encounter (Signed)
Drug Testosterone 40.5 MG/2.5GM(1.62%) gel Form MedImpact ePA Form 2017 NCPDP  PA send today, awaiting for determination

## 2020-05-13 NOTE — Assessment & Plan Note (Signed)
Had lengthy discussion with patient regarding risk and benefits of testosterone replacement in different forms of testosterone placement.  Discussed potential risks including cardiovascular events, prostate cancer, and VTE.  Discussed with patient that he is actually amenable if we keep his range is within goal.  We will start AndroGel.  He will come back in a couple of months to recheck CBC, CMET, and testosterone levels.

## 2020-05-18 ENCOUNTER — Other Ambulatory Visit (HOSPITAL_COMMUNITY): Payer: Self-pay | Admitting: Nurse Practitioner

## 2020-05-18 ENCOUNTER — Encounter: Payer: Self-pay | Admitting: Family Medicine

## 2020-05-20 ENCOUNTER — Other Ambulatory Visit: Payer: Self-pay

## 2020-05-20 DIAGNOSIS — R7989 Other specified abnormal findings of blood chemistry: Secondary | ICD-10-CM

## 2020-06-01 ENCOUNTER — Other Ambulatory Visit: Payer: Self-pay | Admitting: Family Medicine

## 2020-06-04 ENCOUNTER — Encounter: Payer: Self-pay | Admitting: Family Medicine

## 2020-06-07 ENCOUNTER — Other Ambulatory Visit: Payer: Self-pay

## 2020-06-07 DIAGNOSIS — M5412 Radiculopathy, cervical region: Secondary | ICD-10-CM

## 2020-06-10 ENCOUNTER — Other Ambulatory Visit: Payer: Self-pay | Admitting: Family Medicine

## 2020-06-10 NOTE — Telephone Encounter (Signed)
LAST APPOINTMENT DATE: 05/06/2020   NEXT APPOINTMENT DATE: 08/09/2020    LAST REFILL: 05/13/2020

## 2020-06-11 NOTE — Telephone Encounter (Signed)
.   LAST APPOINTMENT DATE: 06/01/2020   NEXT APPOINTMENT DATE:@5 /05/2020  MEDICATION:Testosterone 40.5 MG/2.5GM (1.62%) GEL   PHARMACY:[id number] - [place], [place] - [address]  Let patient know to contact pharmacy at the end of the day to make sure medication is ready.  Please notify patient to allow 48-72 hours to process  Encourage patient to contact the pharmacy for refills or they can request refills through [place]:   LAST REFILL:  QTY:  REFILL DATE:    OTHER COMMENTS:    Okay for refill?  Please advise

## 2020-06-12 ENCOUNTER — Other Ambulatory Visit: Payer: Self-pay | Admitting: Family Medicine

## 2020-06-14 ENCOUNTER — Telehealth: Payer: Self-pay

## 2020-06-14 NOTE — Telephone Encounter (Signed)
Pt is calling back in requesting to talk to a supervisor. He says it took days for Dr. [name] to approve his refill request. He went to the pharmacy today after waiting for ' days' and they are out of the medication so it will be another 2 days. Pt states he will have missed 3 doses of his medication now. Pt states this is absolutely ridiculous and why in the he** does he take medication if he cant get it. I told him our supervisor was on lunch and would call him back

## 2020-06-14 NOTE — Telephone Encounter (Signed)
Pt called in requesting to speak to a supervisor or someone in charge of the office- that he has an issue he needs to address. I told Pt our supervisor wasn't here yet, but I'd send a message and they would call him back. Pt stated " Yeah I wont hold my breath, but ok"

## 2020-06-15 ENCOUNTER — Ambulatory Visit
Admission: RE | Admit: 2020-06-15 | Discharge: 2020-06-15 | Disposition: A | Discharge status: Home / Self Care | Payer: 59 | Source: Ambulatory Visit | Attending: Family Medicine | Admitting: Family Medicine

## 2020-06-15 ENCOUNTER — Other Ambulatory Visit: Payer: Self-pay

## 2020-06-15 ENCOUNTER — Encounter: Payer: Self-pay | Admitting: Family Medicine

## 2020-06-15 DIAGNOSIS — M5412 Radiculopathy, cervical region: Secondary | ICD-10-CM

## 2020-06-15 MED ORDER — IOPAMIDOL (ISOVUE-M 300) INJECTION 61%
1.0000 mL | Freq: Once | INTRAMUSCULAR | Status: AC | PRN
  Administered 2020-06-15: 1 mL via EPIDURAL

## 2020-06-15 MED ORDER — TRIAMCINOLONE ACETONIDE 40 MG/ML IJ SUSP (RADIOLOGY)
60.0000 mg | Freq: Once | INTRAMUSCULAR | Status: AC
  Administered 2020-06-15: 60 mg via EPIDURAL

## 2020-06-15 NOTE — Discharge Instructions (Signed)

## 2020-06-17 ENCOUNTER — Telehealth: Payer: Self-pay | Admitting: Family Medicine

## 2020-06-17 NOTE — Telephone Encounter (Signed)
Patient is requesting a transfer of care to Dr. Scarlette Calico. Please advise

## 2020-06-17 NOTE — Telephone Encounter (Signed)
Yes, I will see him.

## 2020-06-18 NOTE — Telephone Encounter (Signed)
[place] with me.   Algis Greenhouse. [name], MD 06/18/2020 3:55 PM

## 2020-06-22 NOTE — Telephone Encounter (Signed)
Called pt, LVM to call office to set up TOC appt.

## 2020-06-29 ENCOUNTER — Other Ambulatory Visit: Payer: Self-pay | Admitting: Family Medicine

## 2020-06-29 NOTE — Telephone Encounter (Signed)
Left patient message to schedule with Dr. Tamala Julian or request refill from Dr. [name].

## 2020-06-30 ENCOUNTER — Other Ambulatory Visit: Payer: Self-pay | Admitting: Family Medicine

## 2020-07-05 ENCOUNTER — Encounter: Payer: Self-pay | Admitting: Family Medicine

## 2020-07-05 NOTE — Telephone Encounter (Signed)
Please advise 

## 2020-07-06 NOTE — Telephone Encounter (Signed)
See note, can we schedule him

## 2020-07-06 NOTE — Telephone Encounter (Signed)
lvm for pt to get scheduled

## 2020-07-07 NOTE — Telephone Encounter (Signed)
Patient has been scheduled for 07/09/19. Patient wanted to wait since he has to work today.

## 2020-07-08 ENCOUNTER — Other Ambulatory Visit: Payer: Self-pay

## 2020-07-08 ENCOUNTER — Telehealth (INDEPENDENT_AMBULATORY_CARE_PROVIDER_SITE_OTHER): Payer: 59 | Admitting: Family Medicine

## 2020-07-08 ENCOUNTER — Encounter: Payer: Self-pay | Admitting: Family Medicine

## 2020-07-08 DIAGNOSIS — R509 Fever, unspecified: Secondary | ICD-10-CM

## 2020-07-08 DIAGNOSIS — N529 Male erectile dysfunction, unspecified: Secondary | ICD-10-CM | POA: Diagnosis not present

## 2020-07-08 DIAGNOSIS — R059 Cough, unspecified: Secondary | ICD-10-CM | POA: Diagnosis not present

## 2020-07-08 DIAGNOSIS — R7989 Other specified abnormal findings of blood chemistry: Secondary | ICD-10-CM

## 2020-07-08 NOTE — Addendum Note (Signed)
Addended by: Doran Clay A on: 07/08/2020 02:10 PM   Modules accepted: Orders

## 2020-07-08 NOTE — Assessment & Plan Note (Signed)
Has been on AndroGel for the past several weeks.  We will recheck testosterone level and adjust dose as needed.  He has not noticed much benefit from testosterone supplementation as of yet.

## 2020-07-08 NOTE — Addendum Note (Signed)
Addended by: Doran Clay A on: 07/08/2020 02:09 PM   Modules accepted: Orders

## 2020-07-08 NOTE — Assessment & Plan Note (Signed)
Worsened since starting testosterone.  We will check testosterone levels as above.  He can continue Viagra as needed.

## 2020-07-08 NOTE — Progress Notes (Signed)
   John [name] is a 65 y.o. male who presents today for a virtual office visit.  Assessment/Plan:  New/Acute Problems: Fever No clear localizing source however reassuring that symptoms have resolved.  Possibly could have been mild episode of sinusitis.  We will continue with watchful waiting for now.  If symptoms return we will need to check labs x-ray and urine culture.  Chronic Problems Addressed Today: Low testosterone Has been on AndroGel for the past several weeks.  We will recheck testosterone level and adjust dose as needed.  He has not noticed much benefit from testosterone supplementation as of yet.  Erectile dysfunction Worsened since starting testosterone.  We will check testosterone levels as above.  He can continue Viagra as needed.     Subjective:  HPI:  Patient with fever started 12 days ago.  Lasted until about a day and a half ago.  Temperatures 100.4.  Had little to fatigue may be some rhinorrhea.  No other obvious symptoms.  No dysuria.  No abdominal pain.  Took on Covid test which was negative.  No known sick contacts.  Has not had any recurrence of symptoms over the last day or so.  Would also like to discuss testosterone today.  We started testosterone supplementation about 6 weeks ago.  Since then he has had noticed worsening libido and erectile dysfunction.  He has been compliant with medication.  Has not noticed any other side effects.        Objective/Observations  Physical Exam: Gen: NAD, resting comfortably Pulm: Normal work of breathing Neuro: Grossly normal, moves all extremities Psych: Normal affect and thought content  Virtual Visit via Video   I connected with John [name] on 07/08/20 at 11:40 AM EDT by a video enabled telemedicine application and verified that I am speaking with the correct person using two identifiers. The limitations of evaluation and management by telemedicine and the availability of in person appointments were discussed. The  patient expressed understanding and agreed to proceed.   Patient location: Home Provider location: [place] participating in the virtual visit: Myself and Patient     Algis Greenhouse. [name], MD 07/08/2020 12:32 PM

## 2020-07-09 LAB — CBC
HCT: 40 % (ref 39.0–52.0)
Hemoglobin: 13.3 g/dL (ref 13.0–17.0)
MCHC: 33.2 g/dL (ref 30.0–36.0)
MCV: 91 fl (ref 78.0–100.0)
Platelets: 261 10*3/uL (ref 150.0–400.0)
RBC: 4.4 Mil/uL (ref 4.22–5.81)
RDW: 13.2 % (ref 11.5–15.5)
WBC: 7.1 10*3/uL (ref 4.0–10.5)

## 2020-07-09 LAB — COMPREHENSIVE METABOLIC PANEL
ALT: 19 U/L (ref 0–53)
AST: 16 U/L (ref 0–37)
Albumin: 3.9 g/dL (ref 3.5–5.2)
Alkaline Phosphatase: 105 U/L (ref 39–117)
BUN: 20 mg/dL (ref 6–23)
CO2: 27 mEq/L (ref 19–32)
Calcium: 9.3 mg/dL (ref 8.4–10.5)
Chloride: 103 mEq/L (ref 96–112)
Creatinine, Ser: 1.1 mg/dL (ref 0.40–1.50)
GFR: 71.63 mL/min (ref >60.00)
Glucose, Bld: 108 mg/dL — ABNORMAL HIGH (ref 70–99)
Potassium: 4.5 mEq/L (ref 3.5–5.1)
Sodium: 140 mEq/L (ref 135–145)
Total Bilirubin: 0.3 mg/dL (ref 0.2–1.2)
Total Protein: 6.9 g/dL (ref 6.0–8.3)

## 2020-07-09 LAB — TESTOSTERONE: Testosterone: 705.81 ng/dL (ref 300.00–890.00)

## 2020-07-09 LAB — SARS-COV-2 IGG: SARS-COV-2 IgG: 0.03

## 2020-07-09 NOTE — Progress Notes (Signed)
Please inform patient of the following:  His testosterone levels are back to within the normal limits. Do not have a clear explanation as to why the testosterone replacement is not helping with his ED and libido. Recommend urology referral if he is interested or we can give it another few weeks to see if symptoms improve as his body adjusts to the testosterone supplementation.  John [name]. [name], MD 07/09/2020 3:18 PM

## 2020-08-09 ENCOUNTER — Ambulatory Visit: Payer: 59 | Admitting: Family Medicine

## 2020-08-09 ENCOUNTER — Ambulatory Visit (INDEPENDENT_AMBULATORY_CARE_PROVIDER_SITE_OTHER): Payer: 59 | Admitting: Internal Medicine

## 2020-08-09 ENCOUNTER — Encounter: Payer: Self-pay | Admitting: Internal Medicine

## 2020-08-09 ENCOUNTER — Other Ambulatory Visit: Payer: Self-pay

## 2020-08-09 VITALS — BP 148/86 | HR 80 | Temp 98.5°F | Resp 16 | Ht 72.0 in | Wt 207.0 lb

## 2020-08-09 DIAGNOSIS — E785 Hyperlipidemia, unspecified: Secondary | ICD-10-CM | POA: Insufficient documentation

## 2020-08-09 DIAGNOSIS — E291 Testicular hypofunction: Secondary | ICD-10-CM

## 2020-08-09 DIAGNOSIS — I10 Essential (primary) hypertension: Secondary | ICD-10-CM

## 2020-08-09 MED ORDER — ROSUVASTATIN CALCIUM 10 MG PO TABS
10.0000 mg | ORAL_TABLET | Freq: Every day | ORAL | 1 refills | Status: DC
Start: 2020-08-09 — End: 2020-10-04

## 2020-08-09 NOTE — Progress Notes (Signed)
Subjective:  Patient ID: John [name], male    DOB: 18-Sep-1955  Age: 65 y.o. MRN: [id number]  CC: Hypertension and Hyperlipidemia  This visit occurred during the SARS-CoV-2 public health emergency.  Safety protocols were in place, including screening questions prior to the visit, additional usage of staff PPE, and extensive cleaning of exam room while observing appropriate contact time as indicated for disinfecting solutions.    HPI John [name] presents for f/up -   He tells me his blood pressure at home is well controlled with systolics in the 500-938 range and diastolics in the 18-29 range.  He tells me he is compliant with the ARB.  He is active and denies any recent episodes of chest pain, shortness of breath, palpitations, edema, or fatigue.  He has a history of paroxysmal atrial fibrillation and has decided not to be anticoagulated.  He has a history of hypogonadism and would like to check his testosterone level.  He was not willing to get vaccinated against pneumonia or tetanus today.  Outpatient Medications Prior to Visit  Medication Sig Dispense Refill  . celecoxib (CELEBREX) 100 MG capsule Take 2 capsules (200 mg total) by mouth daily. 60 capsule 1  . cholecalciferol (VITAMIN D3) 25 MCG (1000 UT) tablet Take 1,000 Units by mouth 2 (two) times daily.    . clonazePAM (KLONOPIN) 0.5 MG tablet TAKE 1 TABLET (0.5 MG TOTAL) BY MOUTH AT BEDTIME AS NEEDED FOR ANXIETY. 30 tablet 5  . diltiazem (CARDIZEM) 30 MG tablet Take one tablet by mouth every 4 hours AS NEEDED for A-fib HR > 100 as long as BP > 100 30 tablet 2  . esomeprazole (NEXIUM) 20 MG capsule Take 20 mg by mouth daily at 12 noon.    . Fluticasone-Umeclidin-Vilant (TRELEGY ELLIPTA) 100-62.5-25 MCG/INH AEPB Inhale 1 puff into the lungs daily. 180 each 2  . ipratropium (ATROVENT) 0.06 % nasal spray USE 2 SPRAYS IN EACH NOSTRIL UP TO THREE TIMES DAILY. 15 mL 3  . montelukast (SINGULAIR) 10 MG tablet TAKE 1 TABLET BY MOUTH EVERY DAY 90  tablet 1  . MULTAQ 400 MG tablet TAKE 1 TABLET BY MOUTH 2 TIMES DAILY AS NEEDED (BREAKTHROUGH AFIB) 60 tablet 11  . Multiple Vitamins-Minerals (MULTIVITAMIN WITH MINERALS) tablet Take 1 tablet by mouth daily.    . naproxen (NAPROSYN) 500 MG tablet TAKE 1 TABLET BY MOUTH TWICE A DAY AS NEEDED 60 tablet 1  . NON FORMULARY Terbinafine    . olmesartan (BENICAR) 40 MG tablet TAKE 1 TABLET BY MOUTH EVERY DAY 90 tablet 3  . Omega-3 Fatty Acids (FISH OIL) 1200 MG CAPS Take by mouth.    . peppermint oil liquid by Does not apply route as needed.    . sildenafil (VIAGRA) 100 MG tablet Take 1 tablet (100 mg total) by mouth daily as needed for erectile dysfunction. 20 tablet 4  . Specialty Vitamins Products (MAGNESIUM, AMINO ACID CHELATE,) 133 MG tablet Take 1 tablet by mouth 2 (two) times a week.    . Testosterone 40.5 MG/2.5GM (1.62%) GEL APPLY ONE PACKET ONTO THE SKIN DAILY. 75 packet 5  . tiZANidine (ZANAFLEX) 4 MG tablet Take 1 tablet (4 mg total) by mouth every 6 (six) hours as needed for muscle spasms. 30 tablet 0  . traMADol (ULTRAM) 50 MG tablet TAKE 1 TABLET BY MOUTH TWICE A DAY 60 tablet 0  . Turmeric 450 MG CAPS Take by mouth.    . vitamin B-12 (CYANOCOBALAMIN) 100 MCG tablet Take  50 mcg by mouth daily.    . vitamin E 400 UNIT capsule Take 400 Units by mouth daily.     No facility-administered medications prior to visit.    ROS Review of Systems  Constitutional: Negative for appetite change, diaphoresis, fatigue and unexpected weight change.  HENT: Negative.   Eyes: Negative for visual disturbance.  Respiratory: Negative for cough, chest tightness, shortness of breath and wheezing.   Cardiovascular: Negative for chest pain, palpitations and leg swelling.  Gastrointestinal: Negative for abdominal pain, constipation, diarrhea, nausea and vomiting.  Endocrine: Negative.   Genitourinary: Negative.  Negative for difficulty urinating.  Musculoskeletal: Negative for arthralgias and myalgias.   Skin: Negative.  Negative for color change and pallor.  Neurological: Negative.  Negative for dizziness, weakness and light-headedness.  Hematological: Negative for adenopathy. Does not bruise/bleed easily.  Psychiatric/Behavioral: Negative.     Objective:  BP (!) 148/86 (BP Location: Left Arm, Patient Position: Sitting, Cuff Size: Large)   Pulse 80   Temp 98.5 F (36.9 C) (Oral)   Resp 16   Ht 6' (1.829 m)   Wt 207 lb (93.9 kg)   SpO2 95%   BMI 28.07 kg/m   BP Readings from Last 3 Encounters:  08/09/20 (!) 148/86  06/15/20 (!) 141/99  05/06/20 (!) 167/78    Wt Readings from Last 3 Encounters:  08/09/20 207 lb (93.9 kg)  05/13/20 206 lb (93.4 kg)  05/06/20 206 lb 9.6 oz (93.7 kg)    Physical Exam Vitals reviewed.  HENT:     Nose: Nose normal.     Mouth/Throat:     Mouth: Mucous membranes are moist.  Eyes:     General: No scleral icterus.    Conjunctiva/sclera: Conjunctivae normal.  Cardiovascular:     Rate and Rhythm: Normal rate and regular rhythm.     Heart sounds: No murmur heard.   Pulmonary:     Effort: Pulmonary effort is normal.     Breath sounds: No stridor. No wheezing, rhonchi or rales.  Abdominal:     General: Abdomen is flat. Bowel sounds are normal. There is no distension.     Palpations: Abdomen is soft. There is no hepatomegaly, splenomegaly or mass.     Tenderness: There is no abdominal tenderness.  Musculoskeletal:        General: Normal range of motion.     Cervical back: Neck supple.     Right lower leg: No edema.     Left lower leg: No edema.  Lymphadenopathy:     Cervical: No cervical adenopathy.  Skin:    General: Skin is warm and dry.  Neurological:     General: No focal deficit present.     Mental Status: He is alert.     Lab Results  Component Value Date   WBC 7.1 07/08/2020   HGB 13.3 07/08/2020   HCT 40.0 07/08/2020   PLT 261.0 07/08/2020   GLUCOSE 108 (H) 07/08/2020   CHOL 211 (H) 05/06/2020   TRIG 102.0  05/06/2020   HDL 40.90 05/06/2020   LDLCALC 150 (H) 05/06/2020   ALT 19 07/08/2020   AST 16 07/08/2020   NA 140 07/08/2020   K 4.5 07/08/2020   CL 103 07/08/2020   CREATININE 1.10 07/08/2020   BUN 20 07/08/2020   CO2 27 07/08/2020   TSH 1.38 05/06/2020   PSA 0.84 05/06/2020   HGBA1C 5.7 05/06/2020    DG INJECT DIAG/THERA/INC NEEDLE/CATH/PLC EPI/CERV/THOR W/IMG  Result Date: 06/15/2020 CLINICAL DATA:  Cervical  spondylosis without myelopathy. The patient reports complete relief following the epidural injection on 04/07/2020 with pain returning after heavy labor in February. There is pain in the right arm as well as new numbness in the left arm. FLUOROSCOPY TIME:  Fluoroscopy Time: 20 seconds Radiation Exposure Index: 8.52 microGray*m^2 PROCEDURE: The procedure, risks, benefits, and alternatives were explained to the patient. Questions regarding the procedure were encouraged and answered. The patient understands and consents to the procedure. CERVICAL EPIDURAL INJECTION An interlaminar approach was performed on the right at C7-T1. A 3.5 inch 20 gauge epidural needle was advanced using loss-of-resistance technique. DIAGNOSTIC EPIDURAL INJECTION Injection of Isovue-M 300 shows a good epidural pattern with spread above and below the level of needle placement, bilaterally but greater on the right. No vascular opacification is seen. THERAPEUTIC EPIDURAL INJECTION 1.5 ml of Kenalog 40 mixed with 2 ml of normal saline were then instilled. The procedure was well-tolerated, and the patient was discharged thirty minutes following the injection in good condition. IMPRESSION: Technically successful interlaminar epidural injection on the right at C7-T1. Electronically Signed   By: Logan Bores M.D.   On: 06/15/2020 11:25    Assessment & Plan:   John [name] was seen today for hypertension and hyperlipidemia.  Diagnoses and all orders for this visit:  Essential hypertension- His blood pressure is not adequately  well controlled but he tells me this is the whitecoat phenomenon.  Will continue the current dose of the ARB and he will continue working on his lifestyle modifications.  Hypogonadism, male- His testosterone level is a tad too high but he is doing well on the current dose. -     Testosterone Total,Free,Bio, Males; Future -     Testosterone Total,Free,Bio, Males  Hyperlipidemia LDL goal <130- He has an elevated ASCVD risk score so I have asked him to take a statin for CV risk reduction. -     rosuvastatin (CRESTOR) 10 MG tablet; Take 1 tablet (10 mg total) by mouth daily.  Dyslipidemia   I am having John [name]. Condron "John [name]" start on rosuvastatin. I am also having him maintain his multivitamin with minerals, vitamin B-12, magnesium (amino acid chelate), Fish Oil, cholecalciferol, Turmeric, vitamin E, esomeprazole, ipratropium, Trelegy Ellipta, NON FORMULARY, clonazePAM, diltiazem, peppermint oil, celecoxib, sildenafil, tiZANidine, Multaq, olmesartan, Testosterone, montelukast, naproxen, and traMADol.  Meds ordered this encounter  Medications  . rosuvastatin (CRESTOR) 10 MG tablet    Sig: Take 1 tablet (10 mg total) by mouth daily.    Dispense:  90 tablet    Refill:  1     Follow-up: Return in about 6 months (around 02/09/2021).  Scarlette Calico, MD

## 2020-08-09 NOTE — Patient Instructions (Signed)

## 2020-08-10 ENCOUNTER — Encounter: Payer: Self-pay | Admitting: Internal Medicine

## 2020-08-10 LAB — TESTOSTERONE TOTAL,FREE,BIO, MALES
Albumin: 4.2 g/dL (ref 3.6–5.1)
Sex Hormone Binding: 36 nmol/L (ref 22–77)
Testosterone, Bioavailable: 228.6 ng/dL (ref 110.0–575.0)
Testosterone, Free: 118.7 pg/mL (ref 46.0–224.0)
Testosterone: 834 ng/dL — ABNORMAL HIGH (ref 250–827)

## 2020-08-20 ENCOUNTER — Telehealth: Payer: Self-pay | Admitting: Family Medicine

## 2020-08-20 NOTE — Telephone Encounter (Signed)
Pt needs another epidural for his neck ordered to [facility], wants done asap. Last done 06/15/2020. Please notify patient via Mychart.

## 2020-08-23 ENCOUNTER — Other Ambulatory Visit: Payer: Self-pay

## 2020-08-23 DIAGNOSIS — M5010 Cervical disc disorder with radiculopathy, unspecified cervical region: Secondary | ICD-10-CM

## 2020-08-23 NOTE — Telephone Encounter (Signed)
Yes ok to order  

## 2020-08-23 NOTE — Telephone Encounter (Signed)
Ordered and patient sent MyChart message.

## 2020-08-27 ENCOUNTER — Telehealth: Payer: Self-pay | Admitting: Family Medicine

## 2020-08-27 NOTE — Telephone Encounter (Signed)
Anderson Malta from [facility] called and we received a fax from [facility] denying patient cervical epidural based on "no documentation of failed conservative treatment"  Anderson Malta was confused as pt had previous approved epidurals in December and March. Unsure if we wanted to move forward with peer to peer.

## 2020-08-30 NOTE — Telephone Encounter (Signed)
I will look to see if I can do an appeal to get it covered if that does not go through then I will do a peer to peer

## 2020-08-31 ENCOUNTER — Other Ambulatory Visit: Payer: Self-pay | Admitting: Internal Medicine

## 2020-08-31 ENCOUNTER — Telehealth: Payer: Self-pay | Admitting: Internal Medicine

## 2020-08-31 ENCOUNTER — Other Ambulatory Visit: Payer: Self-pay | Admitting: *Deleted

## 2020-08-31 DIAGNOSIS — M5010 Cervical disc disorder with radiculopathy, unspecified cervical region: Secondary | ICD-10-CM

## 2020-08-31 DIAGNOSIS — G8929 Other chronic pain: Secondary | ICD-10-CM

## 2020-08-31 MED ORDER — TRELEGY ELLIPTA 100-62.5-25 MCG/INH IN AEPB: 1.0000 | INHALATION_SPRAY | Freq: Every day | RESPIRATORY_TRACT | 0 refills | Status: DC

## 2020-08-31 MED ORDER — METHYLPREDNISOLONE 4 MG PO TBPK: ORAL_TABLET | ORAL | 0 refills | Status: DC

## 2020-08-31 MED ORDER — TRAMADOL HCL 50 MG PO TABS: 50.0000 mg | ORAL_TABLET | Freq: Two times a day (BID) | ORAL | 3 refills | Status: DC

## 2020-08-31 NOTE — Telephone Encounter (Signed)
Letter of appeal sent with clinical documentation and previous epidural results to help get this approved

## 2020-08-31 NOTE — Telephone Encounter (Signed)
Patient called asking if someone could call him to discuss this further.

## 2020-08-31 NOTE — Telephone Encounter (Signed)
    Patient requesting refill for traMADol (ULTRAM) 50 MG tablet Pharmacy [id number] - [place], [place] - [address]. AT [place] [name]

## 2020-09-22 NOTE — Telephone Encounter (Signed)
Patient called back in response to his mychart message. Would someone be able to call him after Tamala Julian is able to advise?

## 2020-09-23 ENCOUNTER — Other Ambulatory Visit: Payer: Self-pay | Admitting: Family Medicine

## 2020-09-23 MED ORDER — METHYLPREDNISOLONE 4 MG PO TBPK: ORAL_TABLET | ORAL | 0 refills | Status: DC

## 2020-09-30 ENCOUNTER — Other Ambulatory Visit: Payer: Self-pay | Admitting: Family Medicine

## 2020-10-04 ENCOUNTER — Other Ambulatory Visit: Payer: Self-pay | Admitting: Internal Medicine

## 2020-10-04 ENCOUNTER — Other Ambulatory Visit: Payer: Self-pay

## 2020-10-04 ENCOUNTER — Encounter: Payer: Self-pay | Admitting: Pulmonary Disease

## 2020-10-04 ENCOUNTER — Telehealth: Payer: Self-pay | Admitting: Internal Medicine

## 2020-10-04 ENCOUNTER — Ambulatory Visit (INDEPENDENT_AMBULATORY_CARE_PROVIDER_SITE_OTHER): Payer: 59 | Admitting: Pulmonary Disease

## 2020-10-04 VITALS — BP 128/92 | HR 98 | Temp 98.3°F | Ht 71.5 in | Wt 213.5 lb

## 2020-10-04 DIAGNOSIS — U070 Vaping-related disorder: Secondary | ICD-10-CM | POA: Diagnosis not present

## 2020-10-04 DIAGNOSIS — J4489 Other specified chronic obstructive pulmonary disease: Secondary | ICD-10-CM

## 2020-10-04 DIAGNOSIS — Z720 Tobacco use: Secondary | ICD-10-CM

## 2020-10-04 DIAGNOSIS — J449 Chronic obstructive pulmonary disease, unspecified: Secondary | ICD-10-CM

## 2020-10-04 DIAGNOSIS — J310 Chronic rhinitis: Secondary | ICD-10-CM

## 2020-10-04 DIAGNOSIS — Z87891 Personal history of nicotine dependence: Secondary | ICD-10-CM

## 2020-10-04 DIAGNOSIS — J309 Allergic rhinitis, unspecified: Secondary | ICD-10-CM

## 2020-10-04 DIAGNOSIS — T485X5A Adverse effect of other anti-common-cold drugs, initial encounter: Secondary | ICD-10-CM

## 2020-10-04 MED ORDER — IPRATROPIUM BROMIDE 0.06 % NA SOLN: 2.0000 | Freq: Three times a day (TID) | NASAL | 1 refills | Status: DC

## 2020-10-04 NOTE — Telephone Encounter (Signed)
    Patient requesting refill for ipratropium (ATROVENT) 0.06 % nasal spray   Pharmacy [id number] - [place], [place] - [address]. AT [address]

## 2020-10-04 NOTE — Progress Notes (Signed)
Synopsis: Referred in May 2021 self-referral for COPD, PCP: By Janith Lima, MD  Subjective:   PATIENT ID: John [name] GENDER: male DOB: October 30, 1955, MRN: [id number]  Chief Complaint  Patient presents with   Follow-up    Here for follow up with Asthma/COPD.  He stated that he has been doing very good with the trelegy.       This is a 65 year old gentleman past medical history of emphysema, asthma, atrial fibrillation, hypertension hyperlipidemia presents to pulmonary clinic for evaluation of COPD.  Patient is a former smoker.  He has not had any prior PFTs.  Previous echocardiogram in 2018 with normal ejection fraction 55 to 15%, grade 1 diastolic dysfunction.  OV 08/18/2019: back in march had URI, lingering cough for 6 weeks. Former smoker 38 years, quit 2015, replaced with vaping. He is still vaping at this point.  Overall he is improved since his exacerbation in March.  He has had previous exacerbations treated with steroids.  He does have previous events in which steroids were given to him he developed atrial fibrillation following this.  He is currently on Multaq.  He is not on anticoagulation.  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  He uses this multiple times a day.  When asked about how much he uses he was unwilling to disclose the exact amount of Afrin nasal spray he uses.  Does complain of fluid and pressure buildup in his ear.  OV 10/04/2020: Patient was referred for lung cancer screening last office visit.  He met with him and declined.  Seems to be very anxious about having or undergoing a screening program.  He is still vaping however.  He vapes several cartridges a day.  Been unable to quit at this time.  He is also not interested in quitting.  He did quit smoking tobacco cigarettes in 2015.  He was able to stop the use of Afrin.  His nasal symptoms and ear issues have resolved.   Past Medical History:  Diagnosis Date   Afib ([place])    Anxiety and depression     Asthma    Emphysema of lung (HCC)    GERD (gastroesophageal reflux disease)    Hernia    right inguinal   History of bronchitis    Hyperlipidemia    Hypertension    Morton's neuroma    OA (osteoarthritis)    neck   Obstructive sleep apnea on CPAP    Pancreatitis    Torn meniscus    Left     Family History  Problem Relation Age of Onset   Hypertension Mother    Dementia Other    Hypertension Sister    Hypertension Brother      Past Surgical History:  Procedure Laterality Date   BONE CYST EXCISION  1991   left wrist   COLONOSCOPY     HERNIA REPAIR  04/14/11   right inguinal   HERNIA REPAIR     left inguinal, december 2020    Social History   Socioeconomic History   Marital status: Divorced    Spouse name: Not on file   Number of children: Not on file   Years of education: Not on file   Highest education level: Not on file  Occupational History   Not on file  Tobacco Use   Smoking status: Former    Packs/day: 1.50    Years: 38.00    Pack years: 57.00    Types: Cigarettes  Quit date: 2015    Years since quitting: 7.4   Smokeless tobacco: Never  Vaping Use   Vaping Use: Every day  Substance and Sexual Activity   Alcohol use: Not Currently   Drug use: No   Sexual activity: Yes    Partners: Female  Other Topics Concern   Not on file  Social History Narrative   Not on file   Social Determinants of Health   Financial Resource Strain: Not on file  Food Insecurity: Not on file  Transportation Needs: Not on file  Physical Activity: Not on file  Stress: Not on file  Social Connections: Not on file  Intimate Partner Violence: Not on file     No Known Allergies   Outpatient Medications Prior to Visit  Medication Sig Dispense Refill   celecoxib (CELEBREX) 100 MG capsule Take 2 capsules (200 mg total) by mouth daily. 60 capsule 1   cholecalciferol (VITAMIN D3) 25 MCG (1000 UT) tablet Take 1,000 Units by mouth 2 (two) times daily.     clonazePAM  (KLONOPIN) 0.5 MG tablet TAKE 1 TABLET (0.5 MG TOTAL) BY MOUTH AT BEDTIME AS NEEDED FOR ANXIETY. 30 tablet 5   diltiazem (CARDIZEM) 30 MG tablet Take one tablet by mouth every 4 hours AS NEEDED for A-fib HR > 100 as long as BP > 100 30 tablet 2   esomeprazole (NEXIUM) 20 MG capsule Take 20 mg by mouth daily at 12 noon.     Fluticasone-Umeclidin-Vilant (TRELEGY ELLIPTA) 100-62.5-25 MCG/INH AEPB Inhale 1 puff into the lungs daily. 180 each 0   ipratropium (ATROVENT) 0.06 % nasal spray USE 2 SPRAYS IN EACH NOSTRIL UP TO THREE TIMES DAILY. 15 mL 3   methylPREDNISolone (MEDROL DOSEPAK) 4 MG TBPK tablet Take 4 pills day 1, 3 pills day 2, 2 pills day 3, 1 pill day 4, 1/2 tab day 5 21 tablet 0   montelukast (SINGULAIR) 10 MG tablet TAKE 1 TABLET BY MOUTH EVERY DAY 90 tablet 1   MULTAQ 400 MG tablet TAKE 1 TABLET BY MOUTH 2 TIMES DAILY AS NEEDED (BREAKTHROUGH AFIB) 60 tablet 11   Multiple Vitamins-Minerals (MULTIVITAMIN WITH MINERALS) tablet Take 1 tablet by mouth daily.     naproxen (NAPROSYN) 500 MG tablet TAKE 1 TABLET BY MOUTH TWICE A DAY AS NEEDED 60 tablet 1   NON FORMULARY Terbinafine     olmesartan (BENICAR) 40 MG tablet TAKE 1 TABLET BY MOUTH EVERY DAY 90 tablet 3   Omega-3 Fatty Acids (FISH OIL) 1200 MG CAPS Take by mouth.     peppermint oil liquid by Does not apply route as needed.     sildenafil (VIAGRA) 100 MG tablet Take 1 tablet (100 mg total) by mouth daily as needed for erectile dysfunction. 20 tablet 4   Specialty Vitamins Products (MAGNESIUM, AMINO ACID CHELATE,) 133 MG tablet Take 1 tablet by mouth 2 (two) times a week.     Testosterone 40.5 MG/2.5GM (1.62%) GEL APPLY ONE PACKET ONTO THE SKIN DAILY. 75 packet 5   tiZANidine (ZANAFLEX) 4 MG tablet Take 1 tablet (4 mg total) by mouth every 6 (six) hours as needed for muscle spasms. 30 tablet 0   traMADol (ULTRAM) 50 MG tablet Take 1 tablet (50 mg total) by mouth 2 (two) times daily. 60 tablet 3   Turmeric 450 MG CAPS Take by mouth.      vitamin B-12 (CYANOCOBALAMIN) 100 MCG tablet Take 50 mcg by mouth daily.     vitamin E 400 UNIT capsule  Take 400 Units by mouth daily.     rosuvastatin (CRESTOR) 10 MG tablet Take 1 tablet (10 mg total) by mouth daily. (Patient not taking: Reported on 10/04/2020) 90 tablet 1   No facility-administered medications prior to visit.    Review of Systems  Constitutional:  Negative for chills, fever, malaise/fatigue and weight loss.  HENT:  Negative for hearing loss, sore throat and tinnitus.   Eyes:  Negative for blurred vision and double vision.  Respiratory:  Negative for cough, hemoptysis, sputum production, shortness of breath, wheezing and stridor.   Cardiovascular:  Negative for chest pain, palpitations, orthopnea, leg swelling and PND.  Gastrointestinal:  Negative for abdominal pain, constipation, diarrhea, heartburn, nausea and vomiting.  Genitourinary:  Negative for dysuria, hematuria and urgency.  Musculoskeletal:  Negative for joint pain and myalgias.  Skin:  Negative for itching and rash.  Neurological:  Negative for dizziness, tingling, weakness and headaches.  Endo/Heme/Allergies:  Negative for environmental allergies. Does not bruise/bleed easily.  Psychiatric/Behavioral:  Negative for depression. The patient is not nervous/anxious and does not have insomnia.   All other systems reviewed and are negative.   Objective:  Physical Exam Vitals reviewed.  Constitutional:      General: He is not in acute distress.    Appearance: He is well-developed. He is obese.  HENT:     Head: Normocephalic and atraumatic.  Eyes:     General: No scleral icterus.    Conjunctiva/sclera: Conjunctivae normal.     Pupils: Pupils are equal, round, and reactive to light.  Neck:     Vascular: No JVD.     Trachea: No tracheal deviation.  Cardiovascular:     Rate and Rhythm: Normal rate and regular rhythm.     Heart sounds: Normal heart sounds. No murmur heard. Pulmonary:     Effort: Pulmonary  effort is normal. No tachypnea, accessory muscle usage or respiratory distress.     Breath sounds: Normal breath sounds. No stridor. No wheezing, rhonchi or rales.  Abdominal:     General: Bowel sounds are normal. There is no distension.     Palpations: Abdomen is soft.     Tenderness: There is no abdominal tenderness.  Musculoskeletal:        General: No tenderness.     Cervical back: Neck supple.  Lymphadenopathy:     Cervical: No cervical adenopathy.  Skin:    General: Skin is warm and dry.     Capillary Refill: Capillary refill takes less than 2 seconds.     Findings: No rash.  Neurological:     Mental Status: He is alert and oriented to person, place, and time.  Psychiatric:        Behavior: Behavior normal.     Vitals:   10/04/20 0918  BP: (!) 128/92  Pulse: 98  Temp: 98.3 F (36.8 C)  TempSrc: Oral  SpO2: 97%  Weight: 213 lb 8 oz (96.8 kg)  Height: 5' 11.5" (1.816 m)    97% on RA BMI Readings from Last 3 Encounters:  10/04/20 29.36 kg/m  08/09/20 28.07 kg/m  05/13/20 27.94 kg/m   Wt Readings from Last 3 Encounters:  10/04/20 213 lb 8 oz (96.8 kg)  08/09/20 207 lb (93.9 kg)  05/13/20 206 lb (93.4 kg)     CBC    Component Value Date/Time   WBC 7.1 07/08/2020 1409   RBC 4.40 07/08/2020 1409   HGB 13.3 07/08/2020 1409   HCT 40.0 07/08/2020 1409   PLT 261.0  07/08/2020 1409   MCV 91.0 07/08/2020 1409   MCH 29.9 05/02/2019 1432   MCHC 33.2 07/08/2020 1409   RDW 13.2 07/08/2020 1409   LYMPHSABS 1.7 08/17/2017 0035   MONOABS 1.1 (H) 08/17/2017 0035   EOSABS 0.2 08/17/2017 0035   BASOSABS 0.0 08/17/2017 0035     Chest Imaging: Chest x-ray 06/23/2019 2 view No acute abnormality, no infiltrate The patient's images have been independently reviewed by me.    Pulmonary Functions Testing Results: PFT Results Latest Ref Rng & Units 10/27/2019  FVC-Pre L 3.44  FVC-Predicted Pre % 71  FVC-Post L 3.87  FVC-Predicted Post % 80  Pre FEV1/FVC % % 65   Post FEV1/FCV % % 65  FEV1-Pre L 2.23  FEV1-Predicted Pre % 61  FEV1-Post L 2.50  DLCO uncorrected ml/min/mmHg 27.14  DLCO UNC% % 97  DLCO corrected ml/min/mmHg 27.14  DLCO COR %Predicted % 97  DLVA Predicted % 102  TLC L 6.95  TLC % Predicted % 97  RV % Predicted % 142    FeNO: none   Pathology: none   Echocardiogram: 2018: normal EF, G1DF  Heart Catheterization: None     Assessment & Plan:     ICD-10-CM   1. COPD, moderate ([place])  J44.9     2. Asthma-COPD overlap syndrome ([place])  J44.9     3. Tobacco abuse  Z72.0     4. 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 Rhinitis medicamentosa  J31.0    T48.5X5A      Discussion:  This is a 65 year old gentleman, former smoker quit smoking tobacco cigarettes in 2015, replaced that with vaping.  Still vaping daily.  At the time last office visit was addicted to [place] with recurrent rhinitis.  He is able to stop this and wean off at as of last visit.  From respiratory standpoint he is breathing well and is stable.  He is using his Trelegy daily for is moderate COPD.  Plan: Continue Trelegy. Refills as needed for shortness of breath and wheezing, can use albuterol Recommend continue avoidance of daily Afrin use. Patient was counseled on vaping cessation. Patient was also counseled on lung cancer screening program. He will think about this further and contact our office if he is interested in enrolling.    Current Outpatient Medications:    celecoxib (CELEBREX) 100 MG capsule, Take 2 capsules (200 mg total) by mouth daily., Disp: 60 capsule, Rfl: 1   cholecalciferol (VITAMIN D3) 25 MCG (1000 UT) tablet, Take 1,000 Units by mouth 2 (two) times daily., Disp: , Rfl:    clonazePAM (KLONOPIN) 0.5 MG tablet, TAKE 1 TABLET (0.5 MG TOTAL) BY MOUTH AT BEDTIME AS NEEDED FOR ANXIETY., Disp: 30 tablet, Rfl: 5   diltiazem (CARDIZEM) 30 MG tablet, Take one tablet by mouth every 4 hours AS NEEDED for A-fib HR >  100 as long as BP > 100, Disp: 30 tablet, Rfl: 2   esomeprazole (NEXIUM) 20 MG capsule, Take 20 mg by mouth daily at 12 noon., Disp: , Rfl:    Fluticasone-Umeclidin-Vilant (TRELEGY ELLIPTA) 100-62.5-25 MCG/INH AEPB, Inhale 1 puff into the lungs daily., Disp: 180 each, Rfl: 0   ipratropium (ATROVENT) 0.06 % nasal spray, USE 2 SPRAYS IN EACH NOSTRIL UP TO THREE TIMES DAILY., Disp: 15 mL, Rfl: 3   methylPREDNISolone (MEDROL DOSEPAK) 4 MG TBPK tablet, Take 4 pills day 1, 3 pills day 2, 2 pills day 3, 1 pill  day 4, 1/2 tab day 5, Disp: 21 tablet, Rfl: 0   montelukast (SINGULAIR) 10 MG tablet, TAKE 1 TABLET BY MOUTH EVERY DAY, Disp: 90 tablet, Rfl: 1   MULTAQ 400 MG tablet, TAKE 1 TABLET BY MOUTH 2 TIMES DAILY AS NEEDED (BREAKTHROUGH AFIB), Disp: 60 tablet, Rfl: 11   Multiple Vitamins-Minerals (MULTIVITAMIN WITH MINERALS) tablet, Take 1 tablet by mouth daily., Disp: , Rfl:    naproxen (NAPROSYN) 500 MG tablet, TAKE 1 TABLET BY MOUTH TWICE A DAY AS NEEDED, Disp: 60 tablet, Rfl: 1   NON FORMULARY, Terbinafine, Disp: , Rfl:    olmesartan (BENICAR) 40 MG tablet, TAKE 1 TABLET BY MOUTH EVERY DAY, Disp: 90 tablet, Rfl: 3   Omega-3 Fatty Acids (FISH OIL) 1200 MG CAPS, Take by mouth., Disp: , Rfl:    peppermint oil liquid, by Does not apply route as needed., Disp: , Rfl:    sildenafil (VIAGRA) 100 MG tablet, Take 1 tablet (100 mg total) by mouth daily as needed for erectile dysfunction., Disp: 20 tablet, Rfl: 4   Specialty Vitamins Products (MAGNESIUM, AMINO ACID CHELATE,) 133 MG tablet, Take 1 tablet by mouth 2 (two) times a week., Disp: , Rfl:    Testosterone 40.5 MG/2.5GM (1.62%) GEL, APPLY ONE PACKET ONTO THE SKIN DAILY., Disp: 75 packet, Rfl: 5   tiZANidine (ZANAFLEX) 4 MG tablet, Take 1 tablet (4 mg total) by mouth every 6 (six) hours as needed for muscle spasms., Disp: 30 tablet, Rfl: 0   traMADol (ULTRAM) 50 MG tablet, Take 1 tablet (50 mg total) by mouth 2 (two) times daily., Disp: 60 tablet, Rfl: 3    Turmeric 450 MG CAPS, Take by mouth., Disp: , Rfl:    vitamin B-12 (CYANOCOBALAMIN) 100 MCG tablet, Take 50 mcg by mouth daily., Disp: , Rfl:    vitamin E 400 UNIT capsule, Take 400 Units by mouth daily., Disp: , Rfl:    Garner Nash, DO [facility] 10/04/2020 9:28 AM

## 2020-10-04 NOTE — Patient Instructions (Addendum)
Thank you for visiting Dr. Valeta Harms at [facility]. Today we recommend the following:  Continue trelegy  Stay off afrin.   Return in about 1 year (around 10/04/2021) for with APP or Dr. Valeta Harms.    Please do your part to reduce the spread of COVID-19.    Lung Cancer Screening A lung cancer screening is a test that checks for lung cancer. Lung cancer screening is done to look for lung cancer in its very early stages when you are not likely to have any symptoms and before it spreads beyond the lung, making it harder to treat. Finding cancer early improves the chances of successfultreatment. It may save your life. Who should have screening? You should be screened for lung cancer if all of these apply: You currently smoke or you have quit smoking within the past 15 years. You are 16-17 years old. Screening may be recommended up to age 58 depending on your overall health and other factors. You are in good general health. You have a smoking history of 1 pack of cigarettes a day for 20 years or 2 packs a day for 10 years. Screening may also be recommended if you are at high risk for the disease. You may be at high risk if: You have a family history of lung cancer. You have been exposed to asbestos or radon. You have chronic obstructive pulmonary disease (COPD). How is screening done?  The recommended screening test is a low-dose computed tomography (LDCT) scan. This scan takes detailed images of the lungs. This allows a health care provider to look for abnormal cells. If you are at risk for lung cancer, it is recommended that you get screened once a year. Talk to your health careprovider about the risks, benefits, and limitations of screening. What are the benefits of screening? Screening can find lung cancer early, before symptoms start and before it has spread outside of the lungs. The chances of curing lung cancer are greater ifthe cancer is diagnosed early. What are the risks of  screening? The screening may show lung cancer when no cancer is present (false-positive result). The screening may not find lung cancer when it is present. The person gets exposed to radiation. How can I lower my risk of lung cancer? Make these lifestyle changes to lower your risk of developing lung cancer: Do not use any products that contain nicotine or tobacco, such as cigarettes, e-cigarettes, and chewing tobacco. If you need help quitting, ask your health care provider. Avoid secondhand smoke. Avoid exposure to radiation. Avoid exposure to radon gas. Have your home checked for radon regularly. Avoid things that cause cancer (carcinogens). Avoid living or working in places with high air pollution. Questions to ask your health care provider Am I eligible for lung cancer screening? Does my health insurance cover the cost of lung cancer screening? What happens if the lung cancer screening shows something of concern? How soon will I have results from my lung cancer screening? Is there anything that I need to do to prepare for my lung cancer screening? What happens if I decide not to have lung cancer screening? Where to find more information Ask your health care provider about the risks and benefits of screening. More information and resources are available from these organizations: [place] (ACS): www.cancer.org American Lung Association: www.lung.org Contact a health care provider if: You start to show symptoms of lung cancer, including: Coughing that will not go away. Making whistling sounds when you breathe (wheezing). Chest pain.  Coughing up blood. Shortness of breath. Weight loss that cannot be explained. Constant tiredness (fatigue). Hoarse voice. Summary Lung cancer screening may find lung cancer before symptoms appear. Finding cancer early improves the chances of successful treatment. It may save your life. The recommended screening test is a low-dose  computed tomography (LDCT) scan that looks for abnormal cells in the lungs. If you are at risk for lung cancer, it is recommended that you get screened once a year. You can make lifestyle changes to lower your risk of lung cancer. Ask your health care provider about the risks and benefits of screening. This information is not intended to replace advice given to you by your health care provider. Make sure you discuss any questions you have with your healthcare provider. Document Revised: 03/25/2020 Document Reviewed: 03/25/2019 Elsevier Patient Education  2022 [employer].

## 2020-10-05 ENCOUNTER — Other Ambulatory Visit: Payer: Self-pay | Admitting: Family Medicine

## 2020-10-26 ENCOUNTER — Ambulatory Visit: Payer: 59 | Admitting: Family Medicine

## 2020-11-02 ENCOUNTER — Telehealth: Payer: Self-pay | Admitting: Family Medicine

## 2020-11-02 MED ORDER — METHYLPREDNISOLONE 4 MG PO TBPK: ORAL_TABLET | ORAL | 0 refills | Status: DC

## 2020-11-02 NOTE — Telephone Encounter (Signed)
Sent in medicine

## 2020-11-02 NOTE — Telephone Encounter (Signed)
Pt still waiting for insurance approval of his epidural. He was doing well for a few weeks but now has bilateral arm numbness and R arm pain.  Pt would like another cycle of methylprednisone, he says he cannot tolerate regular prednisone. Would like today if possible.

## 2020-11-16 ENCOUNTER — Other Ambulatory Visit: Payer: Self-pay | Admitting: Internal Medicine

## 2020-11-16 ENCOUNTER — Encounter: Payer: Self-pay | Admitting: Internal Medicine

## 2020-11-16 DIAGNOSIS — E291 Testicular hypofunction: Secondary | ICD-10-CM

## 2020-11-16 MED ORDER — TESTOSTERONE 50 MG/5GM (1%) TD GEL: 10.0000 g | Freq: Every day | TRANSDERMAL | 0 refills | Status: DC

## 2020-11-23 ENCOUNTER — Other Ambulatory Visit (HOSPITAL_COMMUNITY): Payer: Self-pay

## 2020-11-23 MED ORDER — MULTAQ 400 MG PO TABS
ORAL_TABLET | ORAL | 0 refills | Status: DC
Start: 2020-11-23 — End: 2022-03-29

## 2020-11-25 ENCOUNTER — Encounter: Payer: Self-pay | Admitting: Internal Medicine

## 2020-11-25 ENCOUNTER — Ambulatory Visit (INDEPENDENT_AMBULATORY_CARE_PROVIDER_SITE_OTHER): Payer: 59 | Admitting: Internal Medicine

## 2020-11-25 ENCOUNTER — Other Ambulatory Visit: Payer: Self-pay

## 2020-11-25 VITALS — BP 164/92 | HR 105 | Temp 98.6°F | Ht 71.5 in | Wt 208.0 lb

## 2020-11-25 DIAGNOSIS — J309 Allergic rhinitis, unspecified: Secondary | ICD-10-CM | POA: Diagnosis not present

## 2020-11-25 DIAGNOSIS — I480 Paroxysmal atrial fibrillation: Secondary | ICD-10-CM | POA: Diagnosis not present

## 2020-11-25 DIAGNOSIS — E291 Testicular hypofunction: Secondary | ICD-10-CM

## 2020-11-25 DIAGNOSIS — I10 Essential (primary) hypertension: Secondary | ICD-10-CM

## 2020-11-25 LAB — BASIC METABOLIC PANEL
BUN: 22 mg/dL (ref 6–23)
CO2: 28 mEq/L (ref 19–32)
Calcium: 9.5 mg/dL (ref 8.4–10.5)
Chloride: 101 mEq/L (ref 96–112)
Creatinine, Ser: 1.22 mg/dL (ref 0.40–1.50)
GFR: 63.09 mL/min (ref >60.00)
Glucose, Bld: 102 mg/dL — ABNORMAL HIGH (ref 70–99)
Potassium: 4.1 mEq/L (ref 3.5–5.1)
Sodium: 137 mEq/L (ref 135–145)

## 2020-11-25 LAB — CBC WITH DIFFERENTIAL/PLATELET
Basophils Absolute: 0.1 10*3/uL (ref 0.0–0.1)
Basophils Relative: 0.8 % (ref 0.0–3.0)
Eosinophils Absolute: 0.3 10*3/uL (ref 0.0–0.7)
Eosinophils Relative: 3.7 % (ref 0.0–5.0)
HCT: 41.8 % (ref 39.0–52.0)
Hemoglobin: 13.7 g/dL (ref 13.0–17.0)
Lymphocytes Relative: 18.5 % (ref 12.0–46.0)
Lymphs Abs: 1.4 10*3/uL (ref 0.7–4.0)
MCHC: 32.8 g/dL (ref 30.0–36.0)
MCV: 84.7 fl (ref 78.0–100.0)
Monocytes Absolute: 0.9 10*3/uL (ref 0.1–1.0)
Monocytes Relative: 12.5 % — ABNORMAL HIGH (ref 3.0–12.0)
Neutro Abs: 4.8 10*3/uL (ref 1.4–7.7)
Neutrophils Relative %: 64.5 % (ref 43.0–77.0)
Platelets: 209 10*3/uL (ref 150.0–400.0)
RBC: 4.94 Mil/uL (ref 4.22–5.81)
RDW: 14.1 % (ref 11.5–15.5)
WBC: 7.5 10*3/uL (ref 4.0–10.5)

## 2020-11-25 MED ORDER — IPRATROPIUM BROMIDE 0.06 % NA SOLN: 1.0000 | Freq: Two times a day (BID) | NASAL | 1 refills | Status: DC

## 2020-11-25 MED ORDER — INDAPAMIDE 1.25 MG PO TABS: 1.2500 mg | ORAL_TABLET | Freq: Every day | ORAL | 0 refills | Status: DC

## 2020-11-25 MED ORDER — DILTIAZEM HCL ER COATED BEADS 120 MG PO CP24: 120.0000 mg | ORAL_CAPSULE | Freq: Every day | ORAL | 1 refills | Status: DC

## 2020-11-25 MED ORDER — OLMESARTAN MEDOXOMIL 40 MG PO TABS: 40.0000 mg | ORAL_TABLET | Freq: Every day | ORAL | 0 refills | Status: DC

## 2020-11-25 NOTE — Patient Instructions (Signed)

## 2020-11-25 NOTE — Progress Notes (Signed)
Subjective:  Patient ID: John [name], male    DOB: 10-26-1955  Age: 65 y.o. MRN: [id number]  CC: Hypertension  This visit occurred during the SARS-CoV-2 public health emergency.  Safety protocols were in place, including screening questions prior to the visit, additional usage of staff PPE, and extensive cleaning of exam room while observing appropriate contact time as indicated for disinfecting solutions.    HPI John [name] presents for f/up -  He is active and denies any recent episodes of chest pain, shortness of breath, diaphoresis, dizziness, lightheadedness, edema, or fatigue.  He continues to complain of symptoms related to low T with ED and low libido.  He needs a new dosage of his testosterone supplement.  Outpatient Medications Prior to Visit  Medication Sig Dispense Refill   cholecalciferol (VITAMIN D3) 25 MCG (1000 UT) tablet Take 1,000 Units by mouth 2 (two) times daily.     clonazePAM (KLONOPIN) 0.5 MG tablet TAKE 1 TABLET (0.5 MG TOTAL) BY MOUTH AT BEDTIME AS NEEDED FOR ANXIETY. 30 tablet 5   dronedarone (MULTAQ) 400 MG tablet TAKE 1 TABLET BY MOUTH 2 TIMES DAILY AS NEEDED (BREAKTHROUGH AFIB) 36 tablet 0   esomeprazole (NEXIUM) 20 MG capsule Take 20 mg by mouth daily at 12 noon.     Fluticasone-Umeclidin-Vilant (TRELEGY ELLIPTA) 100-62.5-25 MCG/INH AEPB Inhale 1 puff into the lungs daily. 180 each 0   montelukast (SINGULAIR) 10 MG tablet TAKE 1 TABLET BY MOUTH EVERY DAY 90 tablet 1   Multiple Vitamins-Minerals (MULTIVITAMIN WITH MINERALS) tablet Take 1 tablet by mouth daily.     NON FORMULARY Terbinafine     Omega-3 Fatty Acids (FISH OIL) 1200 MG CAPS Take by mouth.     peppermint oil liquid by Does not apply route as needed.     sildenafil (VIAGRA) 100 MG tablet Take 1 tablet (100 mg total) by mouth daily as needed for erectile dysfunction. 20 tablet 4   Specialty Vitamins Products (MAGNESIUM, AMINO ACID CHELATE,) 133 MG tablet Take 1 tablet by mouth 2 (two) times a week.      tiZANidine (ZANAFLEX) 4 MG tablet Take 1 tablet (4 mg total) by mouth every 6 (six) hours as needed for muscle spasms. 30 tablet 0   traMADol (ULTRAM) 50 MG tablet Take 1 tablet (50 mg total) by mouth 2 (two) times daily. 60 tablet 3   Turmeric 450 MG CAPS Take by mouth.     vitamin B-12 (CYANOCOBALAMIN) 100 MCG tablet Take 50 mcg by mouth daily.     celecoxib (CELEBREX) 100 MG capsule Take 2 capsules (200 mg total) by mouth daily. 60 capsule 1   diltiazem (CARDIZEM) 30 MG tablet Take one tablet by mouth every 4 hours AS NEEDED for A-fib HR > 100 as long as BP > 100 30 tablet 2   ipratropium (ATROVENT) 0.06 % nasal spray Place 2 sprays into both nostrils 3 (three) times daily. 90 mL 1   naproxen (NAPROSYN) 500 MG tablet TAKE 1 TABLET BY MOUTH TWICE A DAY AS NEEDED 60 tablet 1   olmesartan (BENICAR) 40 MG tablet TAKE 1 TABLET BY MOUTH EVERY DAY 90 tablet 3   testosterone (ANDROGEL) 50 MG/5GM (1%) GEL Place 10 g onto the skin daily. 450 g 0   No facility-administered medications prior to visit.    ROS Review of Systems  Constitutional:  Negative for diaphoresis and fatigue.  HENT: Negative.    Eyes:  Negative for visual disturbance.  Respiratory:  Negative for chest  tightness, shortness of breath and wheezing.   Cardiovascular:  Negative for chest pain, palpitations and leg swelling.  Gastrointestinal:  Negative for abdominal pain, constipation, diarrhea, nausea and vomiting.  Endocrine: Negative.   Genitourinary: Negative.  Negative for difficulty urinating.  Musculoskeletal: Negative.  Negative for arthralgias and myalgias.  Skin: Negative.   Neurological: Negative.  Negative for dizziness and weakness.  Hematological:  Negative for adenopathy. Does not bruise/bleed easily.  Psychiatric/Behavioral: Negative.     Objective:  BP (!) 164/92 (BP Location: Left Arm, Patient Position: Sitting, Cuff Size: Large)   Pulse (!) 105   Temp 98.6 F (37 C) (Oral)   Ht 5' 11.5" (1.816 m)    Wt 208 lb (94.3 kg)   SpO2 97%   BMI 28.61 kg/m   BP Readings from Last 3 Encounters:  11/25/20 (!) 164/92  10/04/20 (!) 128/92  08/09/20 (!) 148/86    Wt Readings from Last 3 Encounters:  11/25/20 208 lb (94.3 kg)  10/04/20 213 lb 8 oz (96.8 kg)  08/09/20 207 lb (93.9 kg)    Physical Exam  Lab Results  Component Value Date   WBC 7.5 11/25/2020   HGB 13.7 11/25/2020   HCT 41.8 11/25/2020   PLT 209.0 11/25/2020   GLUCOSE 102 (H) 11/25/2020   CHOL 211 (H) 05/06/2020   TRIG 102.0 05/06/2020   HDL 40.90 05/06/2020   LDLCALC 150 (H) 05/06/2020   ALT 19 07/08/2020   AST 16 07/08/2020   NA 137 11/25/2020   K 4.1 11/25/2020   CL 101 11/25/2020   CREATININE 1.22 11/25/2020   BUN 22 11/25/2020   CO2 28 11/25/2020   TSH 1.38 05/06/2020   PSA 0.84 05/06/2020   HGBA1C 5.7 05/06/2020    DG INJECT DIAG/THERA/INC NEEDLE/CATH/PLC EPI/CERV/THOR W/IMG  Result Date: 06/15/2020 CLINICAL DATA:  Cervical spondylosis without myelopathy. The patient reports complete relief following the epidural injection on 04/07/2020 with pain returning after heavy labor in February. There is pain in the right arm as well as new numbness in the left arm. FLUOROSCOPY TIME:  Fluoroscopy Time: 20 seconds Radiation Exposure Index: 8.52 microGray*m^2 PROCEDURE: The procedure, risks, benefits, and alternatives were explained to the patient. Questions regarding the procedure were encouraged and answered. The patient understands and consents to the procedure. CERVICAL EPIDURAL INJECTION An interlaminar approach was performed on the right at C7-T1. A 3.5 inch 20 gauge epidural needle was advanced using loss-of-resistance technique. DIAGNOSTIC EPIDURAL INJECTION Injection of Isovue-M 300 shows a good epidural pattern with spread above and below the level of needle placement, bilaterally but greater on the right. No vascular opacification is seen. THERAPEUTIC EPIDURAL INJECTION 1.5 ml of Kenalog 40 mixed with 2 ml of  normal saline were then instilled. The procedure was well-tolerated, and the patient was discharged thirty minutes following the injection in good condition. IMPRESSION: Technically successful interlaminar epidural injection on the right at C7-T1. Electronically Signed   By: Logan Bores M.D.   On: 06/15/2020 11:25    Assessment & Plan:   Atzin was seen today for hypertension.  Diagnoses and all orders for this visit:  Primary hypertension- He has not achieved his blood pressure goal of 130/80.  Will add indapamide and diltiazem to the ARB. -     indapamide (LOZOL) 1.25 MG tablet; Take 1 tablet (1.25 mg total) by mouth daily. -     olmesartan (BENICAR) 40 MG tablet; Take 1 tablet (40 mg total) by mouth daily. -     diltiazem (CARDIZEM  CD) 120 MG 24 hr capsule; Take 1 capsule (120 mg total) by mouth daily. -     CBC with Differential/Platelet; Future -     Basic metabolic panel; Future -     Testosterone Total,Free,Bio, Males; Future -     Testosterone Total,Free,Bio, Males -     Basic metabolic panel -     CBC with Differential/Platelet  PAF (paroxysmal atrial fibrillation) ([place])- He has good rate and rhythm control but is tachycardic.  Will add a daily dose of diltiazem. -     diltiazem (CARDIZEM CD) 120 MG 24 hr capsule; Take 1 capsule (120 mg total) by mouth daily.  Hypogonadism, male -     CBC with Differential/Platelet; Future -     Testosterone Total,Free,Bio, Males; Future -     Testosterone Total,Free,Bio, Males -     CBC with Differential/Platelet -     Testosterone 40.5 MG/2.5GM (1.62%) GEL; Place 2 packets onto the skin daily.  Allergic rhinitis, unspecified seasonality, unspecified trigger -     ipratropium (ATROVENT) 0.06 % nasal spray; Place 1 spray into both nostrils 2 (two) times daily.  I have discontinued Monson Marcille. Tine "Steve"'s diltiazem, celecoxib, naproxen, and testosterone. I have also changed his olmesartan and ipratropium. Additionally, I am having him start  on indapamide, diltiazem, and Testosterone. Lastly, I am having him maintain his multivitamin with minerals, vitamin B-12, magnesium (amino acid chelate), Fish Oil, cholecalciferol, Turmeric, esomeprazole, NON FORMULARY, clonazePAM, peppermint oil, sildenafil, tiZANidine, Trelegy Ellipta, traMADol, montelukast, and Multaq.  Meds ordered this encounter  Medications   indapamide (LOZOL) 1.25 MG tablet    Sig: Take 1 tablet (1.25 mg total) by mouth daily.    Dispense:  90 tablet    Refill:  0   olmesartan (BENICAR) 40 MG tablet    Sig: Take 1 tablet (40 mg total) by mouth daily.    Dispense:  90 tablet    Refill:  0   diltiazem (CARDIZEM CD) 120 MG 24 hr capsule    Sig: Take 1 capsule (120 mg total) by mouth daily.    Dispense:  90 capsule    Refill:  1   ipratropium (ATROVENT) 0.06 % nasal spray    Sig: Place 1 spray into both nostrils 2 (two) times daily.    Dispense:  90 mL    Refill:  1   Testosterone 40.5 MG/2.5GM (1.62%) GEL    Sig: Place 2 packets onto the skin daily.    Dispense:  180 packet    Refill:  0     Follow-up: Return in about 3 months (around 02/25/2021).  Scarlette Calico, MD

## 2020-11-26 ENCOUNTER — Encounter: Payer: Self-pay | Admitting: Internal Medicine

## 2020-11-26 LAB — TESTOSTERONE TOTAL,FREE,BIO, MALES
Albumin: 4.2 g/dL (ref 3.6–5.1)
Sex Hormone Binding: 30 nmol/L (ref 22–77)
Testosterone: 131 ng/dL — ABNORMAL LOW (ref 250–827)

## 2020-11-26 MED ORDER — TESTOSTERONE 40.5 MG/2.5GM (1.62%) TD GEL: 2.0000 | Freq: Every day | TRANSDERMAL | 0 refills | Status: DC

## 2020-12-02 ENCOUNTER — Ambulatory Visit
Admission: RE | Admit: 2020-12-02 | Discharge: 2020-12-02 | Disposition: A | Discharge status: Home / Self Care | Payer: 59 | Source: Ambulatory Visit | Attending: Family Medicine | Admitting: Family Medicine

## 2020-12-02 ENCOUNTER — Encounter: Payer: Self-pay | Admitting: Internal Medicine

## 2020-12-02 DIAGNOSIS — M5010 Cervical disc disorder with radiculopathy, unspecified cervical region: Secondary | ICD-10-CM

## 2020-12-02 MED ORDER — TRIAMCINOLONE ACETONIDE 40 MG/ML IJ SUSP (RADIOLOGY)
60.0000 mg | Freq: Once | INTRAMUSCULAR | Status: AC
  Administered 2020-12-02: 60 mg via EPIDURAL

## 2020-12-02 MED ORDER — IOPAMIDOL (ISOVUE-M 300) INJECTION 61%
1.0000 mL | Freq: Once | INTRAMUSCULAR | Status: AC
  Administered 2020-12-02: 1 mL via EPIDURAL

## 2020-12-02 NOTE — Discharge Instructions (Signed)

## 2020-12-03 ENCOUNTER — Other Ambulatory Visit: Payer: Self-pay | Admitting: Internal Medicine

## 2020-12-03 ENCOUNTER — Telehealth: Payer: Self-pay | Admitting: Internal Medicine

## 2020-12-03 DIAGNOSIS — I10 Essential (primary) hypertension: Secondary | ICD-10-CM

## 2020-12-03 DIAGNOSIS — E291 Testicular hypofunction: Secondary | ICD-10-CM

## 2020-12-03 MED ORDER — XYOSTED 50 MG/0.5ML ~~LOC~~ SOAJ: 50.0000 mg | SUBCUTANEOUS | 0 refills | Status: DC

## 2020-12-03 NOTE — Telephone Encounter (Signed)
Patient calling in about insurance not covering Testosterone '5mg'$  tablet but they do cover the Testosterone 40.5 MG/2.5GM (1.62%) GEL  Please see prev MyChart Message patient sent & call him as soon as possible [phone]

## 2020-12-06 ENCOUNTER — Other Ambulatory Visit: Payer: Self-pay

## 2020-12-06 ENCOUNTER — Telehealth: Payer: Self-pay | Admitting: Pulmonary Disease

## 2020-12-06 DIAGNOSIS — E291 Testicular hypofunction: Secondary | ICD-10-CM

## 2020-12-06 MED ORDER — TRELEGY ELLIPTA 100-62.5-25 MCG/INH IN AEPB: 1.0000 | INHALATION_SPRAY | Freq: Every day | RESPIRATORY_TRACT | 1 refills | Status: DC

## 2020-12-06 NOTE — Telephone Encounter (Signed)
Pt stated that he is squeamish of needles and prevers not to do an injection once he was informed that a testosterone injections was Rx'd for him. I offered him weekly nurse visits to have it done and he prefers to do the gel as stated in the previous message but doubling up for insurance to cover it. Please advise.

## 2020-12-06 NOTE — Telephone Encounter (Signed)
Called and spoke with patient. He was calling to request a refill on his Trelegy 100. He had been told that [facility] had contacted our office last week. I apologized to him since this was not done. I have sent in a refill for him. He is aware.   Nothing further needed at time of call.

## 2020-12-07 NOTE — Telephone Encounter (Signed)
Pt is requesting to double up on the 2.'5mg'$  gel packs.  I have copied the MyChart message below:  Dr. Ronnald Ramp my insurance company will not for the '5mg'$  testosterone. I did not know this. They will only pay for the 2.5 mg gel that I was taking.  Would you want to double the RX to apply 2 packets per day? Would that accomplish the same thing?  [employer] insurance is awful but I'm stuck with them until year end.  So sorry to bother you but [facility] kept having problems & called me to tell me yesterday.  Thank you.

## 2020-12-09 ENCOUNTER — Other Ambulatory Visit: Payer: Self-pay | Admitting: Internal Medicine

## 2020-12-09 DIAGNOSIS — E291 Testicular hypofunction: Secondary | ICD-10-CM

## 2020-12-09 MED ORDER — TESTOSTERONE 40.5 MG/2.5GM (1.62%) TD GEL: 2.0000 | Freq: Every day | TRANSDERMAL | 0 refills | Status: DC

## 2020-12-09 NOTE — Telephone Encounter (Signed)
After speaking with pharmacy patient has decided that he will give the Testerone injections a chance & see how he does w/ those  He says after speaking w/ pharmacy he has gotten more info about how to use them & feels comfortable that he can use them successfully  Please follow up when order has been sent to pharmacy

## 2020-12-17 NOTE — Progress Notes (Signed)
[facility] [place] [place] Phone: [phone] Subjective:   John [name], am serving as a scribe for Dr. Hulan Saas.  This visit occurred during the SARS-CoV-2 public health emergency.  Safety protocols were in place, including screening questions prior to the visit, additional usage of staff PPE, and extensive cleaning of exam room while observing appropriate contact time as indicated for disinfecting solutions.   I'm seeing this patient by the request  of:  Janith Lima, MD  CC: Neck pain follow-up and right thumb pain  [id number]  03/25/2020 Patient is a 65 year old gentleman with neck pain with cervical radiculopathy that is consistent with patient's spinal stenosis that has been diagnosed with the MRI.  We discussed with patient in great length and we will try an epidural.  Patient has had multiple epidurals previously in his lumbar spine and understands the risks and benefits.  Unfortunately at this point he is having so much pain and not responding to the naproxen, tramadol, trazodone or muscle relaxer she is tried.  Patient is wanting steroids.  Patient has had 2 episodes of atrial fibrillation previously that seem to be secondary to prednisone.  Patient states though he has been given methylprednisolone previously.  Reviewed chart and in March he was given this.  We discussed with him about the potential risk.  Patient understands this.  Athletic trainer did hear this as well.  Understands the risk and knows if he has any heart issues he will go immediately.  We will do a half dose of the methylprednisolone for a 5-day burst.  Patient understands this and has done it in the past he states with good success.  Then patient will hopefully have the epidural in the near future and then we would like to see patient back again in 3 to 4 weeks.  Worsening pain patient will need to see neurosurgery.  Patient does do his lawn care and  if needed surgical intervention would want to do it in next fall    Update 12/20/2020 John [name] is a 65 y.o. male coming in with complaint of neck pain. Epidural on 12/02/2020. Patient states has been doing well. Epidural did help. He does have pain when using Ipad but not at any other time.  Patient is having more of the trouble with the right thumb.  Getting stuck in a flexed position.  Moderate to severe tenderness noted.  Has developed trigger thumb in R hand. Patient is concerned about the neck would like to discuss with a surgeon so patient understands the next treatment options if necessary.  MRI which was independently visualized again today does show moderate spinal stenosis noted mostly at the C4-C6.      Past Medical History:  Diagnosis Date   Afib ([place])    Anxiety and depression    Asthma    Emphysema of lung ([place])    GERD (gastroesophageal reflux disease)    Hernia    right inguinal   History of bronchitis    Hyperlipidemia    Hypertension    Morton's neuroma    OA (osteoarthritis)    neck   Obstructive sleep apnea on CPAP    Pancreatitis    Torn meniscus    Left   Past Surgical History:  Procedure Laterality Date   BONE CYST EXCISION  1991   left wrist   COLONOSCOPY     HERNIA REPAIR  04/14/11   right inguinal  HERNIA REPAIR     left inguinal, december 2020   Social History   Socioeconomic History   Marital status: Divorced    Spouse name: Not on file   Number of children: Not on file   Years of education: Not on file   Highest education level: Not on file  Occupational History   Not on file  Tobacco Use   Smoking status: Former    Packs/day: 1.50    Years: 38.00    Pack years: 57.00    Types: Cigarettes    Quit date: 2015    Years since quitting: 7.7   Smokeless tobacco: Never  Vaping Use   Vaping Use: Every day  Substance and Sexual Activity   Alcohol use: Not Currently   Drug use: [name]   Sexual activity: Yes    Partners: Female   Other Topics Concern   Not on file  Social History Narrative   Not on file   Social Determinants of Health   Financial Resource Strain: Not on file  Food Insecurity: Not on file  Transportation Needs: Not on file  Physical Activity: Not on file  Stress: Not on file  Social Connections: Not on file   [name] Known Allergies Family History  Problem Relation Age of Onset   Hypertension Mother    Dementia Other    Hypertension Sister    Hypertension Brother     Current Outpatient Medications (Endocrine & Metabolic):    Testosterone 40.5 MG/2.5GM (1.62%) GEL, Place 2 packets onto the skin daily.  Current Outpatient Medications (Cardiovascular):    diltiazem (CARDIZEM CD) 120 MG 24 hr capsule, Take 1 capsule (120 mg total) by mouth daily.   dronedarone (MULTAQ) 400 MG tablet, TAKE 1 TABLET BY MOUTH 2 TIMES DAILY AS NEEDED (BREAKTHROUGH AFIB)   indapamide (LOZOL) 1.25 MG tablet, Take 1 tablet (1.25 mg total) by mouth daily.   olmesartan (BENICAR) 40 MG tablet, Take 1 tablet (40 mg total) by mouth daily.   sildenafil (VIAGRA) 100 MG tablet, Take 1 tablet (100 mg total) by mouth daily as needed for erectile dysfunction.  Current Outpatient Medications (Respiratory):    Fluticasone-Umeclidin-Vilant (TRELEGY ELLIPTA) 100-62.5-25 MCG/INH AEPB, Inhale 1 puff into the lungs daily.   ipratropium (ATROVENT) 0.06 % nasal spray, Place 1 spray into both nostrils 2 (two) times daily.   montelukast (SINGULAIR) 10 MG tablet, TAKE 1 TABLET BY MOUTH EVERY DAY  Current Outpatient Medications (Analgesics):    traMADol (ULTRAM) 50 MG tablet, Take 1 tablet (50 mg total) by mouth 2 (two) times daily.  Current Outpatient Medications (Hematological):    vitamin B-12 (CYANOCOBALAMIN) 100 MCG tablet, Take 50 mcg by mouth daily.  Current Outpatient Medications (Other):    cholecalciferol (VITAMIN D3) 25 MCG (1000 UT) tablet, Take 1,000 Units by mouth 2 (two) times daily.   clonazePAM (KLONOPIN) 0.5 MG  tablet, TAKE 1 TABLET (0.5 MG TOTAL) BY MOUTH AT BEDTIME AS NEEDED FOR ANXIETY.   esomeprazole (NEXIUM) 20 MG capsule, Take 20 mg by mouth daily at 12 noon.   Multiple Vitamins-Minerals (MULTIVITAMIN WITH MINERALS) tablet, Take 1 tablet by mouth daily.   NON FORMULARY, Terbinafine   Omega-3 Fatty Acids (FISH OIL) 1200 MG CAPS, Take by mouth.   peppermint oil liquid, by Does not apply route as needed.   Specialty Vitamins Products (MAGNESIUM, AMINO ACID CHELATE,) 133 MG tablet, Take 1 tablet by mouth 2 (two) times a week.   tiZANidine (ZANAFLEX) 4 MG tablet, Take 1 tablet (4 mg  total) by mouth every 6 (six) hours as needed for muscle spasms.   Turmeric 450 MG CAPS, Take by mouth.   Reviewed prior external information including notes and imaging from  primary care provider As well as notes that were available from care everywhere and other healthcare systems.  Past medical history, social, surgical and family history all reviewed in electronic medical record.  [name] pertanent information unless stated regarding to the chief complaint.   Review of Systems:  [name] headache, visual changes, nausea, vomiting, diarrhea, constipation, dizziness, abdominal pain, skin rash, fevers, chills, night sweats, weight loss, swollen lymph nodes, body aches, joint swelling, chest pain, shortness of breath, mood changes. POSITIVE muscle aches  Objective  Blood pressure (!) 140/102, pulse (!) 104, height '5\' 11"'$  (1.803 m), weight 207 lb (93.9 kg), SpO2 97 %.   General: [name] apparent distress alert and oriented x3 mood and affect normal, dressed appropriately.  HEENT: Pupils equal, extraocular movements intact  Respiratory: Patient's speak in full sentences and does not appear short of breath  Cardiovascular: [name] lower extremity edema, non tender, [name] erythema  Gait normal with good balance and coordination.  MSK: Neck exam shows the patient does have some loss of lordosis.  Patient does have good range of motion all  compared to patient's baseline.  Right hand exam shows the patient does have a trigger nodule noted at the thumb at the A2 pulley.  Tender to palpation noted.  [name] thenar eminence wasting noted. Grip strength seems to be 4-5 but symmetric bilaterally.  Procedure: Real-time Ultrasound Guided Injection of right thumb flexor tendon sheath Device: GE Logiq Q7 Ultrasound guided injection is preferred based studies that show increased duration, increased effect, greater accuracy, decreased procedural pain, increased response rate, and decreased cost with ultrasound guided versus blind injection.  Verbal informed consent obtained.  Time-out conducted.  Noted [name] overlying erythema, induration, or other signs of local infection.  Skin prepped in a sterile fashion.  Local anesthesia: Topical Ethyl chloride.  With sterile technique and under real time ultrasound guidance: With a 25-gauge half inch needle injected with 0.5 cc of 0.5% Marcaine and 0.5 cc of Kenalog 40 mg/mL. Completed without difficulty  Pain immediately improved suggesting accurate placement of the medication.  Advised to call if fevers/chills, erythema, induration, drainage, or persistent bleeding.  Impression: Technically successful ultrasound guided injection.     Impression and Recommendations:     The above documentation has been reviewed and is accurate and complete Lyndal Pulley, DO

## 2020-12-20 ENCOUNTER — Other Ambulatory Visit: Payer: Self-pay

## 2020-12-20 ENCOUNTER — Encounter: Payer: Self-pay | Admitting: Family Medicine

## 2020-12-20 ENCOUNTER — Ambulatory Visit: Payer: Self-pay

## 2020-12-20 ENCOUNTER — Ambulatory Visit (INDEPENDENT_AMBULATORY_CARE_PROVIDER_SITE_OTHER): Payer: 59 | Admitting: Family Medicine

## 2020-12-20 VITALS — BP 140/102 | HR 104 | Ht 71.0 in | Wt 207.0 lb

## 2020-12-20 DIAGNOSIS — M79644 Pain in right finger(s): Secondary | ICD-10-CM | POA: Diagnosis not present

## 2020-12-20 DIAGNOSIS — M65311 Trigger thumb, right thumb: Secondary | ICD-10-CM | POA: Diagnosis not present

## 2020-12-20 DIAGNOSIS — M5010 Cervical disc disorder with radiculopathy, unspecified cervical region: Secondary | ICD-10-CM

## 2020-12-20 NOTE — Assessment & Plan Note (Signed)
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  Patient though did have difficulty getting approval for the third epidural and is concerned that this point he can be in so much pain.  Patient is looking for potential some other long-term improvement and would consider potential surgical intervention if this would help more.  At this point I would like to refer him to neurosurgery to discuss the potential treatment options.  Discussed icing regimen and home exercises.  Follow-up with me again in 3 months otherwise.

## 2020-12-20 NOTE — Assessment & Plan Note (Signed)
Patient given injection and tolerated the procedure well.  Discussed icing regimen and home exercises.  Increase activity slowly.  Discussed icing regimen and home exercises that I think will be beneficial and potentially bracing.  Worsening pain can always consider another injection but hopefully will not be necessary.

## 2020-12-20 NOTE — Patient Instructions (Signed)
Neurosurgery to discuss neck Injected thumb Stay active See me in 3 months

## 2021-01-10 ENCOUNTER — Other Ambulatory Visit: Payer: Self-pay | Admitting: Internal Medicine

## 2021-01-10 DIAGNOSIS — E291 Testicular hypofunction: Secondary | ICD-10-CM

## 2021-01-10 MED ORDER — XYOSTED 75 MG/0.5ML ~~LOC~~ SOAJ: 75.0000 mg | SUBCUTANEOUS | 0 refills | Status: DC

## 2021-01-12 ENCOUNTER — Encounter: Payer: Self-pay | Admitting: Internal Medicine

## 2021-02-02 ENCOUNTER — Other Ambulatory Visit: Payer: Self-pay | Admitting: Internal Medicine

## 2021-02-02 ENCOUNTER — Other Ambulatory Visit: Payer: Self-pay

## 2021-02-02 ENCOUNTER — Encounter: Payer: Self-pay | Admitting: Family Medicine

## 2021-02-02 DIAGNOSIS — E291 Testicular hypofunction: Secondary | ICD-10-CM

## 2021-02-02 DIAGNOSIS — M5412 Radiculopathy, cervical region: Secondary | ICD-10-CM

## 2021-02-02 MED ORDER — METHYLPREDNISOLONE 4 MG PO TBPK: ORAL_TABLET | ORAL | 0 refills | Status: DC

## 2021-02-03 ENCOUNTER — Other Ambulatory Visit: Payer: Self-pay | Admitting: Family Medicine

## 2021-02-03 MED ORDER — METHYLPREDNISOLONE 4 MG PO TBPK: ORAL_TABLET | ORAL | 0 refills | Status: DC

## 2021-02-11 ENCOUNTER — Other Ambulatory Visit: Payer: Self-pay | Admitting: Family Medicine

## 2021-02-11 ENCOUNTER — Telehealth: Payer: Self-pay | Admitting: Family Medicine

## 2021-02-11 MED ORDER — METHYLPREDNISOLONE 4 MG PO TBPK: ORAL_TABLET | ORAL | 0 refills | Status: DC

## 2021-02-11 NOTE — Telephone Encounter (Signed)
[phone]) confused by the Medrol Dosepak we rx'd today. Normal dosepak is 6,5,4,3,2,1 with a total of 21 pills.  Per our instructions she would be dispensing 11 total pills with different instructions. She would like to confirm this request.

## 2021-02-14 NOTE — Telephone Encounter (Signed)
Called pharmacy, confirmed filled as per Smith's instructions.

## 2021-02-18 ENCOUNTER — Other Ambulatory Visit: Payer: Self-pay

## 2021-02-18 ENCOUNTER — Ambulatory Visit
Admission: RE | Admit: 2021-02-18 | Discharge: 2021-02-18 | Disposition: A | Discharge status: Home / Self Care | Payer: 59 | Source: Ambulatory Visit | Attending: Family Medicine | Admitting: Family Medicine

## 2021-02-18 DIAGNOSIS — M5412 Radiculopathy, cervical region: Secondary | ICD-10-CM

## 2021-02-18 MED ORDER — IOPAMIDOL (ISOVUE-M 300) INJECTION 61%
1.0000 mL | Freq: Once | INTRAMUSCULAR | Status: AC | PRN
  Administered 2021-02-18: 1 mL via EPIDURAL

## 2021-02-18 MED ORDER — TRIAMCINOLONE ACETONIDE 40 MG/ML IJ SUSP (RADIOLOGY)
60.0000 mg | Freq: Once | INTRAMUSCULAR | Status: AC
  Administered 2021-02-18: 60 mg via EPIDURAL

## 2021-02-18 NOTE — Discharge Instructions (Signed)

## 2021-02-25 ENCOUNTER — Other Ambulatory Visit: Payer: Self-pay | Admitting: Internal Medicine

## 2021-02-25 DIAGNOSIS — I10 Essential (primary) hypertension: Secondary | ICD-10-CM

## 2021-03-11 NOTE — Progress Notes (Signed)
John [name] Phone: [phone] Subjective:   I, John [name], am serving as a Education administrator for Doctor Charlann Boxer   I'm seeing this patient by the request  of:  Janith Lima, MD  CC: Neck pain  JME:[id number]  12/20/2020 Patient given injection and tolerated the procedure well.  Discussed icing regimen and home exercises.  Increase activity slowly.  Discussed icing regimen and home exercises that I think will be beneficial and potentially bracing.  Worsening pain can always consider another injection but hopefully will not be necessary.  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  Patient though did have difficulty getting approval for the third epidural and is concerned that this point he can be in so much pain.  Patient is looking for potential some other long-term improvement and would consider potential surgical intervention if this would help more.  At this point I would like to refer him to neurosurgery to discuss the potential treatment options.  Discussed icing regimen and home exercises.  Follow-up with me again in 3 months otherwise.  Update 03/14/2021 John [name] is a 65 y.o. male coming in with complaint of R thumb trigger finger and cervical radiculopathy. Epidural 02/18/2021. Patient states that his neck is still giving him pain. Epidural didn't work  as well probably because he is putting a lot of stress landscaping on it still has numbness down the right arm no pain. Injection worked well on trigger finger   Patient's MRI of the cervical spine shows the patient does have degenerative disc disease with multiple nerve impingement with canal narrowing.    Past Medical History:  Diagnosis Date   Afib (HCC)    Anxiety and depression    Asthma    Emphysema of lung (HCC)    GERD (gastroesophageal reflux disease)    Hernia    right  inguinal   History of bronchitis    Hyperlipidemia    Hypertension    Morton's neuroma    OA (osteoarthritis)    neck   Obstructive sleep apnea on CPAP    Pancreatitis    Torn meniscus    Left   Past Surgical History:  Procedure Laterality Date   BONE CYST EXCISION  1991   left wrist   COLONOSCOPY     HERNIA REPAIR  04/14/11   right inguinal   HERNIA REPAIR     left inguinal, december 2020   Social History   Socioeconomic History   Marital status: Divorced    Spouse name: Not on file   Number of children: Not on file   Years of education: Not on file   Highest education level: Not on file  Occupational History   Not on file  Tobacco Use   Smoking status: Former    Packs/day: 1.50    Years: 38.00    Pack years: 57.00    Types: Cigarettes    Quit date: 2015    Years since quitting: 7.9   Smokeless tobacco: Never  Vaping Use   Vaping Use: Every day  Substance and Sexual Activity   Alcohol use: Not Currently   Drug use: No   Sexual activity: Yes    Partners: Female  Other Topics Concern   Not on file  Social History Narrative   Not on file   Social Determinants of Health   Financial Resource Strain: Not on  file  Food Insecurity: Not on file  Transportation Needs: Not on file  Physical Activity: Not on file  Stress: Not on file  Social Connections: Not on file   No Known Allergies Family History  Problem Relation Age of Onset   Hypertension Mother    Dementia Other    Hypertension Sister    Hypertension Brother     Current Outpatient Medications (Endocrine & Metabolic):    methylPREDNISolone (MEDROL DOSEPAK) 4 MG TBPK tablet, Take 4 pills day 1, 3 pills day 2, 2 pills day 3 1 pill day for 4 1/2 pill day 5   XYOSTED 75 MG/0.5ML SOAJ, INJECT 75MG  SUBCUTANEOUSLY ONCE WEEKLY AS DIRECTED  Current Outpatient Medications (Cardiovascular):    diltiazem (CARDIZEM CD) 120 MG 24 hr capsule, Take 1 capsule (120 mg total) by mouth daily.   dronedarone (MULTAQ)  400 MG tablet, TAKE 1 TABLET BY MOUTH 2 TIMES DAILY AS NEEDED (BREAKTHROUGH AFIB)   indapamide (LOZOL) 1.25 MG tablet, Take 1 tablet (1.25 mg total) by mouth daily.   olmesartan (BENICAR) 40 MG tablet, TAKE 1 TABLET BY MOUTH EVERY DAY   sildenafil (VIAGRA) 100 MG tablet, Take 1 tablet (100 mg total) by mouth daily as needed for erectile dysfunction.  Current Outpatient Medications (Respiratory):    Fluticasone-Umeclidin-Vilant (TRELEGY ELLIPTA) 100-62.5-25 MCG/INH AEPB, Inhale 1 puff into the lungs daily.   ipratropium (ATROVENT) 0.06 % nasal spray, Place 1 spray into both nostrils 2 (two) times daily.   montelukast (SINGULAIR) 10 MG tablet, TAKE 1 TABLET BY MOUTH EVERY DAY  Current Outpatient Medications (Analgesics):    traMADol (ULTRAM) 50 MG tablet, Take 1 tablet (50 mg total) by mouth 2 (two) times daily.  Current Outpatient Medications (Hematological):    vitamin B-12 (CYANOCOBALAMIN) 100 MCG tablet, Take 50 mcg by mouth daily.  Current Outpatient Medications (Other):    cholecalciferol (VITAMIN D3) 25 MCG (1000 UT) tablet, Take 1,000 Units by mouth 2 (two) times daily.   clonazePAM (KLONOPIN) 0.5 MG tablet, TAKE 1 TABLET (0.5 MG TOTAL) BY MOUTH AT BEDTIME AS NEEDED FOR ANXIETY.   esomeprazole (NEXIUM) 20 MG capsule, Take 20 mg by mouth daily at 12 noon.   Multiple Vitamins-Minerals (MULTIVITAMIN WITH MINERALS) tablet, Take 1 tablet by mouth daily.   NON FORMULARY, Terbinafine   Omega-3 Fatty Acids (FISH OIL) 1200 MG CAPS, Take by mouth.   peppermint oil liquid, by Does not apply route as needed.   Specialty Vitamins Products (MAGNESIUM, AMINO ACID CHELATE,) 133 MG tablet, Take 1 tablet by mouth 2 (two) times a week.   tiZANidine (ZANAFLEX) 4 MG tablet, Take 1 tablet (4 mg total) by mouth every 6 (six) hours as needed for muscle spasms.   Turmeric 450 MG CAPS, Take by mouth.   Reviewed prior external information including notes and imaging from  primary care provider As well as  notes that were available from care everywhere and other healthcare systems.  Past medical history, social, surgical and family history all reviewed in electronic medical record.  No pertanent information unless stated regarding to the chief complaint.   Review of Systems:  No headache, visual changes, nausea, vomiting, diarrhea, constipation, dizziness, abdominal pain, skin rash, fevers, chills, night sweats, weight loss, swollen lymph nodes, body aches, joint swelling, chest pain, shortness of breath, mood changes. POSITIVE muscle aches  Objective  Blood pressure (!) 142/88, pulse 96, height 5\' 11"  (1.803 m), weight 204 lb (92.5 kg), SpO2 100 %.   General: No apparent distress alert and  oriented x3 mood and affect normal, dressed appropriately.  HEENT: Pupils equal, extraocular movements intact  Respiratory: Patient's speak in full sentences and does not appear short of breath  Cardiovascular: No lower extremity edema, non tender, no erythema  Gait normal with good balance and coordination.  MSK: Patient is sitting comfortably.  Patient does have some limited range of motion of the Noted.  Deferred rest of the exam with patient feeling good.    Impression and Recommendations:     The above documentation has been reviewed and is accurate and complete Lyndal Pulley, DO

## 2021-03-14 ENCOUNTER — Ambulatory Visit (INDEPENDENT_AMBULATORY_CARE_PROVIDER_SITE_OTHER): Payer: 59 | Admitting: Family Medicine

## 2021-03-14 ENCOUNTER — Other Ambulatory Visit: Payer: Self-pay

## 2021-03-14 DIAGNOSIS — M5010 Cervical disc disorder with radiculopathy, unspecified cervical region: Secondary | ICD-10-CM

## 2021-03-14 NOTE — Patient Instructions (Addendum)
Great to see you  Glad you're feeling somewhat better Try to do stretches when you can Careful with wet leaves Send message first of year and we can consider epidural Trying to limit epidural and steroids as much as we can is better See you again when you need me

## 2021-03-14 NOTE — Assessment & Plan Note (Signed)
Continue difficulty but did respond again to an epidural.  We did discuss with patient about the increase in the steroid use the injections as well as orally and the potential side effects of them.  Patient overall feels that these are the only things that have been beneficial for him over the course of time.  We discussed with patient that if we needed to we will consider repeating the epidurals.  Patient is likely going to schedule surgical intervention of the neck next December.  Follow-up with me as needed otherwise.  Total time reviewing patient's imaging, discussing with patient about different treatment options, greater than 31 minutes

## 2021-03-21 ENCOUNTER — Ambulatory Visit: Payer: 59 | Admitting: Family Medicine

## 2021-03-27 ENCOUNTER — Other Ambulatory Visit: Payer: Self-pay | Admitting: Internal Medicine

## 2021-03-27 DIAGNOSIS — M5010 Cervical disc disorder with radiculopathy, unspecified cervical region: Secondary | ICD-10-CM

## 2021-03-27 DIAGNOSIS — M5440 Lumbago with sciatica, unspecified side: Secondary | ICD-10-CM

## 2021-03-29 ENCOUNTER — Encounter: Payer: Self-pay | Admitting: Family Medicine

## 2021-03-29 ENCOUNTER — Other Ambulatory Visit: Payer: Self-pay

## 2021-03-29 ENCOUNTER — Other Ambulatory Visit: Payer: Self-pay | Admitting: Family Medicine

## 2021-03-29 MED ORDER — METHYLPREDNISOLONE 4 MG PO TBPK: ORAL_TABLET | ORAL | 0 refills | Status: DC

## 2021-04-13 ENCOUNTER — Other Ambulatory Visit: Payer: Self-pay | Admitting: Internal Medicine

## 2021-04-13 DIAGNOSIS — E291 Testicular hypofunction: Secondary | ICD-10-CM

## 2021-04-15 ENCOUNTER — Other Ambulatory Visit: Payer: Self-pay

## 2021-04-15 DIAGNOSIS — M5412 Radiculopathy, cervical region: Secondary | ICD-10-CM

## 2021-04-21 ENCOUNTER — Ambulatory Visit
Admission: RE | Admit: 2021-04-21 | Discharge: 2021-04-21 | Disposition: A | Discharge status: Home / Self Care | Payer: Managed Care, Other (non HMO) | Source: Ambulatory Visit | Attending: Family Medicine | Admitting: Family Medicine

## 2021-04-21 DIAGNOSIS — M5412 Radiculopathy, cervical region: Secondary | ICD-10-CM

## 2021-04-21 MED ORDER — IOPAMIDOL (ISOVUE-M 300) INJECTION 61%
1.0000 mL | Freq: Once | INTRAMUSCULAR | Status: AC
  Administered 2021-04-21: 1 mL via EPIDURAL

## 2021-04-21 MED ORDER — TRIAMCINOLONE ACETONIDE 40 MG/ML IJ SUSP (RADIOLOGY)
60.0000 mg | Freq: Once | INTRAMUSCULAR | Status: AC
  Administered 2021-04-21: 60 mg via EPIDURAL

## 2021-04-21 NOTE — Discharge Instructions (Signed)

## 2021-04-29 ENCOUNTER — Telehealth: Payer: Self-pay | Admitting: Internal Medicine

## 2021-04-29 NOTE — Telephone Encounter (Signed)
[facility] calling in  Needs most recent OV notes for patient   Please fax to [phone]

## 2021-04-29 NOTE — Telephone Encounter (Signed)
11/25/20 OV note faxed

## 2021-05-06 ENCOUNTER — Other Ambulatory Visit: Payer: Self-pay | Admitting: Family Medicine

## 2021-05-09 ENCOUNTER — Encounter: Payer: Self-pay | Admitting: Internal Medicine

## 2021-05-09 ENCOUNTER — Telehealth: Payer: Self-pay | Admitting: Internal Medicine

## 2021-05-09 ENCOUNTER — Other Ambulatory Visit: Payer: Self-pay | Admitting: Internal Medicine

## 2021-05-09 DIAGNOSIS — E291 Testicular hypofunction: Secondary | ICD-10-CM

## 2021-05-09 MED ORDER — XYOSTED 75 MG/0.5ML ~~LOC~~ SOAJ: SUBCUTANEOUS | 0 refills | Status: DC

## 2021-05-09 NOTE — Telephone Encounter (Signed)
Key: [id number]  Per CoverMyMeds: PA has already submitted and is in process for this patient and drug. [id number]

## 2021-05-09 NOTE — Telephone Encounter (Signed)
Needing Xyosted. States insurance told him to contact office to expedite process of getting medication.      Callback #- [phone]

## 2021-05-10 ENCOUNTER — Other Ambulatory Visit: Payer: Self-pay | Admitting: Internal Medicine

## 2021-05-10 DIAGNOSIS — E291 Testicular hypofunction: Secondary | ICD-10-CM

## 2021-05-10 MED ORDER — TESTOSTERONE 50 MG/5GM (1%) TD GEL: 5.0000 g | Freq: Every day | TRANSDERMAL | 0 refills | Status: DC

## 2021-05-16 ENCOUNTER — Other Ambulatory Visit: Payer: Self-pay

## 2021-05-16 ENCOUNTER — Ambulatory Visit (INDEPENDENT_AMBULATORY_CARE_PROVIDER_SITE_OTHER): Payer: Managed Care, Other (non HMO) | Admitting: Internal Medicine

## 2021-05-16 ENCOUNTER — Encounter: Payer: Self-pay | Admitting: Internal Medicine

## 2021-05-16 VITALS — BP 158/98 | HR 90 | Temp 98.4°F | Resp 16 | Ht 71.0 in | Wt 214.0 lb

## 2021-05-16 DIAGNOSIS — Z0001 Encounter for general adult medical examination with abnormal findings: Secondary | ICD-10-CM | POA: Diagnosis not present

## 2021-05-16 DIAGNOSIS — I480 Paroxysmal atrial fibrillation: Secondary | ICD-10-CM

## 2021-05-16 DIAGNOSIS — N5201 Erectile dysfunction due to arterial insufficiency: Secondary | ICD-10-CM | POA: Diagnosis not present

## 2021-05-16 DIAGNOSIS — G8929 Other chronic pain: Secondary | ICD-10-CM

## 2021-05-16 DIAGNOSIS — I10 Essential (primary) hypertension: Secondary | ICD-10-CM | POA: Diagnosis not present

## 2021-05-16 DIAGNOSIS — Z1211 Encounter for screening for malignant neoplasm of colon: Secondary | ICD-10-CM | POA: Insufficient documentation

## 2021-05-16 DIAGNOSIS — K219 Gastro-esophageal reflux disease without esophagitis: Secondary | ICD-10-CM

## 2021-05-16 DIAGNOSIS — M5010 Cervical disc disorder with radiculopathy, unspecified cervical region: Secondary | ICD-10-CM

## 2021-05-16 DIAGNOSIS — E785 Hyperlipidemia, unspecified: Secondary | ICD-10-CM | POA: Diagnosis not present

## 2021-05-16 DIAGNOSIS — G4733 Obstructive sleep apnea (adult) (pediatric): Secondary | ICD-10-CM

## 2021-05-16 DIAGNOSIS — M5440 Lumbago with sciatica, unspecified side: Secondary | ICD-10-CM

## 2021-05-16 LAB — BASIC METABOLIC PANEL
BUN: 18 mg/dL (ref 6–23)
CO2: 30 mEq/L (ref 19–32)
Calcium: 9.2 mg/dL (ref 8.4–10.5)
Chloride: 102 mEq/L (ref 96–112)
Creatinine, Ser: 1.05 mg/dL (ref 0.40–1.50)
GFR: 75.29 mL/min (ref >60.00)
Glucose, Bld: 104 mg/dL — ABNORMAL HIGH (ref 70–99)
Potassium: 4.4 mEq/L (ref 3.5–5.1)
Sodium: 138 mEq/L (ref 135–145)

## 2021-05-16 LAB — CBC WITH DIFFERENTIAL/PLATELET
Basophils Absolute: 0 10*3/uL (ref 0.0–0.1)
Basophils Relative: 0.5 % (ref 0.0–3.0)
Eosinophils Absolute: 0.2 10*3/uL (ref 0.0–0.7)
Eosinophils Relative: 3.1 % (ref 0.0–5.0)
HCT: 44.2 % (ref 39.0–52.0)
Hemoglobin: 14.1 g/dL (ref 13.0–17.0)
Lymphocytes Relative: 18.8 % (ref 12.0–46.0)
Lymphs Abs: 1 10*3/uL (ref 0.7–4.0)
MCHC: 32 g/dL (ref 30.0–36.0)
MCV: 83.2 fl (ref 78.0–100.0)
Monocytes Absolute: 0.7 10*3/uL (ref 0.1–1.0)
Monocytes Relative: 12.2 % — ABNORMAL HIGH (ref 3.0–12.0)
Neutro Abs: 3.5 10*3/uL (ref 1.4–7.7)
Neutrophils Relative %: 65.4 % (ref 43.0–77.0)
Platelets: 162 10*3/uL (ref 150.0–400.0)
RBC: 5.31 Mil/uL (ref 4.22–5.81)
RDW: 15.8 % — ABNORMAL HIGH (ref 11.5–15.5)
WBC: 5.3 10*3/uL (ref 4.0–10.5)

## 2021-05-16 LAB — URINALYSIS, ROUTINE W REFLEX MICROSCOPIC
Bilirubin Urine: NEGATIVE
Hgb urine dipstick: NEGATIVE
Ketones, ur: NEGATIVE
Leukocytes,Ua: NEGATIVE
Nitrite: NEGATIVE
RBC / HPF: NONE SEEN (ref 0–?)
Specific Gravity, Urine: 1.015 (ref 1.000–1.030)
Total Protein, Urine: NEGATIVE
Urine Glucose: NEGATIVE
Urobilinogen, UA: 0.2 (ref 0.0–1.0)
pH: 7.5 (ref 5.0–8.0)

## 2021-05-16 LAB — HEPATIC FUNCTION PANEL
ALT: 29 U/L (ref 0–53)
AST: 21 U/L (ref 0–37)
Albumin: 4.3 g/dL (ref 3.5–5.2)
Alkaline Phosphatase: 55 U/L (ref 39–117)
Bilirubin, Direct: 0.1 mg/dL (ref 0.0–0.3)
Total Bilirubin: 0.7 mg/dL (ref 0.2–1.2)
Total Protein: 6.8 g/dL (ref 6.0–8.3)

## 2021-05-16 LAB — LIPID PANEL
Cholesterol: 225 mg/dL — ABNORMAL HIGH (ref 0–200)
HDL: 35.2 mg/dL — ABNORMAL LOW (ref >39.00)
LDL Cholesterol: 164 mg/dL — ABNORMAL HIGH (ref 0–99)
NonHDL: 189.76
Total CHOL/HDL Ratio: 6
Triglycerides: 127 mg/dL (ref 0.0–149.0)
VLDL: 25.4 mg/dL (ref 0.0–40.0)

## 2021-05-16 LAB — TSH: TSH: 1.12 u[IU]/mL (ref 0.35–5.50)

## 2021-05-16 LAB — PSA: PSA: 1.35 ng/mL (ref 0.10–4.00)

## 2021-05-16 MED ORDER — SILDENAFIL CITRATE 100 MG PO TABS: 100.0000 mg | ORAL_TABLET | Freq: Every day | ORAL | 4 refills | Status: DC | PRN

## 2021-05-16 MED ORDER — ESOMEPRAZOLE MAGNESIUM 20 MG PO CPDR: 20.0000 mg | DELAYED_RELEASE_CAPSULE | Freq: Every day | ORAL | 1 refills | Status: DC

## 2021-05-16 MED ORDER — ROSUVASTATIN CALCIUM 20 MG PO TABS: 20.0000 mg | ORAL_TABLET | Freq: Every day | ORAL | 1 refills | Status: DC

## 2021-05-16 MED ORDER — CARVEDILOL 3.125 MG PO TABS: 3.1250 mg | ORAL_TABLET | Freq: Two times a day (BID) | ORAL | 0 refills | Status: DC

## 2021-05-16 MED ORDER — MELOXICAM 7.5 MG PO TABS
7.5000 mg | ORAL_TABLET | Freq: Every day | ORAL | 0 refills | Status: DC
Start: 2021-05-16 — End: 2021-05-31

## 2021-05-16 NOTE — Patient Instructions (Signed)

## 2021-05-16 NOTE — Progress Notes (Signed)
Subjective:  Patient ID: John [name], male    DOB: 1956/02/09  Age: 66 y.o. MRN: [id number]  CC: Annual Exam, Hypertension, and Atrial Fibrillation  This visit occurred during the SARS-CoV-2 public health emergency.  Safety protocols were in place, including screening questions prior to the visit, additional usage of staff PPE, and extensive cleaning of exam room while observing appropriate contact time as indicated for disinfecting solutions.    HPI John [name] presents for a CPX and f/up -   He is not taking indapamide because he was concerned about side effects.  He has had a few headaches recently.  He is active and denies chest pain, shortness of breath, diaphoresis, dizziness, lightheadedness, or edema.  He complains of chronic musculoskeletal pain and wants a prescription for meloxicam.  Outpatient Medications Prior to Visit  Medication Sig Dispense Refill   cholecalciferol (VITAMIN D3) 25 MCG (1000 UT) tablet Take 1,000 Units by mouth 2 (two) times daily.     diltiazem (CARDIZEM CD) 120 MG 24 hr capsule Take 1 capsule (120 mg total) by mouth daily. 90 capsule 1   Fluticasone-Umeclidin-Vilant (TRELEGY ELLIPTA) 100-62.5-25 MCG/INH AEPB Inhale 1 puff into the lungs daily. 180 each 1   ipratropium (ATROVENT) 0.06 % nasal spray Place 1 spray into both nostrils 2 (two) times daily. 90 mL 1   montelukast (SINGULAIR) 10 MG tablet TAKE 1 TABLET BY MOUTH EVERY DAY 90 tablet 0   Multiple Vitamins-Minerals (MULTIVITAMIN WITH MINERALS) tablet Take 1 tablet by mouth daily.     olmesartan (BENICAR) 40 MG tablet TAKE 1 TABLET BY MOUTH EVERY DAY 90 tablet 0   Omega-3 Fatty Acids (FISH OIL) 1200 MG CAPS Take by mouth.     peppermint oil liquid by Does not apply route as needed.     Specialty Vitamins Products (MAGNESIUM, AMINO ACID CHELATE,) 133 MG tablet Take 1 tablet by mouth 2 (two) times a week.     testosterone (ANDROGEL) 50 MG/5GM (1%) GEL Place 5 g onto the skin daily. 450 g 0   tiZANidine  (ZANAFLEX) 4 MG tablet Take 1 tablet (4 mg total) by mouth every 6 (six) hours as needed for muscle spasms. 30 tablet 0   traMADol (ULTRAM) 50 MG tablet TAKE 1 TABLET BY MOUTH TWICE A DAY 60 tablet 3   Turmeric 450 MG CAPS Take by mouth.     vitamin B-12 (CYANOCOBALAMIN) 100 MCG tablet Take 50 mcg by mouth daily.     clonazePAM (KLONOPIN) 0.5 MG tablet TAKE 1 TABLET (0.5 MG TOTAL) BY MOUTH AT BEDTIME AS NEEDED FOR ANXIETY. 30 tablet 5   esomeprazole (NEXIUM) 20 MG capsule Take 20 mg by mouth daily at 12 noon.     Nutritional Supplements (PROSTA VITE PO) Take by mouth.     sildenafil (VIAGRA) 100 MG tablet Take 1 tablet (100 mg total) by mouth daily as needed for erectile dysfunction. 20 tablet 4   dronedarone (MULTAQ) 400 MG tablet TAKE 1 TABLET BY MOUTH 2 TIMES DAILY AS NEEDED (BREAKTHROUGH AFIB) (Patient not taking: Reported on 05/16/2021) 36 tablet 0   indapamide (LOZOL) 1.25 MG tablet Take 1 tablet (1.25 mg total) by mouth daily. (Patient not taking: Reported on 05/16/2021) 90 tablet 0   NON FORMULARY Terbinafine (Patient not taking: Reported on 05/16/2021)     Turmeric 400 MG CAPS      No facility-administered medications prior to visit.    ROS Review of Systems  Constitutional:  Positive for unexpected weight change (  wt gain). Negative for chills, diaphoresis and fatigue.  HENT: Negative.    Eyes: Negative.  Negative for visual disturbance.  Respiratory:  Positive for apnea. Negative for cough, shortness of breath and wheezing.   Genitourinary: Negative.  Negative for difficulty urinating, dysuria, hematuria, scrotal swelling and testicular pain.  Musculoskeletal:  Positive for arthralgias, back pain and neck pain. Negative for joint swelling and myalgias.  Neurological:  Positive for headaches. Negative for dizziness, tremors, seizures, weakness, light-headedness and numbness.  Hematological:  Negative for adenopathy. Does not bruise/bleed easily.  Psychiatric/Behavioral: Negative.      Objective:  BP (!) 158/98 (BP Location: Left Arm, Patient Position: Sitting, Cuff Size: Large)    Pulse 90    Temp 98.4 F (36.9 C) (Oral)    Resp 16    Ht 5\' 11"  (1.803 m)    Wt 214 lb (97.1 kg)    SpO2 100%    BMI 29.85 kg/m   BP Readings from Last 3 Encounters:  05/16/21 (!) 158/98  04/21/21 (!) 175/91  03/14/21 (!) 142/88    Wt Readings from Last 3 Encounters:  05/16/21 214 lb (97.1 kg)  03/14/21 204 lb (92.5 kg)  12/20/20 207 lb (93.9 kg)    Physical Exam Vitals reviewed.  Constitutional:      Appearance: He is not ill-appearing.  HENT:     Nose: Nose normal.     Mouth/Throat:     Mouth: Mucous membranes are moist.  Eyes:     General: No scleral icterus.    Conjunctiva/sclera: Conjunctivae normal.  Cardiovascular:     Rate and Rhythm: Normal rate and regular rhythm.     Heart sounds: No murmur heard.    Comments: EKG- NSR, 92 bpm ??LAE No LVH or Q waves Pulmonary:     Effort: Pulmonary effort is normal.     Breath sounds: No stridor. No wheezing, rhonchi or rales.  Abdominal:     General: Abdomen is flat.     Palpations: There is no mass.     Tenderness: There is no abdominal tenderness. There is no guarding or rebound.     Hernia: No hernia is present. There is no hernia in the left inguinal area or right inguinal area.  Genitourinary:    Pubic Area: No rash.      Penis: Normal and circumcised.      Testes: Normal.        Right: Mass, tenderness or swelling not present.        Left: Mass, tenderness or swelling not present.     Epididymis:     Right: Normal.     Left: Normal.     Prostate: Normal. Not enlarged, not tender and no nodules present.     Rectum: Normal. Guaiac result negative. No mass, tenderness, anal fissure, external hemorrhoid or internal hemorrhoid. Normal anal tone.  Musculoskeletal:        General: Normal range of motion.     Cervical back: Neck supple.     Right lower leg: No edema.     Left lower leg: No edema.   Lymphadenopathy:     Cervical: No cervical adenopathy.     Lower Body: No right inguinal adenopathy. No left inguinal adenopathy.  Skin:    General: Skin is warm and dry.  Neurological:     General: No focal deficit present.     Mental Status: He is alert.  Psychiatric:        Mood and Affect: Mood normal.  Behavior: Behavior normal.    Lab Results  Component Value Date   WBC 5.3 05/16/2021   HGB 14.1 05/16/2021   HCT 44.2 05/16/2021   PLT 162.0 05/16/2021   GLUCOSE 104 (H) 05/16/2021   CHOL 225 (H) 05/16/2021   TRIG 127.0 05/16/2021   HDL 35.20 (L) 05/16/2021   LDLCALC 164 (H) 05/16/2021   ALT 29 05/16/2021   AST 21 05/16/2021   NA 138 05/16/2021   K 4.4 05/16/2021   CL 102 05/16/2021   CREATININE 1.05 05/16/2021   BUN 18 05/16/2021   CO2 30 05/16/2021   TSH 1.12 05/16/2021   PSA 1.35 05/16/2021   HGBA1C 5.7 05/06/2020    DG INJECT DIAG/THERA/INC NEEDLE/CATH/PLC EPI/CERV/THOR W/IMG  Result Date: 04/21/2021 CLINICAL DATA:  Cervical spondylosis without myelopathy. Recurrent neck pain, right shoulder pain, and right upper extremity numbness/burning. Positive response to multiple prior epidural injections though with a shorter duration of relief following the most recent injection in 02/2021. FLUOROSCOPY TIME:  Fluoroscopy Time: 21 seconds Radiation Exposure Index: 15.65 microGray*m^2 PROCEDURE: The procedure, risks, benefits, and alternatives were explained to the patient. Questions regarding the procedure were encouraged and answered. The patient understands and consents to the procedure. CERVICAL EPIDURAL INJECTION An interlaminar approach was performed on the right at C7-T1. A 3.5 inch 20 gauge epidural needle was advanced using loss-of-resistance technique. DIAGNOSTIC EPIDURAL INJECTION Injection of Isovue-M 300 shows a good epidural pattern with spread above and below the level of needle placement bilaterally. No vascular opacification is seen. THERAPEUTIC EPIDURAL  INJECTION 1.5 ml of Kenalog 40 mixed with 2 ml of normal saline were then instilled. The procedure was well-tolerated, and the patient was discharged thirty minutes following the injection in good condition. IMPRESSION: Technically successful interlaminar epidural injection on the right at C7-T1. Electronically Signed   By: Logan Bores M.D.   On: 04/21/2021 10:48    Assessment & Plan:   Milad was seen today for annual exam, hypertension and atrial fibrillation.  Diagnoses and all orders for this visit:  Primary hypertension- His blood pressure is not adequately well controlled and he is symptomatic.  He is not willing to take a diuretic.  His EKG is reassuring.  Will add carvedilol to his current regimen. -     Basic metabolic panel; Future -     Urinalysis, Routine w reflex microscopic; Future -     CBC with Differential/Platelet; Future -     EKG 12-Lead -     carvedilol (COREG) 3.125 MG tablet; Take 1 tablet (3.125 mg total) by mouth 2 (two) times daily with a meal. -     CBC with Differential/Platelet -     Urinalysis, Routine w reflex microscopic -     Basic metabolic panel  PAF (paroxysmal atrial fibrillation) ([place])- He has good rate and rhythm control. -     TSH; Future -     TSH  Hyperlipidemia LDL goal <130- He has an elevated ASCVD risk score so I asked him to take a statin for CV risk reduction. -     Hepatic function panel; Future -     TSH; Future -     TSH -     Hepatic function panel -     rosuvastatin (CRESTOR) 20 MG tablet; Take 1 tablet (20 mg total) by mouth daily.  Encounter for general adult medical examination with abnormal findings- Exam completed, labs reviewed, vaccines reviewed; he refused vaccines against influenza and shingles, cancer screenings are up-to-date, patient  education was given. -     Lipid panel; Future -     PSA; Future -     PSA -     Lipid panel  Erectile dysfunction due to arterial insufficiency -     sildenafil (VIAGRA) 100 MG  tablet; Take 1 tablet (100 mg total) by mouth daily as needed for erectile dysfunction.  OSA (obstructive sleep apnea) -     Ambulatory referral to Sleep Studies  Gastroesophageal reflux disease without esophagitis -     esomeprazole (NEXIUM) 20 MG capsule; Take 1 capsule (20 mg total) by mouth daily at 12 noon.  Cervical disc disorder with radiculopathy of cervical region -     meloxicam (MOBIC) 7.5 MG tablet; Take 1 tablet (7.5 mg total) by mouth daily.  Chronic low back pain with sciatica, sciatica laterality unspecified, unspecified back pain laterality -     meloxicam (MOBIC) 7.5 MG tablet; Take 1 tablet (7.5 mg total) by mouth daily.   I have discontinued John Arvie. [name] "Steve"'s NON FORMULARY, clonazePAM, indapamide, and Nutritional Supplements (PROSTA VITE PO). I have also changed his esomeprazole. Additionally, I am having him start on carvedilol, meloxicam, and rosuvastatin. Lastly, I am having him maintain his multivitamin with minerals, vitamin B-12, magnesium (amino acid chelate), Fish Oil, cholecalciferol, Turmeric, peppermint oil, tiZANidine, Multaq, diltiazem, ipratropium, Trelegy Ellipta, olmesartan, traMADol, montelukast, testosterone, and sildenafil.  Meds ordered this encounter  Medications   sildenafil (VIAGRA) 100 MG tablet    Sig: Take 1 tablet (100 mg total) by mouth daily as needed for erectile dysfunction.    Dispense:  20 tablet    Refill:  4   carvedilol (COREG) 3.125 MG tablet    Sig: Take 1 tablet (3.125 mg total) by mouth 2 (two) times daily with a meal.    Dispense:  180 tablet    Refill:  0   esomeprazole (NEXIUM) 20 MG capsule    Sig: Take 1 capsule (20 mg total) by mouth daily at 12 noon.    Dispense:  90 capsule    Refill:  1   meloxicam (MOBIC) 7.5 MG tablet    Sig: Take 1 tablet (7.5 mg total) by mouth daily.    Dispense:  90 tablet    Refill:  0   rosuvastatin (CRESTOR) 20 MG tablet    Sig: Take 1 tablet (20 mg total) by mouth daily.     Dispense:  90 tablet    Refill:  1     Follow-up: Return in about 3 months (around 08/13/2021).  Scarlette Calico, MD

## 2021-05-17 ENCOUNTER — Encounter: Payer: Self-pay | Admitting: Internal Medicine

## 2021-05-17 NOTE — Progress Notes (Signed)
[facility] [place] Phone: [phone] Subjective:   John [name], am serving as a scribe for Dr. Hulan [name].This visit occurred during the SARS-CoV-2 public health emergency.  Safety protocols were in place, including screening questions prior to the visit, additional usage of staff PPE, and extensive cleaning of exam room while observing appropriate contact time as indicated for disinfecting solutions.  I'm seeing this patient by the request  of:  John Lima, MD  CC: neck pain    [id number]  03/14/2021 Continue difficulty but did respond again to an epidural.  We did discuss with patient about the increase in the steroid use the injections as well as orally and the potential side effects of them.  Patient overall feels that these are the only things that have been beneficial for him over the course of time.  We discussed with patient that if we needed to we will consider repeating the epidurals.  Patient is likely going to schedule surgical intervention of the neck next December.  Follow-up with me as needed otherwise.  Total time reviewing patient's imaging, discussing with patient about different treatment options, greater than 31 minutes  Update 05/19/2021 John [name] is a 66 y.o. male coming in with complaint of cervical spine pain. Epidural 04/21/2021. Patient states that this injection has helped. Was able to put out pine straw and do some trimming without pain. Had numbness at night recently but this went away 5 min.     Past Medical History:  Diagnosis Date   Afib (HCC)    Anxiety and depression    Asthma    Emphysema of lung (HCC)    GERD (gastroesophageal reflux disease)    Hernia    right inguinal   History of bronchitis    Hyperlipidemia    Hypertension    Morton's neuroma    OA (osteoarthritis)    neck   Obstructive sleep apnea on CPAP    Pancreatitis    Torn meniscus    Left   Past  Surgical History:  Procedure Laterality Date   BONE CYST EXCISION  1991   left wrist   COLONOSCOPY     HERNIA REPAIR  04/14/11   right inguinal   HERNIA REPAIR     left inguinal, december 2020   Social History   Socioeconomic History   Marital status: Divorced    Spouse name: Not on file   Number of children: Not on file   Years of education: Not on file   Highest education level: Not on file  Occupational History   Not on file  Tobacco Use   Smoking status: Former    Packs/day: 1.50    Years: 38.00    Pack years: 57.00    Types: Cigarettes    Quit date: 2015    Years since quitting: 8.1   Smokeless tobacco: Never  Vaping Use   Vaping Use: Every day  Substance and Sexual Activity   Alcohol use: Not Currently   Drug use: [name]   Sexual activity: Yes    Partners: Female  Other Topics Concern   Not on file  Social History Narrative   Not on file   Social Determinants of Health   Financial Resource Strain: Not on file  Food Insecurity: Not on file  Transportation Needs: Not on file  Physical Activity: Not on file  Stress: Not on file  Social Connections: Not on file   [name]  Known Allergies Family History  Problem Relation Age of Onset   Hypertension Mother    Dementia Other    Hypertension Sister    Hypertension Brother     Current Outpatient Medications (Endocrine & Metabolic):    testosterone (ANDROGEL) 50 MG/5GM (1%) GEL, Place 5 g onto the skin daily.  Current Outpatient Medications (Cardiovascular):    carvedilol (COREG) 3.125 MG tablet, Take 1 tablet (3.125 mg total) by mouth 2 (two) times daily with a meal.   diltiazem (CARDIZEM CD) 120 MG 24 hr capsule, Take 1 capsule (120 mg total) by mouth daily.   dronedarone (MULTAQ) 400 MG tablet, TAKE 1 TABLET BY MOUTH 2 TIMES DAILY AS NEEDED (BREAKTHROUGH AFIB)   olmesartan (BENICAR) 40 MG tablet, TAKE 1 TABLET BY MOUTH EVERY DAY   rosuvastatin (CRESTOR) 20 MG tablet, Take 1 tablet (20 mg total) by mouth daily.    sildenafil (VIAGRA) 100 MG tablet, Take 1 tablet (100 mg total) by mouth daily as needed for erectile dysfunction.  Current Outpatient Medications (Respiratory):    Fluticasone-Umeclidin-Vilant (TRELEGY ELLIPTA) 100-62.5-25 MCG/INH AEPB, Inhale 1 puff into the lungs daily.   ipratropium (ATROVENT) 0.06 % nasal spray, Place 1 spray into both nostrils 2 (two) times daily.   montelukast (SINGULAIR) 10 MG tablet, TAKE 1 TABLET BY MOUTH EVERY DAY  Current Outpatient Medications (Analgesics):    meloxicam (MOBIC) 7.5 MG tablet, Take 1 tablet (7.5 mg total) by mouth daily.   traMADol (ULTRAM) 50 MG tablet, TAKE 1 TABLET BY MOUTH TWICE A DAY  Current Outpatient Medications (Hematological):    vitamin B-12 (CYANOCOBALAMIN) 100 MCG tablet, Take 50 mcg by mouth daily.  Current Outpatient Medications (Other):    cholecalciferol (VITAMIN D3) 25 MCG (1000 UT) tablet, Take 1,000 Units by mouth 2 (two) times daily.   esomeprazole (NEXIUM) 20 MG capsule, Take 1 capsule (20 mg total) by mouth daily at 12 noon.   Multiple Vitamins-Minerals (MULTIVITAMIN WITH MINERALS) tablet, Take 1 tablet by mouth daily.   Omega-3 Fatty Acids (FISH OIL) 1200 MG CAPS, Take by mouth.   peppermint oil liquid, by Does not apply route as needed.   Specialty Vitamins Products (MAGNESIUM, AMINO ACID CHELATE,) 133 MG tablet, Take 1 tablet by mouth 2 (two) times a week.   tiZANidine (ZANAFLEX) 4 MG tablet, Take 1 tablet (4 mg total) by mouth every 6 (six) hours as needed for muscle spasms.   Turmeric 450 MG CAPS, Take by mouth.   Reviewed prior external information including notes and imaging from  primary care provider As well as notes that were available from care everywhere and other healthcare systems.  Past medical history, social, surgical and family history all reviewed in electronic medical record.  [name] pertanent information unless stated regarding to the chief complaint.   Review of Systems:  [name] headache, visual  changes, nausea, vomiting, diarrhea, constipation, dizziness, abdominal pain, skin rash, fevers, chills, night sweats, weight loss, swollen lymph nodes, body aches, joint swelling, chest pain, shortness of breath, mood changes. POSITIVE muscle aches  Objective  Blood pressure (!) 154/86, pulse 99, height 5\' 11"  (1.803 m), weight 215 lb (97.5 kg), SpO2 97 %.   General: [name] apparent distress alert and oriented x3 mood and affect normal, dressed appropriately.  HEENT: Pupils equal, extraocular movements intact  Respiratory: Patient's speak in full sentences and does not appear short of breath  Cardiovascular: [name] lower extremity edema, non tender, [name] erythema  Gait normal with good balance and coordination.  MSK: Neck exam  does have some loss lordosis still.  Patient has negative Spurling's.  5 out of 5 strength of the upper extremities. Low back does have some loss of lordosis.  Tenderness to palpation in the paraspinal musculature bilaterally.  Nothing radiates down the leg.    Impression and Recommendations:     The above documentation has been reviewed and is accurate and complete John Pulley, DO

## 2021-05-17 NOTE — Telephone Encounter (Signed)
Please advise. Thank you

## 2021-05-19 ENCOUNTER — Ambulatory Visit (INDEPENDENT_AMBULATORY_CARE_PROVIDER_SITE_OTHER): Payer: Managed Care, Other (non HMO) | Admitting: Family Medicine

## 2021-05-19 ENCOUNTER — Other Ambulatory Visit: Payer: Self-pay

## 2021-05-19 DIAGNOSIS — G8929 Other chronic pain: Secondary | ICD-10-CM | POA: Diagnosis not present

## 2021-05-19 DIAGNOSIS — M5440 Lumbago with sciatica, unspecified side: Secondary | ICD-10-CM | POA: Diagnosis not present

## 2021-05-19 DIAGNOSIS — M5010 Cervical disc disorder with radiculopathy, unspecified cervical region: Secondary | ICD-10-CM

## 2021-05-19 NOTE — Assessment & Plan Note (Signed)
We discussed patient's back pain and monitoring at length.  We discussed with patient we need to continue to monitor.  Patient was put on meloxicam by primary care provider.  Patient notices that the 7.5 does not seem to be as good as the 15 mg.  We discussed secondary to his heart would consider continuing the 7.5 with very intermittent doubling.  Patient does have Zanaflex for breakthrough pain I think could be better.  Follow-up with me again in 6 to 8 weeks.  Total time with patient Reviewing patient's imagings and previous notes greater than 31 minutes

## 2021-05-19 NOTE — Patient Instructions (Signed)
LBP exercises Continue Meloxicam  Send MyChart message in 1 month

## 2021-05-19 NOTE — Assessment & Plan Note (Signed)
Significant improvement now with the injections.  Patient seems to be doing better.  We discussed icing regimen and home exercises, discussed which activities to do and which ones to avoid.  Patient would like to still avoid surgical intervention throughout the season if possible.  Patient will follow-up with me again in 6 weeks otherwise.

## 2021-05-30 ENCOUNTER — Encounter: Payer: Self-pay | Admitting: Internal Medicine

## 2021-05-31 ENCOUNTER — Other Ambulatory Visit: Payer: Self-pay | Admitting: Internal Medicine

## 2021-05-31 DIAGNOSIS — M5440 Lumbago with sciatica, unspecified side: Secondary | ICD-10-CM

## 2021-05-31 DIAGNOSIS — M159 Polyosteoarthritis, unspecified: Secondary | ICD-10-CM

## 2021-05-31 DIAGNOSIS — G8929 Other chronic pain: Secondary | ICD-10-CM

## 2021-05-31 DIAGNOSIS — M5010 Cervical disc disorder with radiculopathy, unspecified cervical region: Secondary | ICD-10-CM

## 2021-05-31 DIAGNOSIS — M1990 Unspecified osteoarthritis, unspecified site: Secondary | ICD-10-CM | POA: Insufficient documentation

## 2021-05-31 MED ORDER — MELOXICAM 15 MG PO TABS: 15.0000 mg | ORAL_TABLET | Freq: Every day | ORAL | 0 refills | Status: DC

## 2021-06-01 ENCOUNTER — Other Ambulatory Visit: Payer: Self-pay | Admitting: Internal Medicine

## 2021-06-01 DIAGNOSIS — I10 Essential (primary) hypertension: Secondary | ICD-10-CM

## 2021-06-02 ENCOUNTER — Telehealth: Payer: Self-pay | Admitting: Pulmonary Disease

## 2021-06-02 MED ORDER — TRELEGY ELLIPTA 100-62.5-25 MCG/ACT IN AEPB: 1.0000 | INHALATION_SPRAY | Freq: Every day | RESPIRATORY_TRACT | 6 refills | Status: DC

## 2021-06-02 NOTE — Telephone Encounter (Signed)
Trelegy 100 sent to preferred pharmacy.  Patient is aware and voiced his understanding.  Nothing further needed.

## 2021-06-06 ENCOUNTER — Telehealth: Payer: Self-pay | Admitting: Pulmonary Disease

## 2021-06-06 NOTE — Telephone Encounter (Signed)
I called the patient and he wants to know if you have any other recommendations. He was told by his pharmacy that his Trelegy is no longer covered. He is going to call his insurance and see what is covered. Do you have any other recommendations  for an inhaler? Please advise.

## 2021-06-07 ENCOUNTER — Other Ambulatory Visit (HOSPITAL_COMMUNITY): Payer: Self-pay

## 2021-06-07 MED ORDER — FLUTICASONE FUROATE-VILANTEROL 100-25 MCG/ACT IN AEPB: 1.0000 | INHALATION_SPRAY | Freq: Every day | RESPIRATORY_TRACT | 11 refills | Status: DC

## 2021-06-07 NOTE — Telephone Encounter (Signed)
Dr. Valeta Harms, please clarify Breo dose? Thanks

## 2021-06-07 NOTE — Telephone Encounter (Signed)
Dr. Valeta Harms, please advise if okay to switch to [facility]? Thanks

## 2021-06-07 NOTE — Telephone Encounter (Signed)
Spoke to patient.  He stated that he was not able to get thru to insurance to obtain list of covered alternatives.   Pharmacy team, can you guys help with this?

## 2021-06-07 NOTE — Telephone Encounter (Signed)
Patient states was not able to get thru insurance company. Would like Dr. Juline Patch recommendations. Patient phone number is [phone].

## 2021-06-07 NOTE — Telephone Encounter (Signed)
Breo 100 sent to preferred pharmacy . Patient is aware and voiced his understanding.  Nothing further needed.

## 2021-06-09 NOTE — Telephone Encounter (Signed)
Received notification from [place] regarding a prior authorization for  Trelegy 100mg  . Authorization has been APPROVED from 06/09/21 to 06/09/22.  ? ? ?Authorization # (Key: [id number] ? ?If MD would like to switch back to triple therapy- it is now approved. ? ?Thanks!  ?

## 2021-06-09 NOTE — Telephone Encounter (Signed)
Dr. Valeta Harms, please advise.  ?Patient current taking Breo  ?

## 2021-06-13 NOTE — Telephone Encounter (Signed)
Lm for patient.  

## 2021-06-15 ENCOUNTER — Encounter: Payer: Self-pay | Admitting: Pulmonary Disease

## 2021-06-15 NOTE — Telephone Encounter (Signed)
Mychart message sent by pt: ?John Hopes "[name]"  P [facility] (supporting Icard, Bradley L, DO) 24 minutes ago (4:02 PM)  ? ?Hi Dr. Valeta Harms. I got a letter that [place] (my health insurance) approved the Trelegy at your request. However they approved it at 50% coverage so I would have to pay just under $350 a month, which I cannot do. The [facility] seems to be working OK so far. If anything changes I will make an appt or send message. I'm due back to see you in July.  ?Thank you! ? ? ? ?Routing to Dr. Valeta Harms as an [name]. ?

## 2021-06-28 ENCOUNTER — Encounter: Payer: Self-pay | Admitting: Family Medicine

## 2021-06-28 ENCOUNTER — Other Ambulatory Visit: Payer: Self-pay

## 2021-06-28 DIAGNOSIS — M5412 Radiculopathy, cervical region: Secondary | ICD-10-CM

## 2021-07-01 ENCOUNTER — Encounter: Payer: Self-pay | Admitting: Internal Medicine

## 2021-07-01 ENCOUNTER — Other Ambulatory Visit: Payer: Self-pay | Admitting: Internal Medicine

## 2021-07-01 DIAGNOSIS — I10 Essential (primary) hypertension: Secondary | ICD-10-CM

## 2021-07-01 MED ORDER — CARVEDILOL 3.125 MG PO TABS: 3.1250 mg | ORAL_TABLET | Freq: Two times a day (BID) | ORAL | 0 refills | Status: DC

## 2021-07-04 ENCOUNTER — Ambulatory Visit
Admission: RE | Admit: 2021-07-04 | Discharge: 2021-07-04 | Disposition: A | Discharge status: Home / Self Care | Payer: Managed Care, Other (non HMO) | Source: Ambulatory Visit | Attending: Family Medicine | Admitting: Family Medicine

## 2021-07-04 ENCOUNTER — Other Ambulatory Visit: Payer: Self-pay

## 2021-07-04 DIAGNOSIS — M5412 Radiculopathy, cervical region: Secondary | ICD-10-CM

## 2021-07-04 MED ORDER — IOPAMIDOL (ISOVUE-M 200) INJECTION 41%
1.0000 mL | Freq: Once | INTRAMUSCULAR | Status: AC
  Administered 2021-07-04: 1 mL via EPIDURAL

## 2021-07-04 MED ORDER — METHYLPREDNISOLONE ACETATE 40 MG/ML INJ SUSP (RADIOLOG
80.0000 mg | Freq: Once | INTRAMUSCULAR | Status: AC
  Administered 2021-07-04: 80 mg via EPIDURAL

## 2021-07-04 NOTE — Discharge Instructions (Addendum)

## 2021-07-11 ENCOUNTER — Other Ambulatory Visit: Payer: Self-pay | Admitting: Internal Medicine

## 2021-07-11 DIAGNOSIS — I480 Paroxysmal atrial fibrillation: Secondary | ICD-10-CM

## 2021-07-11 DIAGNOSIS — M5010 Cervical disc disorder with radiculopathy, unspecified cervical region: Secondary | ICD-10-CM

## 2021-07-11 DIAGNOSIS — J309 Allergic rhinitis, unspecified: Secondary | ICD-10-CM

## 2021-07-11 DIAGNOSIS — G8929 Other chronic pain: Secondary | ICD-10-CM

## 2021-07-11 DIAGNOSIS — M159 Polyosteoarthritis, unspecified: Secondary | ICD-10-CM

## 2021-07-11 DIAGNOSIS — I10 Essential (primary) hypertension: Secondary | ICD-10-CM

## 2021-07-11 MED ORDER — OLMESARTAN MEDOXOMIL 40 MG PO TABS: 40.0000 mg | ORAL_TABLET | Freq: Every day | ORAL | 0 refills | Status: DC

## 2021-07-11 MED ORDER — MELOXICAM 15 MG PO TABS: 15.0000 mg | ORAL_TABLET | Freq: Every day | ORAL | 0 refills | Status: DC

## 2021-07-11 MED ORDER — MONTELUKAST SODIUM 10 MG PO TABS: 10.0000 mg | ORAL_TABLET | Freq: Every day | ORAL | 0 refills | Status: DC

## 2021-07-11 MED ORDER — DILTIAZEM HCL ER COATED BEADS 120 MG PO CP24: 120.0000 mg | ORAL_CAPSULE | Freq: Every day | ORAL | 1 refills | Status: DC

## 2021-07-29 NOTE — Progress Notes (Signed)
?John [name] D.O. ?[facility] ?[place] ?Phone: [phone] ?Subjective:   ?I, John [name], am serving as a scribe for Dr. Hulan Saas. ? ?This visit occurred during the SARS-CoV-2 public health emergency.  Safety protocols were in place, including screening questions prior to the visit, additional usage of staff PPE, and extensive cleaning of exam room while observing appropriate contact time as indicated for disinfecting solutions.  ? ? ?I'm seeing this patient by the request  of:  Janith Lima, MD ? ?CC: Neck pain follow-up ? ?John [name]:[id number]  ?05/19/2021 ?We discussed patient's back pain and monitoring at length.  We discussed with patient we need to continue to monitor.  Patient was put on meloxicam by primary care provider.  Patient notices that the 7.5 does not seem to be as good as the 15 mg.  We discussed secondary to his heart would consider continuing the 7.5 with very intermittent doubling.  Patient does have Zanaflex for breakthrough pain I think could be better.  Follow-up with me again in 6 to 8 weeks.  Total time with patient ?Reviewing patient's imagings and previous notes greater than 31 minutes ? ?Significant improvement now with the injections.  Patient seems to be doing better.  We discussed icing regimen and home exercises, discussed which activities to do and which ones to avoid.  Patient would like to still avoid surgical intervention throughout the season if possible.  Patient will follow-up with me again in 6 weeks otherwise. ? ?Update 08/01/2021 ?John [name] is a 66 y.o. male coming in with complaint of lumbar and cervical spine pain. Epidural 07/04/2021. Patient states that he has relief from last injection more than any of the injections. Does have some tingling and numbness in R arm but no pain. ?Still planning on having surgical intervention in December of this year. ? ? ? ?  ? ?Past Medical History:  ?Diagnosis Date  ? Afib ([place])   ?  Anxiety and depression   ? Asthma   ? Emphysema of lung ([place])   ? GERD (gastroesophageal reflux disease)   ? Hernia   ? right inguinal  ? History of bronchitis   ? Hyperlipidemia   ? Hypertension   ? Morton's neuroma   ? OA (osteoarthritis)   ? neck  ? Obstructive sleep apnea on CPAP   ? Pancreatitis   ? Torn meniscus   ? Left  ? ?Past Surgical History:  ?Procedure Laterality Date  ? BONE CYST EXCISION  1991  ? left wrist  ? COLONOSCOPY    ? HERNIA REPAIR  04/14/11  ? right inguinal  ? HERNIA REPAIR    ? left inguinal, december 2020  ? ?Social History  ? ?Socioeconomic History  ? Marital status: Divorced  ?  Spouse name: Not on file  ? Number of children: Not on file  ? Years of education: Not on file  ? Highest education level: Not on file  ?Occupational History  ? Not on file  ?Tobacco Use  ? Smoking status: Former  ?  Packs/day: 1.50  ?  Years: 38.00  ?  Pack years: 57.00  ?  Types: Cigarettes  ?  Quit date: 2015  ?  Years since quitting: 8.3  ? Smokeless tobacco: Never  ?Vaping Use  ? Vaping Use: Every day  ?Substance and Sexual Activity  ? Alcohol use: Not Currently  ? Drug use: No  ? Sexual activity: Yes  ?  Partners: Female  ?Other  Topics Concern  ? Not on file  ?Social History Narrative  ? Not on file  ? ?Social Determinants of Health  ? ?Financial Resource Strain: Not on file  ?Food Insecurity: Not on file  ?Transportation Needs: Not on file  ?Physical Activity: Not on file  ?Stress: Not on file  ?Social Connections: Not on file  ? ?No Known Allergies ?Family History  ?Problem Relation Age of Onset  ? Hypertension Mother   ? Dementia Other   ? Hypertension Sister   ? Hypertension Brother   ? ? ?Current Outpatient Medications (Endocrine & Metabolic):  ?  testosterone (ANDROGEL) 50 MG/5GM (1%) GEL, Place 5 g onto the skin daily. ? ?Current Outpatient Medications (Cardiovascular):  ?  carvedilol (COREG) 3.125 MG tablet, Take 1 tablet (3.125 mg total) by mouth 2 (two) times daily with a meal. ?  diltiazem  (CARDIZEM CD) 120 MG 24 hr capsule, Take 1 capsule (120 mg total) by mouth daily. ?  dronedarone (MULTAQ) 400 MG tablet, TAKE 1 TABLET BY MOUTH 2 TIMES DAILY AS NEEDED (BREAKTHROUGH AFIB) ?  olmesartan (BENICAR) 40 MG tablet, Take 1 tablet (40 mg total) by mouth daily. ?  rosuvastatin (CRESTOR) 20 MG tablet, Take 1 tablet (20 mg total) by mouth daily. ?  sildenafil (VIAGRA) 100 MG tablet, Take 1 tablet (100 mg total) by mouth daily as needed for erectile dysfunction. ? ?Current Outpatient Medications (Respiratory):  ?  fluticasone furoate-vilanterol (BREO ELLIPTA) 100-25 MCG/ACT AEPB, Inhale 1 puff into the lungs daily. ?  ipratropium (ATROVENT) 0.06 % nasal spray, Place 1 spray into both nostrils 2 (two) times daily. ?  montelukast (SINGULAIR) 10 MG tablet, Take 1 tablet (10 mg total) by mouth daily. ? ?Current Outpatient Medications (Analgesics):  ?  meloxicam (MOBIC) 15 MG tablet, Take 1 tablet (15 mg total) by mouth daily. ?  traMADol (ULTRAM) 50 MG tablet, TAKE 1 TABLET BY MOUTH TWICE A DAY ? ?Current Outpatient Medications (Hematological):  ?  vitamin B-12 (CYANOCOBALAMIN) 100 MCG tablet, Take 50 mcg by mouth daily. ? ?Current Outpatient Medications (Other):  ?  cholecalciferol (VITAMIN D3) 25 MCG (1000 UT) tablet, Take 1,000 Units by mouth 2 (two) times daily. ?  esomeprazole (NEXIUM) 20 MG capsule, Take 1 capsule (20 mg total) by mouth daily at 12 noon. ?  Multiple Vitamins-Minerals (MULTIVITAMIN WITH MINERALS) tablet, Take 1 tablet by mouth daily. ?  Omega-3 Fatty Acids (FISH OIL) 1200 MG CAPS, Take by mouth. ?  peppermint oil liquid, by Does not apply route as needed. ?  Specialty Vitamins Products (MAGNESIUM, AMINO ACID CHELATE,) 133 MG tablet, Take 1 tablet by mouth 2 (two) times a week. ?  tiZANidine (ZANAFLEX) 4 MG tablet, Take 1 tablet (4 mg total) by mouth every 6 (six) hours as needed for muscle spasms. ?  Turmeric 450 MG CAPS, Take by mouth. ? ? ? ?Review of Systems: ? No headache, visual changes,  nausea, vomiting, diarrhea, constipation, dizziness, abdominal pain, skin rash, fevers, chills, night sweats, weight loss, swollen lymph nodes, body aches, joint swelling, chest pain, shortness of breath, mood changes. POSITIVE muscle aches ? ?Objective  ?Blood pressure (!) 160/92, pulse 92, height '5\' 11"'$  (1.803 m), weight 217 lb (98.4 kg), SpO2 96 %. ?  ?General: No apparent distress alert and oriented x3 mood and affect normal, dressed appropriately.  ?HEENT: Pupils equal, extraocular movements intact  ?Respiratory: Patient's speak in full sentences and does not appear short of breath  ?Cardiovascular: No lower extremity edema, non tender, no  erythema  ?Gait normal with good balance and coordination.  ?MSK: Neck exam does have some loss of lordosis.  Patient does have still some radicular symptoms with Spurling's.  Patient does seem to be more comfortable than usual. ? ?  ?Impression and Recommendations:  ?  ? ?The above documentation has been reviewed and is accurate and complete Lyndal Pulley, DO ? ? ? ?

## 2021-08-01 ENCOUNTER — Encounter: Payer: Self-pay | Admitting: Family Medicine

## 2021-08-01 ENCOUNTER — Ambulatory Visit (INDEPENDENT_AMBULATORY_CARE_PROVIDER_SITE_OTHER): Payer: Managed Care, Other (non HMO) | Admitting: Family Medicine

## 2021-08-01 DIAGNOSIS — M5010 Cervical disc disorder with radiculopathy, unspecified cervical region: Secondary | ICD-10-CM

## 2021-08-01 NOTE — Assessment & Plan Note (Signed)
Patient responded extremely well to the cervical injection still.  Patient is under the timeline where patient will have the surgical intervention on the neck in December.  Patient needs to wait until he can have coverage for his dizziness and then he should be able to take care of himself.  Discussed icing regimen and home exercises, increase activity slowly.  Follow-up with me as needed and can write [place] when he needs repeat of the injection ?

## 2021-08-09 ENCOUNTER — Encounter: Payer: Self-pay | Admitting: Pulmonary Disease

## 2021-08-09 ENCOUNTER — Encounter: Payer: Self-pay | Admitting: Internal Medicine

## 2021-08-09 ENCOUNTER — Other Ambulatory Visit: Payer: Self-pay | Admitting: Internal Medicine

## 2021-08-09 DIAGNOSIS — E291 Testicular hypofunction: Secondary | ICD-10-CM

## 2021-08-09 DIAGNOSIS — J449 Chronic obstructive pulmonary disease, unspecified: Secondary | ICD-10-CM

## 2021-08-09 MED ORDER — TESTOSTERONE 50 MG/5GM (1%) TD GEL: 5.0000 g | Freq: Every day | TRANSDERMAL | 0 refills | Status: DC

## 2021-08-09 NOTE — Telephone Encounter (Signed)
Dr. Valeta Harms, please advise on pt's email. Pt is questioning if he needs a LAMA in addition to his Breo. Thanks.  ?

## 2021-08-12 NOTE — Telephone Encounter (Signed)
Received the following message from patient:  ? ?"Hi Doctor Icard you may recall several weeks ago [employer] denied my Trelegy RX and forced me to use Breo. I have experienced some worsening of my lungs but did not want to act too fast. I'm having incresed wheezing in the evenings and upon waking, and am slaboring for a full, deep breath more frequently. Am wondering if I might need that 3rd medication that was in Trelegy but not in [place]?  ?Also I have to make an appt to see you in June it will have been a year at that point but will message seperately to do that. Thanks ?**I use [facility] now on the [address] corner of [address]" ? ?Patient sent an additional message today:  ? ?"Did you guys forget me? Waiting on advice concerning meds. I'm definitely having intervals of shortness of breath. Thanks" ? ?I sent him a message asking if he remembered his insurance mentioning about Judithann Sauger being covered.  ? ?Judson Roch, can you please advise Dr. Valeta Harms is not available? Thanks!  ?

## 2021-08-15 MED ORDER — BREZTRI AEROSPHERE 160-9-4.8 MCG/ACT IN AERO: 2.0000 | INHALATION_SPRAY | Freq: Two times a day (BID) | RESPIRATORY_TRACT | 3 refills | Status: DC

## 2021-08-15 MED ORDER — BREZTRI AEROSPHERE 160-9-4.8 MCG/ACT IN AERO: 2.0000 | INHALATION_SPRAY | Freq: Two times a day (BID) | RESPIRATORY_TRACT | 0 refills | Status: DC

## 2021-08-15 NOTE — Telephone Encounter (Signed)
Order placed for Breztri and samples left behind check in desk. Pt notified. Nothing further needed at this time.  ?

## 2021-08-17 MED ORDER — BREZTRI AEROSPHERE 160-9-4.8 MCG/ACT IN AERO: 2.0000 | INHALATION_SPRAY | Freq: Two times a day (BID) | RESPIRATORY_TRACT | 3 refills | Status: DC

## 2021-08-17 NOTE — Addendum Note (Signed)
Addended by: Monna Fam L on: 08/17/2021 05:05 PM ? ? Modules accepted: Orders ? ?

## 2021-08-24 ENCOUNTER — Other Ambulatory Visit: Payer: Self-pay

## 2021-08-24 ENCOUNTER — Encounter: Payer: Self-pay | Admitting: Family Medicine

## 2021-08-24 DIAGNOSIS — M542 Cervicalgia: Secondary | ICD-10-CM

## 2021-08-24 MED ORDER — METHYLPREDNISOLONE 4 MG PO TBPK: ORAL_TABLET | ORAL | 0 refills | Status: DC

## 2021-09-02 MED ORDER — FLUTICASONE PROPIONATE HFA 110 MCG/ACT IN AERO: 2.0000 | INHALATION_SPRAY | Freq: Two times a day (BID) | RESPIRATORY_TRACT | 12 refills | Status: DC

## 2021-09-02 MED ORDER — ANORO ELLIPTA 62.5-25 MCG/ACT IN AEPB: 1.0000 | INHALATION_SPRAY | Freq: Every day | RESPIRATORY_TRACT | 3 refills | Status: DC

## 2021-09-02 NOTE — Addendum Note (Signed)
Addended by: Monna Fam L on: 09/02/2021 06:02 PM   Modules accepted: Orders

## 2021-09-06 ENCOUNTER — Ambulatory Visit
Admission: RE | Admit: 2021-09-06 | Discharge: 2021-09-06 | Disposition: A | Discharge status: Home / Self Care | Payer: Commercial Managed Care - HMO | Source: Ambulatory Visit | Attending: Family Medicine | Admitting: Family Medicine

## 2021-09-06 DIAGNOSIS — M542 Cervicalgia: Secondary | ICD-10-CM

## 2021-09-06 MED ORDER — IOPAMIDOL (ISOVUE-M 300) INJECTION 61%
1.0000 mL | Freq: Once | INTRAMUSCULAR | Status: AC | PRN
  Administered 2021-09-06: 1 mL via EPIDURAL

## 2021-09-06 MED ORDER — TRIAMCINOLONE ACETONIDE 40 MG/ML IJ SUSP (RADIOLOGY)
60.0000 mg | Freq: Once | INTRAMUSCULAR | Status: AC
  Administered 2021-09-06: 60 mg via EPIDURAL

## 2021-09-06 NOTE — Discharge Instructions (Signed)

## 2021-09-06 NOTE — Telephone Encounter (Signed)
Dr. Valeta Harms, the pt prefers to stay on Breo with Anoro, instead of Anoro and Flovent as previously advised.   Please advise if ok to send in Anoro and if ok to stay on Breo. Thanks.

## 2021-09-19 ENCOUNTER — Telehealth: Payer: Self-pay | Admitting: Pharmacy Technician

## 2021-09-19 ENCOUNTER — Encounter: Payer: Self-pay | Admitting: Pulmonary Disease

## 2021-09-19 ENCOUNTER — Ambulatory Visit: Payer: Managed Care, Other (non HMO) | Admitting: Pulmonary Disease

## 2021-09-19 VITALS — BP 164/98 | HR 87 | Temp 98.9°F | Ht 71.0 in | Wt 208.8 lb

## 2021-09-19 DIAGNOSIS — Z87891 Personal history of nicotine dependence: Secondary | ICD-10-CM | POA: Diagnosis not present

## 2021-09-19 DIAGNOSIS — J449 Chronic obstructive pulmonary disease, unspecified: Secondary | ICD-10-CM

## 2021-09-19 DIAGNOSIS — Z720 Tobacco use: Secondary | ICD-10-CM | POA: Diagnosis not present

## 2021-09-19 DIAGNOSIS — U070 Vaping-related disorder: Secondary | ICD-10-CM | POA: Diagnosis not present

## 2021-09-19 MED ORDER — TRELEGY ELLIPTA 100-62.5-25 MCG/ACT IN AEPB: 1.0000 | INHALATION_SPRAY | Freq: Every day | RESPIRATORY_TRACT | 0 refills | Status: DC

## 2021-09-19 NOTE — Telephone Encounter (Signed)
-----   Message from [facility], [place] sent at 09/19/2021  9:36 AM EDT ----- Please advise if Trelegy or Judithann Sauger are covered by formulary?

## 2021-09-19 NOTE — Telephone Encounter (Signed)
-----   Message from [facility], [place] sent at 09/19/2021  9:36 AM EDT ----- Please advise if Trelegy or Judithann Sauger are covered by formulary?

## 2021-09-19 NOTE — Addendum Note (Signed)
Addended by: Fran Lowes on: 09/19/2021 05:09 PM   Modules accepted: Orders

## 2021-09-19 NOTE — Progress Notes (Signed)
Synopsis: Referred in May 2021 self-referral for COPD, PCP: By John Lima, MD  Subjective:   PATIENT ID: John [name] GENDER: male DOB: Apr 16, 1955, MRN: [id number]  Chief Complaint  Patient presents with   Follow-up    Follow up. Patient still having trouble with breathing.      This is a 66 year old gentleman past medical history of emphysema, asthma, atrial fibrillation, hypertension hyperlipidemia presents to pulmonary clinic for evaluation of COPD.  Patient is a former smoker.  He has not had any prior PFTs.  Previous echocardiogram in 2018 with normal ejection fraction 55 to 62%, grade 1 diastolic dysfunction.  OV 08/18/2019: back in march had URI, lingering cough for 6 weeks. Former smoker 38 years, quit 2015, replaced with vaping. He is still vaping at this point.  Overall he is improved since his exacerbation in March.  He has had previous exacerbations treated with steroids.  He does have previous events in which steroids were given to him he developed atrial fibrillation following this.  He is currently on Multaq.  He is not on anticoagulation.  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  He uses this multiple times a day.  When asked about how much he uses he was unwilling to disclose the exact amount of Afrin nasal spray he uses.  Does complain of fluid and pressure buildup in his ear.  OV 10/04/2020: Patient was referred for lung cancer screening last office visit.  He met with him and declined.  Seems to be very anxious about having or undergoing a screening program.  He is still vaping however.  He vapes several cartridges a day.  Been unable to quit at this time.  He is also not interested in quitting.  He did quit smoking tobacco cigarettes in 2015.  He was able to stop the use of Afrin.  His nasal symptoms and ear issues have resolved.  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  Former smoker.  At last office  visit was using Trelegy.  Difficulty obtaining this from pharmacy.  He has had multiple issues with insurance company on medications that are on his formulary.  Currently on Breo Ellipta.  Overall doing a little bit better.  Using [facility] but he still has occasional wheezing and breakthrough symptoms on Breo.  He did not have any of this when on Trelegy.  He benefited from being on triple therapy.  He is rather frustrated that he has not been able to obtain formulary information from his insurance provider.      Past Medical History:  Diagnosis Date   Afib ([place])    Anxiety and depression    Asthma    Emphysema of lung (HCC)    GERD (gastroesophageal reflux disease)    Hernia    right inguinal   History of bronchitis    Hyperlipidemia    Hypertension    Morton's neuroma    OA (osteoarthritis)    neck   Obstructive sleep apnea on CPAP    Pancreatitis    Torn meniscus    Left     Family History  Problem Relation Age of Onset   Hypertension Mother    Dementia Other    Hypertension Sister    Hypertension Brother      Past Surgical History:  Procedure Laterality Date   BONE CYST EXCISION  1991   left wrist   COLONOSCOPY     HERNIA REPAIR  04/14/11   right inguinal   HERNIA REPAIR     left inguinal, december 2020    Social History   Socioeconomic History   Marital status: Divorced    Spouse name: Not on file   Number of children: Not on file   Years of education: Not on file   Highest education level: Not on file  Occupational History   Not on file  Tobacco Use   Smoking status: Former    Packs/day: 1.50    Years: 38.00    Total pack years: 57.00    Types: Cigarettes    Quit date: 2015    Years since quitting: 8.4   Smokeless tobacco: Never  Vaping Use   Vaping Use: Every day  Substance and Sexual Activity   Alcohol use: Not Currently   Drug use: No   Sexual activity: Yes    Partners: Female  Other Topics Concern   Not on file  Social History Narrative    Not on file   Social Determinants of Health   Financial Resource Strain: Not on file  Food Insecurity: Not on file  Transportation Needs: Not on file  Physical Activity: Not on file  Stress: Not on file  Social Connections: Not on file  Intimate Partner Violence: Not on file     No Known Allergies   Outpatient Medications Prior to Visit  Medication Sig Dispense Refill   carvedilol (COREG) 3.125 MG tablet Take 1 tablet (3.125 mg total) by mouth 2 (two) times daily with a meal. 180 tablet 0   cholecalciferol (VITAMIN D3) 25 MCG (1000 UT) tablet Take 1,000 Units by mouth 2 (two) times daily.     diltiazem (CARDIZEM CD) 120 MG 24 hr capsule Take 1 capsule (120 mg total) by mouth daily. 90 capsule 1   esomeprazole (NEXIUM) 20 MG capsule Take 1 capsule (20 mg total) by mouth daily at 12 noon. 90 capsule 1   fluticasone (FLOVENT HFA) 110 MCG/ACT inhaler Inhale 2 puffs into the lungs 2 (two) times daily. 1 each 12   fluticasone furoate-vilanterol (BREO ELLIPTA) 100-25 MCG/ACT AEPB Inhale 1 puff into the lungs daily. 60 each 11   ipratropium (ATROVENT) 0.06 % nasal spray Place 1 spray into both nostrils 2 (two) times daily. 90 mL 1   meloxicam (MOBIC) 15 MG tablet Take 1 tablet (15 mg total) by mouth daily. 90 tablet 0   methylPREDNISolone (MEDROL DOSEPAK) 4 MG TBPK tablet Take 4 pills day 1, 3 pills day 2, 2 pills day 3 1 pill day for 4 1/2 pill day 5 (Patient taking differently: Take 4 pills day 1, 3 pills day 2, 2 pills day 3 1 pill day for 4 1/2 pill day 5 As needed) 21 tablet 0   montelukast (SINGULAIR) 10 MG tablet Take 1 tablet (10 mg total) by mouth daily. 90 tablet 0   Multiple Vitamins-Minerals (MULTIVITAMIN WITH MINERALS) tablet Take 1 tablet by mouth daily.     olmesartan (BENICAR) 40 MG tablet Take 1 tablet (40 mg total) by mouth daily. 90 tablet 0   Omega-3 Fatty Acids (FISH OIL) 1200 MG CAPS Take by mouth.     peppermint oil liquid by Does not apply route as needed.      rosuvastatin (CRESTOR) 20 MG tablet Take 1 tablet (20 mg total) by mouth daily. 90 tablet 1   sildenafil (VIAGRA) 100 MG tablet Take 1 tablet (100 mg total) by mouth daily as needed for erectile dysfunction. 20 tablet 4  Specialty Vitamins Products (MAGNESIUM, AMINO ACID CHELATE,) 133 MG tablet Take 1 tablet by mouth 2 (two) times a week.     testosterone (ANDROGEL) 50 MG/5GM (1%) GEL Place 5 g onto the skin daily. 450 g 0   tiZANidine (ZANAFLEX) 4 MG tablet Take 1 tablet (4 mg total) by mouth every 6 (six) hours as needed for muscle spasms. 30 tablet 0   traMADol (ULTRAM) 50 MG tablet TAKE 1 TABLET BY MOUTH TWICE A DAY 60 tablet 3   Turmeric 450 MG CAPS Take by mouth.     vitamin B-12 (CYANOCOBALAMIN) 100 MCG tablet Take 50 mcg by mouth daily.     Budeson-Glycopyrrol-Formoterol (BREZTRI AEROSPHERE) 160-9-4.8 MCG/ACT AERO Inhale 2 puffs into the lungs in the morning and at bedtime. (Patient not taking: Reported on 09/19/2021) 5.9 g 0   Budeson-Glycopyrrol-Formoterol (BREZTRI AEROSPHERE) 160-9-4.8 MCG/ACT AERO Inhale 2 puffs into the lungs in the morning and at bedtime. (Patient not taking: Reported on 09/19/2021) 10.7 g 3   dronedarone (MULTAQ) 400 MG tablet TAKE 1 TABLET BY MOUTH 2 TIMES DAILY AS NEEDED (BREAKTHROUGH AFIB) (Patient not taking: Reported on 09/19/2021) 36 tablet 0   umeclidinium-vilanterol (ANORO ELLIPTA) 62.5-25 MCG/ACT AEPB Inhale 1 puff into the lungs daily. (Patient not taking: Reported on 09/19/2021) 60 each 3   No facility-administered medications prior to visit.    Review of Systems  Constitutional:  Negative for chills, fever, malaise/fatigue and weight loss.  HENT:  Negative for hearing loss, sore throat and tinnitus.   Eyes:  Negative for blurred vision and double vision.  Respiratory:  Positive for cough and shortness of breath. Negative for hemoptysis, sputum production, wheezing and stridor.   Cardiovascular:  Negative for chest pain, palpitations, orthopnea, leg  swelling and PND.  Gastrointestinal:  Negative for abdominal pain, constipation, diarrhea, heartburn, nausea and vomiting.  Genitourinary:  Negative for dysuria, hematuria and urgency.  Musculoskeletal:  Negative for joint pain and myalgias.  Skin:  Negative for itching and rash.  Neurological:  Negative for dizziness, tingling, weakness and headaches.  Endo/Heme/Allergies:  Negative for environmental allergies. Does not bruise/bleed easily.  Psychiatric/Behavioral:  Negative for depression. The patient is not nervous/anxious and does not have insomnia.   All other systems reviewed and are negative.    Objective:  Physical Exam Vitals reviewed.  Constitutional:      General: He is not in acute distress.    Appearance: He is well-developed.  HENT:     Head: Normocephalic and atraumatic.  Eyes:     General: No scleral icterus.    Conjunctiva/sclera: Conjunctivae normal.     Pupils: Pupils are equal, round, and reactive to light.  Neck:     Vascular: No JVD.     Trachea: No tracheal deviation.  Cardiovascular:     Rate and Rhythm: Normal rate and regular rhythm.     Heart sounds: Normal heart sounds. No murmur heard. Pulmonary:     Effort: Pulmonary effort is normal. No tachypnea, accessory muscle usage or respiratory distress.     Breath sounds: No stridor. No wheezing, rhonchi or rales.  Abdominal:     General: There is no distension.     Palpations: Abdomen is soft.     Tenderness: There is no abdominal tenderness.  Musculoskeletal:        General: No tenderness.     Cervical back: Neck supple.  Lymphadenopathy:     Cervical: No cervical adenopathy.  Skin:    General: Skin is warm and dry.  Capillary Refill: Capillary refill takes less than 2 seconds.     Findings: No rash.  Neurological:     Mental Status: He is alert and oriented to person, place, and time.  Psychiatric:        Behavior: Behavior normal.      Vitals:   09/19/21 0927  BP: (!) 164/98   Pulse: 87  Temp: 98.9 F (37.2 C)  TempSrc: Oral  SpO2: 97%  Weight: 208 lb 12.8 oz (94.7 kg)  Height: 5' 11" (1.803 m)    97% on RA BMI Readings from Last 3 Encounters:  09/19/21 29.12 kg/m  08/01/21 30.27 kg/m  05/19/21 29.99 kg/m   Wt Readings from Last 3 Encounters:  09/19/21 208 lb 12.8 oz (94.7 kg)  08/01/21 217 lb (98.4 kg)  05/19/21 215 lb (97.5 kg)     CBC    Component Value Date/Time   WBC 5.3 05/16/2021 1202   RBC 5.31 05/16/2021 1202   HGB 14.1 05/16/2021 1202   HCT 44.2 05/16/2021 1202   PLT 162.0 05/16/2021 1202   MCV 83.2 05/16/2021 1202   MCH 29.9 05/02/2019 1432   MCHC 32.0 05/16/2021 1202   RDW 15.8 (H) 05/16/2021 1202   LYMPHSABS 1.0 05/16/2021 1202   MONOABS 0.7 05/16/2021 1202   EOSABS 0.2 05/16/2021 1202   BASOSABS 0.0 05/16/2021 1202     Chest Imaging: Chest x-ray 06/23/2019 2 view No acute abnormality, no infiltrate The patient's images have been independently reviewed by me.    Pulmonary Functions Testing Results:    Latest Ref Rng & Units 10/27/2019    9:02 AM  PFT Results  FVC-Pre L 3.44   FVC-Predicted Pre % 71   FVC-Post L 3.87   FVC-Predicted Post % 80   Pre FEV1/FVC % % 65   Post FEV1/FCV % % 65   FEV1-Pre L 2.23   FEV1-Predicted Pre % 61   FEV1-Post L 2.50   DLCO uncorrected ml/min/mmHg 27.14   DLCO UNC% % 97   DLCO corrected ml/min/mmHg 27.14   DLCO COR %Predicted % 97   DLVA Predicted % 102   TLC L 6.95   TLC % Predicted % 97   RV % Predicted % 142     FeNO: none   Pathology: none   Echocardiogram: 2018: normal EF, G1DF  Heart Catheterization: None     Assessment & Plan:      ICD-10-CM   1. Chronic obstructive pulmonary disease, unspecified COPD type ([place])  J44.9     2. COPD, moderate ([place])  J44.9     3. Asthma-COPD overlap syndrome ([place])  J44.9     4. Tobacco abuse  Z72.0     5. 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  He also last office visit was using Afrin pretty regularly.  From a COPD management standpoint has been doing well on inhaler regimen with Trelegy.  This had to be changed due to insurance requirements.  Now on Breo Ellipta.  He would like to go back to triple therapy inhaler regimen as he had less symptoms.  Plan: Samples today of Trelegy. Reach out to our pharmacy group to see if we can help obtain Trelegy either through prior authorization or through [place] financial aid application. We will need to figure out at least what is covered  by his [place]. Patient to follow-up with [place] in 1 year or as needed.  Was counseled last office visit on lung cancer screening and declined.  Return to clinic in 1 year or as needed.   Current Outpatient Medications:    carvedilol (COREG) 3.125 MG tablet, Take 1 tablet (3.125 mg total) by mouth 2 (two) times daily with a meal., Disp: 180 tablet, Rfl: 0   cholecalciferol (VITAMIN D3) 25 MCG (1000 UT) tablet, Take 1,000 Units by mouth 2 (two) times daily., Disp: , Rfl:    diltiazem (CARDIZEM CD) 120 MG 24 hr capsule, Take 1 capsule (120 mg total) by mouth daily., Disp: 90 capsule, Rfl: 1   esomeprazole (NEXIUM) 20 MG capsule, Take 1 capsule (20 mg total) by mouth daily at 12 noon., Disp: 90 capsule, Rfl: 1   fluticasone (FLOVENT HFA) 110 MCG/ACT inhaler, Inhale 2 puffs into the lungs 2 (two) times daily., Disp: 1 each, Rfl: 12   fluticasone furoate-vilanterol (BREO ELLIPTA) 100-25 MCG/ACT AEPB, Inhale 1 puff into the lungs daily., Disp: 60 each, Rfl: 11   ipratropium (ATROVENT) 0.06 % nasal spray, Place 1 spray into both nostrils 2 (two) times daily., Disp: 90 mL, Rfl: 1   meloxicam (MOBIC) 15 MG tablet, Take 1 tablet (15 mg total) by mouth daily., Disp: 90 tablet, Rfl: 0   methylPREDNISolone (MEDROL DOSEPAK) 4 MG TBPK tablet, Take 4 pills day 1, 3 pills day 2, 2 pills day 3 1 pill  day for 4 1/2 pill day 5 (Patient taking differently: Take 4 pills day 1, 3 pills day 2, 2 pills day 3 1 pill day for 4 1/2 pill day 5 As needed), Disp: 21 tablet, Rfl: 0   montelukast (SINGULAIR) 10 MG tablet, Take 1 tablet (10 mg total) by mouth daily., Disp: 90 tablet, Rfl: 0   Multiple Vitamins-Minerals (MULTIVITAMIN WITH MINERALS) tablet, Take 1 tablet by mouth daily., Disp: , Rfl:    olmesartan (BENICAR) 40 MG tablet, Take 1 tablet (40 mg total) by mouth daily., Disp: 90 tablet, Rfl: 0   Omega-3 Fatty Acids (FISH OIL) 1200 MG CAPS, Take by mouth., Disp: , Rfl:    peppermint oil liquid, by Does not apply route as needed., Disp: , Rfl:    rosuvastatin (CRESTOR) 20 MG tablet, Take 1 tablet (20 mg total) by mouth daily., Disp: 90 tablet, Rfl: 1   sildenafil (VIAGRA) 100 MG tablet, Take 1 tablet (100 mg total) by mouth daily as needed for erectile dysfunction., Disp: 20 tablet, Rfl: 4   Specialty Vitamins Products (MAGNESIUM, AMINO ACID CHELATE,) 133 MG tablet, Take 1 tablet by mouth 2 (two) times a week., Disp: , Rfl:    testosterone (ANDROGEL) 50 MG/5GM (1%) GEL, Place 5 g onto the skin daily., Disp: 450 g, Rfl: 0   tiZANidine (ZANAFLEX) 4 MG tablet, Take 1 tablet (4 mg total) by mouth every 6 (six) hours as needed for muscle spasms., Disp: 30 tablet, Rfl: 0   traMADol (ULTRAM) 50 MG tablet, TAKE 1 TABLET BY MOUTH TWICE A DAY, Disp: 60 tablet, Rfl: 3   Turmeric 450 MG CAPS, Take by mouth., Disp: , Rfl:    vitamin B-12 (CYANOCOBALAMIN) 100 MCG tablet, Take 50 mcg by mouth daily., Disp: , Rfl:    Budeson-Glycopyrrol-Formoterol (BREZTRI AEROSPHERE) 160-9-4.8 MCG/ACT AERO, Inhale 2 puffs into the lungs in the morning and at bedtime. (Patient not taking: Reported on 09/19/2021), Disp: 5.9 g, Rfl: 0   Budeson-Glycopyrrol-Formoterol (BREZTRI AEROSPHERE) 160-9-4.8 MCG/ACT AERO, Inhale 2  puffs into the lungs in the morning and at bedtime. (Patient not taking: Reported on 09/19/2021), Disp: 10.7 g, Rfl: 3    dronedarone (MULTAQ) 400 MG tablet, TAKE 1 TABLET BY MOUTH 2 TIMES DAILY AS NEEDED (BREAKTHROUGH AFIB) (Patient not taking: Reported on 09/19/2021), Disp: 36 tablet, Rfl: 0   umeclidinium-vilanterol (ANORO ELLIPTA) 62.5-25 MCG/ACT AEPB, Inhale 1 puff into the lungs daily. (Patient not taking: Reported on 09/19/2021), Disp: 60 each, Rfl: [place], DO [facility] 09/19/2021 9:40 AM

## 2021-09-19 NOTE — Telephone Encounter (Signed)
Incorrect context. Please see other encounter

## 2021-09-19 NOTE — Patient Instructions (Signed)
Thank you for visiting Dr. Valeta Harms at [facility]. Today we recommend the following:  Trelegy samples today  Work with pharmacy to get something with triple therapy approved.   Return in about 1 year (around 09/20/2022), or if symptoms worsen or fail to improve.    Please do your part to reduce the spread of COVID-19.

## 2021-09-20 ENCOUNTER — Other Ambulatory Visit (HOSPITAL_COMMUNITY): Payer: Self-pay

## 2021-09-21 ENCOUNTER — Encounter: Payer: Self-pay | Admitting: Pulmonary Disease

## 2021-09-23 NOTE — Telephone Encounter (Signed)
Per prior auth team, TRELEGY returns a copay of $299.79. Breztri not on formulary, would require a PA.   Office notes indicate patient is pursuing patient assistance for Trelegy at this time. CMA to f/u  Knox Saliva, PharmD, MPH, BCPS, CPP Clinical Pharmacist (Rheumatology and Pulmonology)

## 2021-09-30 ENCOUNTER — Other Ambulatory Visit: Payer: Self-pay | Admitting: Internal Medicine

## 2021-09-30 DIAGNOSIS — I10 Essential (primary) hypertension: Secondary | ICD-10-CM

## 2021-10-03 ENCOUNTER — Telehealth (HOSPITAL_COMMUNITY): Payer: Self-pay

## 2021-10-03 ENCOUNTER — Telehealth: Payer: Self-pay | Admitting: Pulmonary Disease

## 2021-10-03 MED ORDER — DILTIAZEM HCL 30 MG PO TABS: ORAL_TABLET | ORAL | 1 refills | Status: DC

## 2021-10-04 ENCOUNTER — Encounter: Payer: Self-pay | Admitting: Internal Medicine

## 2021-10-05 ENCOUNTER — Other Ambulatory Visit: Payer: Self-pay | Admitting: Internal Medicine

## 2021-10-05 DIAGNOSIS — M5010 Cervical disc disorder with radiculopathy, unspecified cervical region: Secondary | ICD-10-CM

## 2021-10-05 DIAGNOSIS — G8929 Other chronic pain: Secondary | ICD-10-CM

## 2021-10-05 MED ORDER — TRAMADOL HCL 50 MG PO TABS: 50.0000 mg | ORAL_TABLET | Freq: Two times a day (BID) | ORAL | 3 refills | Status: DC

## 2021-10-05 NOTE — Telephone Encounter (Signed)
Called [employer] to check on the status of the application. I was told by the representative that they do not have the application on file for patient and they have asked that we refax it.   John [name], can you locate these forms and refax them?

## 2021-10-06 ENCOUNTER — Other Ambulatory Visit: Payer: Self-pay | Admitting: Internal Medicine

## 2021-10-06 ENCOUNTER — Ambulatory Visit (HOSPITAL_COMMUNITY)
Admission: RE | Admit: 2021-10-06 | Discharge: 2021-10-06 | Disposition: A | Discharge status: Home / Self Care | Payer: Commercial Managed Care - HMO | Source: Ambulatory Visit | Attending: Nurse Practitioner | Admitting: Nurse Practitioner

## 2021-10-06 ENCOUNTER — Encounter (HOSPITAL_COMMUNITY): Payer: Self-pay | Admitting: Nurse Practitioner

## 2021-10-06 VITALS — BP 148/84 | HR 76 | Ht 71.0 in | Wt 205.0 lb

## 2021-10-06 DIAGNOSIS — G4733 Obstructive sleep apnea (adult) (pediatric): Secondary | ICD-10-CM | POA: Diagnosis not present

## 2021-10-06 DIAGNOSIS — Z8249 Family history of ischemic heart disease and other diseases of the circulatory system: Secondary | ICD-10-CM | POA: Diagnosis not present

## 2021-10-06 DIAGNOSIS — Z87891 Personal history of nicotine dependence: Secondary | ICD-10-CM | POA: Diagnosis not present

## 2021-10-06 DIAGNOSIS — Z791 Long term (current) use of non-steroidal anti-inflammatories (NSAID): Secondary | ICD-10-CM | POA: Diagnosis not present

## 2021-10-06 DIAGNOSIS — M5010 Cervical disc disorder with radiculopathy, unspecified cervical region: Secondary | ICD-10-CM

## 2021-10-06 DIAGNOSIS — E785 Hyperlipidemia, unspecified: Secondary | ICD-10-CM | POA: Insufficient documentation

## 2021-10-06 DIAGNOSIS — K219 Gastro-esophageal reflux disease without esophagitis: Secondary | ICD-10-CM | POA: Insufficient documentation

## 2021-10-06 DIAGNOSIS — F419 Anxiety disorder, unspecified: Secondary | ICD-10-CM | POA: Diagnosis not present

## 2021-10-06 DIAGNOSIS — G8929 Other chronic pain: Secondary | ICD-10-CM

## 2021-10-06 DIAGNOSIS — I480 Paroxysmal atrial fibrillation: Secondary | ICD-10-CM | POA: Diagnosis present

## 2021-10-06 DIAGNOSIS — F32A Depression, unspecified: Secondary | ICD-10-CM | POA: Diagnosis not present

## 2021-10-06 DIAGNOSIS — I10 Essential (primary) hypertension: Secondary | ICD-10-CM | POA: Diagnosis not present

## 2021-10-06 DIAGNOSIS — Z79899 Other long term (current) drug therapy: Secondary | ICD-10-CM | POA: Diagnosis not present

## 2021-10-06 MED ORDER — TRAMADOL HCL 50 MG PO TABS: 50.0000 mg | ORAL_TABLET | Freq: Two times a day (BID) | ORAL | 3 refills | Status: DC

## 2021-10-06 NOTE — Telephone Encounter (Signed)
Pt called in requesting a refill on traMADol (ULTRAM) 50 MG tablet. It was [facility] to [facility] but he wants it sent to  [facility], [place] [address] Phone:  [phone]  Fax:  [phone]

## 2021-10-06 NOTE — Progress Notes (Signed)
Primary Care Physician: Janith Lima, MD Referring Physician: [facility]   John [name] is a 66 y.o. male with a h/o anxiety and depression, HTN, obstructive sleep apnea, with new onset afib treated in [facility] ER 8/15, with RVR at 177 bpm. He had two doses of adenosine in EMS which did not change rhythm.   He did have successful cardioversion. He was drinking a lot of caffeine drinks,before ER visit, vapes with low nicotine option, is "addicted to Aftrin nose spray" and because of this may get some systemic effects from decongestants, has OSA but does not use cpap as he felt worsened his nasal congestion.  He works in yard care, owns his own business.  He has chronic back issues with herniated disc in 2014. He has to take meloxicam 15 mg daily as well as ultram daily to function in his physical job.He is concerned that he is now on eliquis 5 mg bid per cardioversion protocol. He has a chadsvasc score of 1,HTN, so he will not need anticoagulation long term by guidelines.   F/u in afib clinic, 08/20/17. Unfortunately, pt had return of afib with rvr. He required cardioversion.  He has had a flare of acute on chronic back pain and had taken a steroid taper, just finishing a couple of days before he had afib. He had gotten off daily Afrin  nose spray, but had uses around 3 weeks with the recent pollen issues and working outside. Marland Kitchen He was suppose to have a back injection tomorrow but will have to cancel. He was not started on anticoagulation after the cardioversion but by protocol will require anticoagulation x 4 weeks. He is aware that he will have to put off the back injection. He is very anxious re afib, " I never want to have another episode."  Struggled with anxiety for years.  F/u in afib clinic, 5/20. Pt was started on multaq 400 mg bid. He has not had any further afib. He feels well on Multaq.EKG does not show any qtc prolongation.  F/u with afib clinic, 6/24. He is staying in SR with Multaq and is  tolerating without any diarrhea or nausea.  F/u in afib clinic, 11/4. He is feeling well. He is staying in rhythm with Multaq. BP is elevated on presentation but states white coat hypertension.BP 's well managed per pt.   F/u in afib clinic, 04/30/19. I have not seen pt since 2019. Apparently  he was doing well and stopped the Multaq. He wishes to have a refill of Multaq as he uses it with prednisone  tapers which he usually has to have once a year for his lungs.He had afib past one of the tapers before and feels this is a trigger for him.  He recently had to have a steroid injection for his shoulder and finished the Multaq he had on hand, which is also outdayed. No afib burden to speak of. By his description, it sounds as thought he has PAC's at times   F/u in afib clinic. No afib to speak off. He describes what sounds like PC's last week. He is not on daily Multaq. He did take 30 mg diltiazem. He is not on DOAC by guidelines for CHA2DS2VASc  score of 1 but he will be 67 in February 2024 and will have meet guidelines for DOAC at that time. He does take meloxicam and tumeric daily for arthritic pains. We discussed this would make him at higher risk for bleeding with DOAC added  and he may want to consider a Watchman at that point.   Today, he denies symptoms of palpitations, chest pain, shortness of breath, orthopnea, PND, lower extremity edema, dizziness, presyncope, syncope, or neurologic sequela. The patient is tolerating medications without difficulties and is otherwise without complaint today.   Past Medical History:  Diagnosis Date   Afib ([place])    Anxiety and depression    Asthma    Emphysema of lung ([place])    GERD (gastroesophageal reflux disease)    Hernia    right inguinal   History of bronchitis    Hyperlipidemia    Hypertension    Morton's neuroma    OA (osteoarthritis)    neck   Obstructive sleep apnea on CPAP    Pancreatitis    Torn meniscus    Left   Past Surgical History:   Procedure Laterality Date   BONE CYST EXCISION  1991   left wrist   COLONOSCOPY     HERNIA REPAIR  04/14/11   right inguinal   HERNIA REPAIR     left inguinal, december 2020    Current Outpatient Medications  Medication Sig Dispense Refill   carvedilol (COREG) 3.125 MG tablet TAKE 1 TABLET(3.125 MG) BY MOUTH TWICE DAILY WITH A MEAL 180 tablet 0   Coenzyme Q10 (CO Q 10 PO) Take 400 mg by mouth every evening.     DHEA 25 MG CAPS Take 2 capsules by mouth every evening.     diltiazem (CARDIZEM CD) 120 MG 24 hr capsule Take 1 capsule (120 mg total) by mouth daily. 90 capsule 1   diltiazem (CARDIZEM) 30 MG tablet Take one tablet by mouth every 4 hours as needed, HR above 100 and top number B/P greater than 100 30 tablet 1   dronedarone (MULTAQ) 400 MG tablet TAKE 1 TABLET BY MOUTH 2 TIMES DAILY AS NEEDED (BREAKTHROUGH AFIB) 36 tablet 0   esomeprazole (NEXIUM) 20 MG capsule Take 1 capsule (20 mg total) by mouth daily at 12 noon. 90 capsule 1   Flaxseed, Linseed, (FLAX SEED OIL PO) Take 1 tablet by mouth every morning.     fluticasone (FLONASE) 50 MCG/ACT nasal spray Place 2 sprays into both nostrils daily.     fluticasone (FLOVENT HFA) 110 MCG/ACT inhaler Inhale 2 puffs into the lungs 2 (two) times daily. 1 each 12   Fluticasone-Umeclidin-Vilant (TRELEGY ELLIPTA) 100-62.5-25 MCG/ACT AEPB Inhale 1 puff into the lungs daily. 60 each 0   ipratropium (ATROVENT) 0.06 % nasal spray Place 1 spray into both nostrils 2 (two) times daily. 90 mL 1   meloxicam (MOBIC) 15 MG tablet Take 1 tablet (15 mg total) by mouth daily. 90 tablet 0   montelukast (SINGULAIR) 10 MG tablet Take 1 tablet (10 mg total) by mouth daily. 90 tablet 0   Multiple Vitamins-Minerals (MULTIVITAMIN WITH MINERALS) tablet Take 1 tablet by mouth daily.     olmesartan (BENICAR) 40 MG tablet Take 1 tablet (40 mg total) by mouth daily. 90 tablet 0   Omega-3 Fatty Acids (FISH OIL) 1200 MG CAPS Take 1 capsule by mouth in the morning and at  bedtime.     PEPPERMINT OIL PO Take 2 capsules by mouth every morning.     rosuvastatin (CRESTOR) 20 MG tablet Take 1 tablet (20 mg total) by mouth daily. 90 tablet 1   sildenafil (VIAGRA) 100 MG tablet Take 1 tablet (100 mg total) by mouth daily as needed for erectile dysfunction. 20 tablet 4   Specialty Vitamins  Products (MAGNESIUM, AMINO ACID CHELATE,) 133 MG tablet 1/2 tablet by mouth daily     testosterone (ANDROGEL) 50 MG/5GM (1%) GEL Place 5 g onto the skin daily. 450 g 0   tiZANidine (ZANAFLEX) 4 MG tablet Take 1 tablet (4 mg total) by mouth every 6 (six) hours as needed for muscle spasms. 30 tablet 0   traMADol (ULTRAM) 50 MG tablet Take 1 tablet (50 mg total) by mouth 2 (two) times daily. (Patient taking differently: Take 50 mg by mouth every morning.) 60 tablet 3   Turmeric 450 MG CAPS Take 1 capsule by mouth 2 (two) times daily.     vitamin B-12 (CYANOCOBALAMIN) 100 MCG tablet Take 50 mcg by mouth daily.     cholecalciferol (VITAMIN D3) 25 MCG (1000 UT) tablet Take 1,000 Units by mouth 2 (two) times daily. (Patient not taking: Reported on 10/06/2021)     No current facility-administered medications for this encounter.    No Known Allergies  Social History   Socioeconomic History   Marital status: Divorced    Spouse name: Not on file   Number of children: Not on file   Years of education: Not on file   Highest education level: Not on file  Occupational History   Not on file  Tobacco Use   Smoking status: Former    Packs/day: 1.50    Years: 38.00    Total pack years: 57.00    Types: Cigarettes    Quit date: 2015    Years since quitting: 8.4   Smokeless tobacco: Never  Vaping Use   Vaping Use: Every day  Substance and Sexual Activity   Alcohol use: Not Currently   Drug use: No   Sexual activity: Yes    Partners: Female  Other Topics Concern   Not on file  Social History Narrative   Not on file   Social Determinants of Health   Financial Resource Strain: Not  on file  Food Insecurity: Not on file  Transportation Needs: Not on file  Physical Activity: Not on file  Stress: Not on file  Social Connections: Not on file  Intimate Partner Violence: Not on file    Family History  Problem Relation Age of Onset   Hypertension Mother    Dementia Other    Hypertension Sister    Hypertension Brother     ROS- All systems are reviewed and negative except as per the HPI above  Physical Exam: Vitals:   10/06/21 0905  BP: (!) 148/84  Pulse: 76  Weight: 93 kg  Height: '5\' 11"'$  (1.803 m)   Wt Readings from Last 3 Encounters:  10/06/21 93 kg  09/19/21 94.7 kg  08/01/21 98.4 kg    Labs: Lab Results  Component Value Date   NA 138 05/16/2021   K 4.4 05/16/2021   CL 102 05/16/2021   CO2 30 05/16/2021   GLUCOSE 104 (H) 05/16/2021   BUN 18 05/16/2021   CREATININE 1.05 05/16/2021   CALCIUM 9.2 05/16/2021   MG 1.9 04/22/2018   No results found for: "INR" Lab Results  Component Value Date   CHOL 225 (H) 05/16/2021   HDL 35.20 (L) 05/16/2021   LDLCALC 164 (H) 05/16/2021   TRIG 127.0 05/16/2021     GEN- The patient is well appearing, alert and oriented x 3 today.   Head- normocephalic, atraumatic Eyes-  Sclera clear, conjunctiva pink Ears- hearing intact Oropharynx- clear Neck- supple, no JVP Lymph- no cervical lymphadenopathy Lungs- Clear to ausculation bilaterally,  normal work of breathing Heart- Regular rate and rhythm, no murmurs, rubs or gallops, PMI not laterally displaced GI- soft, NT, ND, + BS Extremities- no clubbing, cyanosis, or edema MS- no significant deformity or atrophy Skin- no rash or lesion Psych- euthymic mood, full affect Neuro- strength and sensation are intact  EKG- Vent. rate 76 BPM PR interval 140 ms QRS duration 82 ms QT/QTcB 362/407 ms P-R-T axes 68 47 51 Normal sinus rhythm Normal ECG When compared with ECG of 29-Apr-2020 09:19, PREVIOUS ECG IS PRESENT Epic records reviewed Echo-Study  Conclusions   - Left ventricle: The cavity size was normal. Wall thickness was   increased in a pattern of mild LVH. Systolic function was normal.   The estimated ejection fraction was in the range of 55% to 60%.   Wall motion was normal; there were no regional wall motion   abnormalities. Doppler parameters are consistent with abnormal   left ventricular relaxation (grade 1 diastolic dysfunction). - Aortic valve: Trileaflet; mildly thickened, mildly calcified   leaflets. - Left atrium: The atrium was normal in size.     Assessment and Plan: 1. Paroxysmal afib with RVR Staying in SR  Was on Multaq daily for many months Stopped later for  low afib burden  Wishes to use Multaq prn when he has to take prn steroids as he had afib once time in the past associated with this   Rx for  Multaq 400 mg bid when needed  He tolerated Multaq with heart rate and qt interval when on this daily  before He has 30 mg cardizem t use as needed   2. Chadsvasc score of 1 By guidelines, he does not need to be on anticoagulation at this point  He will be 67 in 05/2021, CHA2DS2VASc  score of 2 at that poin, so discussion will need to be had at next visit re daily DOAC He may want to consider Watchman as he has arthritis and he does not think he could stop taking meds daily  3. Lifestyle issues contributing to afib He was encouraged to use CPAP  4. HTN Stable   F/u in one year  Geroge Baseman. Hettie Roselli, [facility] [address] [place], [place] [id number] [phone]

## 2021-10-10 ENCOUNTER — Telehealth: Payer: Self-pay | Admitting: *Deleted

## 2021-10-10 NOTE — Telephone Encounter (Signed)
Attempted to call pt to see if things had been taken care of with [place] pt assistance for Trelegy but unable to reach. Left message for him to return call.

## 2021-10-10 NOTE — Telephone Encounter (Signed)
Spoke with patient regarding [place] application.  Application that patient states was dropped off could not be located.  Offered to mail a new application to the patient.  Advised patient could mail application back to the office if he would prefer.  Verified mailing address and application put in the mail.  Will await return of application.

## 2021-10-10 NOTE — Telephone Encounter (Signed)
John [name] please advise if you still have patient's [place] paperwork or please call him if he needs to come and fill it out again. I called [place] and they have not received his paperwork

## 2021-10-13 ENCOUNTER — Other Ambulatory Visit (HOSPITAL_COMMUNITY): Payer: Self-pay | Admitting: Nurse Practitioner

## 2021-10-17 ENCOUNTER — Other Ambulatory Visit: Payer: Self-pay

## 2021-10-17 MED ORDER — TRELEGY ELLIPTA 100-62.5-25 MCG/ACT IN AEPB: 1.0000 | INHALATION_SPRAY | Freq: Every day | RESPIRATORY_TRACT | 11 refills | Status: DC

## 2021-10-17 MED ORDER — TRELEGY ELLIPTA 100-62.5-25 MCG/ACT IN AEPB: 1.0000 | INHALATION_SPRAY | Freq: Every day | RESPIRATORY_TRACT | 0 refills | Status: DC

## 2021-10-17 NOTE — Telephone Encounter (Signed)
ATC patient to talk to him about samples and patient assistance paperwork, [facility]

## 2021-10-17 NOTE — Telephone Encounter (Signed)
Spoke with patient about Trelegy inhaler and patient assistance paperwork. She expressed his frustrations which were completely understandable. 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 I expressed understanding. He asked about email I told him that we don't typically give out our email because its our personal email but that I would do it for him so that we can get him taken care of. I have sent an email to the patient with the link for the [place] patient assistance paperwork. He said he is going to print it, fill it out and then email it back to me. Will await fax from patient.

## 2021-10-17 NOTE — Telephone Encounter (Signed)
Spoke with patient about Trelegy inhaler and patient assistance paperwork. She expressed his frustrations which were completely understandable. 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 I expressed understanding. He asked about email I told him that we don't typically give out our email because its our personal email but that I would do it for him so that we can get him taken care of. I have sent an email to the patient with the link for the [place] patient assistance paperwork. He said he is going to print it, fill it out and then email it back to me. Will await fax from patient. Please see telephone encounter for further updates. Will close this encounter.

## 2021-10-18 MED ORDER — TRELEGY ELLIPTA 100-62.5-25 MCG/ACT IN AEPB: 1.0000 | INHALATION_SPRAY | Freq: Every day | RESPIRATORY_TRACT | 11 refills | Status: DC

## 2021-10-18 NOTE — Telephone Encounter (Signed)
Patient assistance paperwork has been revieved, RX signed by Dr. Lamonte Sakai as he is provider of the day and faxed to [place]. A copy has been placed in the scan folder to go to scan and I have given the hard copy to Surveyor, quantity Tammy. The fax confirmation page has been emailed to patient. Nothing further needed at this time.

## 2021-10-26 ENCOUNTER — Other Ambulatory Visit: Payer: Self-pay | Admitting: Internal Medicine

## 2021-10-26 ENCOUNTER — Encounter: Payer: Self-pay | Admitting: Internal Medicine

## 2021-10-26 DIAGNOSIS — G8929 Other chronic pain: Secondary | ICD-10-CM

## 2021-10-26 DIAGNOSIS — M5010 Cervical disc disorder with radiculopathy, unspecified cervical region: Secondary | ICD-10-CM

## 2021-10-26 DIAGNOSIS — M159 Polyosteoarthritis, unspecified: Secondary | ICD-10-CM

## 2021-10-26 MED ORDER — MELOXICAM 15 MG PO TABS: 15.0000 mg | ORAL_TABLET | Freq: Every day | ORAL | 0 refills | Status: DC

## 2021-10-27 NOTE — Telephone Encounter (Signed)
Called [employer] on behalf of patient. I spoke with Colletta [place]. She stated that they did receive the application but the patient was denied due to his insurance. [employer] will not approve patients with insurance plans from the marketplace. Patient is not able to appeal this decision.

## 2021-10-27 NOTE — Telephone Encounter (Signed)
Patient is aware of the patient assistance denial for Trelegy. He is interested in Engineer, mining. He is aware that the Judithann Sauger will need a PA first to determine what his insurance will pay.   Dr. Valeta Harms, are you ok with [place] sending in a prescription for Breztri to start the PA?

## 2021-10-28 ENCOUNTER — Telehealth: Payer: Self-pay

## 2021-10-28 MED ORDER — BREZTRI AEROSPHERE 160-9-4.8 MCG/ACT IN AERO: 2.0000 | INHALATION_SPRAY | Freq: Two times a day (BID) | RESPIRATORY_TRACT | 5 refills | Status: DC

## 2021-10-28 NOTE — Addendum Note (Signed)
Addended by: Valerie Salts on: 10/28/2021 08:44 AM   Modules accepted: Orders

## 2021-10-28 NOTE — Telephone Encounter (Signed)
PA Team, can we start the PA for Breztri please? Thank you!

## 2021-10-28 NOTE — Telephone Encounter (Signed)
Patient Advocate Encounter   Received notification that prior authorization for Breztri Aerosphere 160-9-4.8MCG/ACT aerosol is required.   PA submitted on 10/28/2021 Key : [id number] Status is pending

## 2021-10-31 NOTE — Telephone Encounter (Signed)
Patient Advocate Encounter  Prior Authorization for Judithann Sauger has been approved.   PA Case ID: [id number]  Rx #: [id number] Effective dates: 10/28/2021 through 10/28/2022  Clista Bernhardt, CPhT Pharmacy Patient Advocate Specialist [place] Patient Advocate Team Phone: [phone]   Fax: [phone]

## 2021-11-02 ENCOUNTER — Other Ambulatory Visit: Payer: Self-pay

## 2021-11-02 ENCOUNTER — Encounter: Payer: Self-pay | Admitting: Family Medicine

## 2021-11-02 DIAGNOSIS — M5412 Radiculopathy, cervical region: Secondary | ICD-10-CM

## 2021-11-03 NOTE — Telephone Encounter (Signed)
Received the following message from patient:   "[employer] approved the [place] but at a $100 co-pay for 30 day supply, which is too much. Once I get on Medicare in Feb I can go back to the Trelegy which will only be $37/mo.  For now, I have to stay with Breo if that's what Dr, Valeta Harms wants. Can you please ask Dr Valeta Harms if he wants to add the 3rd medication in a separate inhaler or just keep taking the [facility] until Feb? Please advise. Thank you."  Dr. Valeta Harms, can you please advise? Thanks!

## 2021-11-10 ENCOUNTER — Other Ambulatory Visit: Payer: Self-pay | Admitting: Internal Medicine

## 2021-11-10 ENCOUNTER — Telehealth: Payer: Self-pay | Admitting: Pulmonary Disease

## 2021-11-10 ENCOUNTER — Telehealth: Payer: Self-pay | Admitting: Internal Medicine

## 2021-11-10 DIAGNOSIS — J309 Allergic rhinitis, unspecified: Secondary | ICD-10-CM

## 2021-11-10 DIAGNOSIS — E785 Hyperlipidemia, unspecified: Secondary | ICD-10-CM

## 2021-11-10 MED ORDER — ROSUVASTATIN CALCIUM 20 MG PO TABS: 20.0000 mg | ORAL_TABLET | Freq: Every day | ORAL | 1 refills | Status: DC

## 2021-11-10 NOTE — Telephone Encounter (Signed)
Pt is calling about his insurance to cover breztri. States the copay is over $100 and it's too expensive. Wants to know if he should stay with breo and add a third medication. States he left a message on the portal and has heard no reply for a week in regards to this. He wanted to speak with Maryann Conners, but she is out of office for a week. Please advise.

## 2021-11-10 NOTE — Telephone Encounter (Signed)
Caller & Relationship to patient: Pearse Shiffler  Call back number: [phone]  Date of last office visit: 05/16/21  Date of next office visit: 12/26/21  Medication(s) to be refilled:   rosuvastatin (CRESTOR) 20 MG tablet   Preferred Pharmacy:  [facility], [place] - [place] [address] Phone:  [phone]  Fax:  [phone]

## 2021-11-10 NOTE — Telephone Encounter (Signed)
Please refer to mychart encounter from 6/14.

## 2021-11-14 ENCOUNTER — Ambulatory Visit
Admission: RE | Admit: 2021-11-14 | Discharge: 2021-11-14 | Disposition: A | Discharge status: Home / Self Care | Payer: Commercial Managed Care - HMO | Source: Ambulatory Visit | Attending: Family Medicine | Admitting: Family Medicine

## 2021-11-14 DIAGNOSIS — M5412 Radiculopathy, cervical region: Secondary | ICD-10-CM

## 2021-11-14 MED ORDER — TRIAMCINOLONE ACETONIDE 40 MG/ML IJ SUSP (RADIOLOGY)
60.0000 mg | Freq: Once | INTRAMUSCULAR | Status: AC
  Administered 2021-11-14: 60 mg via EPIDURAL

## 2021-11-14 MED ORDER — IOPAMIDOL (ISOVUE-M 300) INJECTION 61%
1.0000 mL | Freq: Once | INTRAMUSCULAR | Status: AC
  Administered 2021-11-14: 1 mL via EPIDURAL

## 2021-11-14 NOTE — Discharge Instructions (Signed)

## 2021-11-20 ENCOUNTER — Other Ambulatory Visit: Payer: Self-pay | Admitting: Internal Medicine

## 2021-11-20 DIAGNOSIS — I10 Essential (primary) hypertension: Secondary | ICD-10-CM

## 2021-11-22 ENCOUNTER — Other Ambulatory Visit: Payer: Self-pay | Admitting: Internal Medicine

## 2021-11-22 ENCOUNTER — Telehealth: Payer: Self-pay | Admitting: Internal Medicine

## 2021-11-22 DIAGNOSIS — E291 Testicular hypofunction: Secondary | ICD-10-CM

## 2021-11-22 MED ORDER — TESTOSTERONE 50 MG/5GM (1%) TD GEL: 5.0000 g | Freq: Every day | TRANSDERMAL | 0 refills | Status: DC

## 2021-11-22 NOTE — Telephone Encounter (Signed)
Instructions on Testosterone were not clear - Please send an updated prescription to [facility] on [address]

## 2021-12-26 ENCOUNTER — Ambulatory Visit: Payer: Commercial Managed Care - HMO | Admitting: Internal Medicine

## 2022-01-06 ENCOUNTER — Encounter: Payer: Self-pay | Admitting: Family Medicine

## 2022-01-06 ENCOUNTER — Other Ambulatory Visit: Payer: Self-pay | Admitting: Internal Medicine

## 2022-01-06 DIAGNOSIS — I10 Essential (primary) hypertension: Secondary | ICD-10-CM

## 2022-01-09 MED ORDER — METHYLPREDNISOLONE 4 MG PO TBPK: ORAL_TABLET | ORAL | 0 refills | Status: DC

## 2022-01-09 NOTE — Addendum Note (Signed)
Addended by: Douglass Rivers T on: 01/09/2022 11:51 AM   Modules accepted: Orders

## 2022-01-11 ENCOUNTER — Other Ambulatory Visit: Payer: Self-pay

## 2022-01-11 DIAGNOSIS — M5412 Radiculopathy, cervical region: Secondary | ICD-10-CM

## 2022-01-12 ENCOUNTER — Other Ambulatory Visit: Payer: Self-pay | Admitting: Internal Medicine

## 2022-01-21 ENCOUNTER — Encounter: Payer: Self-pay | Admitting: Internal Medicine

## 2022-01-23 ENCOUNTER — Ambulatory Visit
Admission: RE | Admit: 2022-01-23 | Discharge: 2022-01-23 | Disposition: A | Discharge status: Home / Self Care | Payer: Commercial Managed Care - HMO | Source: Ambulatory Visit | Attending: Family Medicine | Admitting: Family Medicine

## 2022-01-23 DIAGNOSIS — M5412 Radiculopathy, cervical region: Secondary | ICD-10-CM

## 2022-01-23 MED ORDER — TRIAMCINOLONE ACETONIDE 40 MG/ML IJ SUSP (RADIOLOGY): 60.0000 mg | Freq: Once | INTRAMUSCULAR | Status: DC

## 2022-01-23 MED ORDER — IOPAMIDOL (ISOVUE-M 300) INJECTION 61%: 1.0000 mL | Freq: Once | INTRAMUSCULAR | Status: DC | PRN

## 2022-01-23 NOTE — Discharge Instructions (Signed)

## 2022-01-26 ENCOUNTER — Other Ambulatory Visit: Payer: Self-pay | Admitting: Internal Medicine

## 2022-01-26 DIAGNOSIS — M5010 Cervical disc disorder with radiculopathy, unspecified cervical region: Secondary | ICD-10-CM

## 2022-01-26 DIAGNOSIS — G8929 Other chronic pain: Secondary | ICD-10-CM

## 2022-01-26 DIAGNOSIS — M159 Polyosteoarthritis, unspecified: Secondary | ICD-10-CM

## 2022-01-30 ENCOUNTER — Encounter: Payer: Self-pay | Admitting: Internal Medicine

## 2022-01-30 ENCOUNTER — Ambulatory Visit (INDEPENDENT_AMBULATORY_CARE_PROVIDER_SITE_OTHER): Payer: Commercial Managed Care - HMO | Admitting: Internal Medicine

## 2022-01-30 VITALS — BP 166/92 | HR 87 | Temp 98.5°F | Ht 71.0 in | Wt 210.5 lb

## 2022-01-30 DIAGNOSIS — R200 Anesthesia of skin: Secondary | ICD-10-CM | POA: Diagnosis not present

## 2022-01-30 DIAGNOSIS — R072 Precordial pain: Secondary | ICD-10-CM

## 2022-01-30 DIAGNOSIS — I10 Essential (primary) hypertension: Secondary | ICD-10-CM

## 2022-01-30 DIAGNOSIS — J309 Allergic rhinitis, unspecified: Secondary | ICD-10-CM

## 2022-01-30 DIAGNOSIS — I480 Paroxysmal atrial fibrillation: Secondary | ICD-10-CM | POA: Diagnosis not present

## 2022-01-30 LAB — CBC WITH DIFFERENTIAL/PLATELET
Basophils Absolute: 0 10*3/uL (ref 0.0–0.1)
Basophils Relative: 0.2 % (ref 0.0–3.0)
Eosinophils Absolute: 0.2 10*3/uL (ref 0.0–0.7)
Eosinophils Relative: 2.1 % (ref 0.0–5.0)
HCT: 45.6 % (ref 39.0–52.0)
Hemoglobin: 15 g/dL (ref 13.0–17.0)
Lymphocytes Relative: 11.5 % — ABNORMAL LOW (ref 12.0–46.0)
Lymphs Abs: 1.2 10*3/uL (ref 0.7–4.0)
MCHC: 32.8 g/dL (ref 30.0–36.0)
MCV: 91.4 fl (ref 78.0–100.0)
Monocytes Absolute: 1.3 10*3/uL — ABNORMAL HIGH (ref 0.1–1.0)
Monocytes Relative: 11.8 % (ref 3.0–12.0)
Neutro Abs: 8 10*3/uL — ABNORMAL HIGH (ref 1.4–7.7)
Neutrophils Relative %: 74.4 % (ref 43.0–77.0)
Platelets: 171 10*3/uL (ref 150.0–400.0)
RBC: 4.99 Mil/uL (ref 4.22–5.81)
RDW: 13.9 % (ref 11.5–15.5)
WBC: 10.8 10*3/uL — ABNORMAL HIGH (ref 4.0–10.5)

## 2022-01-30 LAB — BASIC METABOLIC PANEL
BUN: 20 mg/dL (ref 6–23)
CO2: 27 mEq/L (ref 19–32)
Calcium: 9 mg/dL (ref 8.4–10.5)
Chloride: 104 mEq/L (ref 96–112)
Creatinine, Ser: 0.94 mg/dL (ref 0.40–1.50)
GFR: 85.55 mL/min (ref >60.00)
Glucose, Bld: 106 mg/dL — ABNORMAL HIGH (ref 70–99)
Potassium: 4.3 mEq/L (ref 3.5–5.1)
Sodium: 138 mEq/L (ref 135–145)

## 2022-01-30 LAB — TROPONIN I (HIGH SENSITIVITY): High Sens Troponin I: 5 ng/L (ref 2–17)

## 2022-01-30 LAB — VITAMIN B12: Vitamin B-12: 1091 pg/mL — ABNORMAL HIGH (ref 211–911)

## 2022-01-30 LAB — FOLATE: Folate: >23.8 ng/mL (ref >5.9)

## 2022-01-30 MED ORDER — IPRATROPIUM BROMIDE 0.06 % NA SOLN: 1.0000 | Freq: Two times a day (BID) | NASAL | 3 refills | Status: DC

## 2022-01-30 NOTE — Progress Notes (Signed)
Subjective:  Patient ID: John [name], male    DOB: Aug 15, 1955  Age: 66 y.o. MRN: [id number]  CC: Hypertension   HPI John [name] presents for f/up -  He complains of a several month history of chest pain that he describes as twinges in his chest that occur at rest.  He is active and denies exertional shortness of breath, chest pain, diaphoresis, dizziness, lightheadedness, palpitations, or edema.  He complains of a 1 year history of numbness in his feet.  He tells me his systolic blood pressure at home is 120-130.  Outpatient Medications Prior to Visit  Medication Sig Dispense Refill   Budeson-Glycopyrrol-Formoterol (BREZTRI AEROSPHERE) 160-9-4.8 MCG/ACT AERO Inhale 2 puffs into the lungs in the morning and at bedtime. (Patient not taking: Reported on 01/30/2022) 10.7 g 5   carvedilol (COREG) 3.125 MG tablet TAKE 1 TABLET(3.125 MG) BY MOUTH TWICE DAILY WITH A MEAL 180 tablet 0   cholecalciferol (VITAMIN D3) 25 MCG (1000 UT) tablet Take 1,000 Units by mouth 2 (two) times daily. (Patient not taking: Reported on 10/06/2021)     Coenzyme Q10 (CO Q 10 PO) Take 400 mg by mouth every evening.     DHEA 25 MG CAPS Take 2 capsules by mouth every evening.     diltiazem (CARDIZEM CD) 120 MG 24 hr capsule Take 1 capsule (120 mg total) by mouth daily. 90 capsule 1   diltiazem (CARDIZEM) 30 MG tablet TAKE 1 TABLET BY MOUTH EVERY 4 HOURS AS NEEDED HR ABOVE 100 AND TOP NUMBER B/P GREATER THAN 100 30 tablet 1   dronedarone (MULTAQ) 400 MG tablet TAKE 1 TABLET BY MOUTH 2 TIMES DAILY AS NEEDED (BREAKTHROUGH AFIB) (Patient not taking: Reported on 01/30/2022) 36 tablet 0   esomeprazole (NEXIUM) 20 MG capsule Take 1 capsule (20 mg total) by mouth daily at 12 noon. 90 capsule 1   Flaxseed, Linseed, (FLAX SEED OIL PO) Take 1 tablet by mouth every morning.     fluticasone (FLONASE) 50 MCG/ACT nasal spray Place 2 sprays into both nostrils daily.     Fluticasone-Umeclidin-Vilant (TRELEGY ELLIPTA) 100-62.5-25 MCG/ACT  AEPB Inhale 1 puff into the lungs daily. 60 each 11   meloxicam (MOBIC) 15 MG tablet TAKE 1 TABLET(15 MG) BY MOUTH DAILY 90 tablet 0   montelukast (SINGULAIR) 10 MG tablet TAKE 1 TABLET(10 MG) BY MOUTH DAILY 90 tablet 0   Multiple Vitamins-Minerals (MULTIVITAMIN WITH MINERALS) tablet Take 1 tablet by mouth daily.     olmesartan (BENICAR) 40 MG tablet TAKE 1 TABLET(40 MG) BY MOUTH DAILY 90 tablet 0   Omega-3 Fatty Acids (FISH OIL) 1200 MG CAPS Take 1 capsule by mouth in the morning and at bedtime.     PEPPERMINT OIL PO Take 2 capsules by mouth every morning.     rosuvastatin (CRESTOR) 20 MG tablet Take 1 tablet (20 mg total) by mouth daily. 90 tablet 1   sildenafil (VIAGRA) 100 MG tablet Take 1 tablet (100 mg total) by mouth daily as needed for erectile dysfunction. 20 tablet 4   Specialty Vitamins Products (MAGNESIUM, AMINO ACID CHELATE,) 133 MG tablet 1/2 tablet by mouth daily     testosterone (ANDROGEL) 50 MG/5GM (1%) GEL Place 5 g onto the skin daily. 450 g 0   traMADol (ULTRAM) 50 MG tablet Take 1 tablet (50 mg total) by mouth 2 (two) times daily. 60 tablet 3   Turmeric 450 MG CAPS Take 1 capsule by mouth 2 (two) times daily.     fluticasone (  FLOVENT HFA) 110 MCG/ACT inhaler Inhale 2 puffs into the lungs 2 (two) times daily. (Patient not taking: Reported on 01/30/2022) 1 each 12   ipratropium (ATROVENT) 0.06 % nasal spray Place 1 spray into both nostrils 2 (two) times daily. 90 mL 1   methylPREDNISolone (MEDROL DOSEPAK) 4 MG TBPK tablet 4 pills day 1 and 2, 3 pills day 3 and 4, 2 pills day 5 and 6, 1 pill day thereafter until gone 21 tablet 0   tiZANidine (ZANAFLEX) 4 MG tablet Take 1 tablet (4 mg total) by mouth every 6 (six) hours as needed for muscle spasms. 30 tablet 0   vitamin B-12 (CYANOCOBALAMIN) 100 MCG tablet Take 50 mcg by mouth daily.     No facility-administered medications prior to visit.    ROS Review of Systems  Constitutional:  Negative for chills, diaphoresis, fatigue  and fever.  HENT:  Positive for rhinorrhea.   Eyes: Negative.   Respiratory:  Negative for cough, choking, chest tightness and shortness of breath.   Cardiovascular:  Positive for chest pain. Negative for palpitations and leg swelling.  Gastrointestinal:  Negative for abdominal pain, diarrhea and nausea.  Endocrine: Negative.   Genitourinary: Negative.  Negative for difficulty urinating.  Musculoskeletal: Negative.   Skin: Negative.   Neurological:  Positive for numbness. Negative for dizziness and weakness.  Hematological:  Negative for adenopathy. Does not bruise/bleed easily.  Psychiatric/Behavioral: Negative.      Objective:  BP (!) 160/100   Pulse 87   Temp 98.5 F (36.9 C) (Oral)   Ht '5\' 11"'$  (1.803 m)   Wt 210 lb 8 oz (95.5 kg)   SpO2 97%   BMI 29.36 kg/m   BP Readings from Last 3 Encounters:  01/30/22 (!) 160/100  01/23/22 (!) 156/90  11/14/21 (!) 145/85    Wt Readings from Last 3 Encounters:  01/30/22 210 lb 8 oz (95.5 kg)  10/06/21 205 lb (93 kg)  09/19/21 208 lb 12.8 oz (94.7 kg)    Physical Exam Vitals reviewed.  HENT:     Nose: Nose normal.     Mouth/Throat:     Mouth: Mucous membranes are moist.  Eyes:     General: No scleral icterus.    Conjunctiva/sclera: Conjunctivae normal.  Cardiovascular:     Rate and Rhythm: Normal rate and regular rhythm.     Heart sounds: Normal heart sounds, S1 normal and S2 normal.     Comments: EKG- SR with PSVT complexes No ST/T wave changes, LVH, or Q waves Pulmonary:     Breath sounds: No stridor. No wheezing, rhonchi or rales.  Abdominal:     General: Abdomen is flat.     Palpations: There is no mass.     Tenderness: There is no abdominal tenderness. There is no guarding.     Hernia: No hernia is present.  Musculoskeletal:     Cervical back: Neck supple.     Right lower leg: No edema.     Left lower leg: No edema.  Skin:    General: Skin is warm and dry.  Neurological:     General: No focal deficit  present.     Mental Status: He is alert and oriented to person, place, and time. Mental status is at baseline.  Psychiatric:        Mood and Affect: Mood normal.     Lab Results  Component Value Date   WBC 10.8 (H) 01/30/2022   HGB 15.0 01/30/2022   HCT 45.6 01/30/2022  PLT 171.0 01/30/2022   GLUCOSE 106 (H) 01/30/2022   CHOL 225 (H) 05/16/2021   TRIG 127.0 05/16/2021   HDL 35.20 (L) 05/16/2021   LDLCALC 164 (H) 05/16/2021   ALT 29 05/16/2021   AST 21 05/16/2021   NA 138 01/30/2022   K 4.3 01/30/2022   CL 104 01/30/2022   CREATININE 0.94 01/30/2022   BUN 20 01/30/2022   CO2 27 01/30/2022   TSH 1.12 05/16/2021   PSA 1.35 05/16/2021   HGBA1C 5.7 05/06/2020    DG INJECT DIAG/THERA/INC NEEDLE/CATH/PLC EPI/CERV/THOR W/IMG  Result Date: 01/23/2022 CLINICAL DATA:  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 Recurrent neck and right arm pain with numbness. Positive response to multiple prior epidural injections though with gradually decreasing effectiveness over time. FLUOROSCOPY: Radiation Exposure Index (as provided by the fluoroscopic device): 1.30 mGy Kerma PROCEDURE: The procedure, risks, benefits, and alternatives were explained to the patient. Questions regarding the procedure were encouraged and answered. The patient understands and consents to the procedure. CERVICAL EPIDURAL INJECTION An interlaminar approach was performed on the right at C7-T1. A 3.5 inch 20 gauge epidural needle was advanced using loss-of-resistance technique. DIAGNOSTIC EPIDURAL INJECTION Injection of Isovue-M 300 shows a good epidural pattern with spread above and below the level of needle placement, primarily on the right. No vascular opacification is seen. THERAPEUTIC EPIDURAL INJECTION 1.5 ml of Kenalog 40 mixed with 2 ml of normal saline were then instilled. The procedure was well-tolerated, and the patient was discharged thirty minutes following the injection in good condition.  IMPRESSION: Technically successful interlaminar epidural injection on the right at C7-T1. Electronically Signed   By: Logan Bores M.D.   On: 01/23/2022 14:12    Assessment & Plan:   Johnhenry was seen today for hypertension.  Diagnoses and all orders for this visit:  PAF (paroxysmal atrial fibrillation) ([place])- He has good rate and rhythm control.  Primary hypertension- I think there is the whitecoat phenomenon.  Will continue the current antihypertensives. -     CBC with Differential/Platelet; Future -     Basic metabolic panel; Future -     Basic metabolic panel -     CBC with Differential/Platelet  Numbness in feet- His labs are negative for secondary causes.  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 -     Vitamin B12; Future -     Folate; Future -     Vitamin B1; Future -     Vitamin B1 -     Folate -     Vitamin B12  Precordial pain- EKG and labs are reassuring.  I recommended that he undergo an exercise tolerance test. -     Troponin I (High Sensitivity); Future -     EXERCISE TOLERANCE TEST (ETT); Future -     Troponin I (High Sensitivity) -     Cardiac Stress Test: Informed Consent Details: Physician/Practitioner Attestation; Transcribe to consent form and obtain patient signature; Future  Allergic rhinitis, unspecified seasonality, unspecified trigger -     ipratropium (ATROVENT) 0.06 % nasal spray; Place 1 spray into both nostrils 2 (two) times daily.   I have discontinued John [name]. John [name] vitamin B-12, tiZANidine, and methylPREDNISolone. I am also having him maintain his multivitamin with minerals, magnesium (amino acid chelate), Fish Oil, cholecalciferol, Turmeric, Multaq, sildenafil, esomeprazole, diltiazem, (Flaxseed, Linseed, (FLAX SEED OIL PO)), PEPPERMINT OIL PO, Coenzyme Q10 (CO Q 10 PO), DHEA, fluticasone, traMADol, diltiazem, Trelegy Ellipta, Breztri Aerosphere, montelukast, rosuvastatin, olmesartan, testosterone, carvedilol, meloxicam, and  ipratropium.  Meds ordered this encounter  Medications   ipratropium (ATROVENT) 0.06 % nasal spray    Sig: Place 1 spray into both nostrils 2 (two) times daily.    Dispense:  90 mL    Refill:  3     Follow-up: Return in about 3 months (around 05/02/2022).  Scarlette Calico, MD

## 2022-01-30 NOTE — Patient Instructions (Signed)
Hypertension, Adult High blood pressure (hypertension) is when the force of blood pumping through the arteries is too strong. The arteries are the blood vessels that carry blood from the heart throughout the body. Hypertension forces the heart to work harder to pump blood and may cause arteries to become narrow or stiff. Untreated or uncontrolled hypertension can lead to a heart attack, heart failure, a stroke, kidney disease, and other problems. A blood pressure reading consists of a higher number over a lower number. Ideally, your blood pressure should be below 120/80. The first ("top") number is called the systolic pressure. It is a measure of the pressure in your arteries as your heart beats. The second ("bottom") number is called the diastolic pressure. It is a measure of the pressure in your arteries as the heart relaxes. What are the causes? The exact cause of this condition is not known. There are some conditions that result in high blood pressure. What increases the risk? Certain factors may make you more likely to develop high blood pressure. Some of these risk factors are under your control, including: Smoking. Not getting enough exercise or physical activity. Being overweight. Having too much fat, sugar, calories, or salt (sodium) in your diet. Drinking too much alcohol. Other risk factors include: Having a personal history of heart disease, diabetes, high cholesterol, or kidney disease. Stress. Having a family history of high blood pressure and high cholesterol. Having obstructive sleep apnea. Age. The risk increases with age. What are the signs or symptoms? High blood pressure may not cause symptoms. Very high blood pressure (hypertensive crisis) may cause: Headache. Fast or irregular heartbeats (palpitations). Shortness of breath. Nosebleed. Nausea and vomiting. Vision changes. Severe chest pain, dizziness, and seizures. How is this diagnosed? This condition is diagnosed by  measuring your blood pressure while you are seated, with your arm resting on a flat surface, your legs uncrossed, and your feet flat on the floor. The cuff of the blood pressure monitor will be placed directly against the skin of your upper arm at the level of your heart. Blood pressure should be measured at least twice using the same arm. Certain conditions can cause a difference in blood pressure between your right and left arms. If you have a high blood pressure reading during one visit or you have normal blood pressure with other risk factors, you may be asked to: Return on a different day to have your blood pressure checked again. Monitor your blood pressure at home for 1 week or longer. If you are diagnosed with hypertension, you may have other blood or imaging tests to help your health care provider understand your overall risk for other conditions. How is this treated? This condition is treated by making healthy lifestyle changes, such as eating healthy foods, exercising more, and reducing your alcohol intake. You may be referred for counseling on a healthy diet and physical activity. Your health care provider may prescribe medicine if lifestyle changes are not enough to get your blood pressure under control and if: Your systolic blood pressure is above 130. Your diastolic blood pressure is above 80. Your personal target blood pressure may vary depending on your medical conditions, your age, and other factors. Follow these instructions at home: Eating and drinking  Eat a diet that is high in fiber and potassium, and low in sodium, added sugar, and fat. An example of this eating plan is called the DASH diet. DASH stands for Dietary Approaches to Stop Hypertension. To eat this way: Eat   plenty of fresh fruits and vegetables. Try to fill one half of your plate at each meal with fruits and vegetables. Eat whole grains, such as whole-wheat pasta, brown rice, or whole-grain bread. Fill about one  fourth of your plate with whole grains. Eat or drink low-fat dairy products, such as skim milk or low-fat yogurt. Avoid fatty cuts of meat, processed or cured meats, and poultry with skin. Fill about one fourth of your plate with lean proteins, such as fish, chicken without skin, beans, eggs, or tofu. Avoid pre-made and processed foods. These tend to be higher in sodium, added sugar, and fat. Reduce your daily sodium intake. Many people with hypertension should eat less than 1,500 mg of sodium a day. Do not drink alcohol if: Your health care provider tells you not to drink. You are pregnant, may be pregnant, or are planning to become pregnant. If you drink alcohol: Limit how much you have to: 0-1 drink a day for women. 0-2 drinks a day for men. Know how much alcohol is in your drink. In the [place] one drink equals one 12 oz bottle of beer (355 mL), one 5 oz glass of wine (148 mL), or one 1 oz glass of hard liquor (44 mL). Lifestyle  Work with your health care provider to maintain a healthy body weight or to lose weight. Ask what an ideal weight is for you. Get at least 30 minutes of exercise that causes your heart to beat faster (aerobic exercise) most days of the week. Activities may include walking, swimming, or biking. Include exercise to strengthen your muscles (resistance exercise), such as Pilates or lifting weights, as part of your weekly exercise routine. Try to do these types of exercises for 30 minutes at least 3 days a week. Do not use any products that contain nicotine or tobacco. These products include cigarettes, chewing tobacco, and vaping devices, such as e-cigarettes. If you need help quitting, ask your health care provider. Monitor your blood pressure at home as told by your health care provider. Keep all follow-up visits. This is important. Medicines Take over-the-counter and prescription medicines only as told by your health care provider. Follow directions carefully. Blood  pressure medicines must be taken as prescribed. Do not skip doses of blood pressure medicine. Doing this puts you at risk for problems and can make the medicine less effective. Ask your health care provider about side effects or reactions to medicines that you should watch for. Contact a health care provider if you: Think you are having a reaction to a medicine you are taking. Have headaches that keep coming back (recurring). Feel dizzy. Have swelling in your ankles. Have trouble with your vision. Get help right away if you: Develop a severe headache or confusion. Have unusual weakness or numbness. Feel faint. Have severe pain in your chest or abdomen. Vomit repeatedly. Have trouble breathing. These symptoms may be an emergency. Get help right away. Call 911. Do not wait to see if the symptoms will go away. Do not drive yourself to the hospital. Summary Hypertension is when the force of blood pumping through your arteries is too strong. If this condition is not controlled, it may put you at risk for serious complications. Your personal target blood pressure may vary depending on your medical conditions, your age, and other factors. For most people, a normal blood pressure is less than 120/80. Hypertension is treated with lifestyle changes, medicines, or a combination of both. Lifestyle changes include losing weight, eating a healthy,   low-sodium diet, exercising more, and limiting alcohol. This information is not intended to replace advice given to you by your health care provider. Make sure you discuss any questions you have with your health care provider. Document Revised: 02/01/2021 Document Reviewed: 02/01/2021 Elsevier Patient Education  2023 Elsevier Inc.  

## 2022-01-31 ENCOUNTER — Telehealth: Payer: Self-pay | Admitting: Internal Medicine

## 2022-01-31 NOTE — Telephone Encounter (Signed)
Received Preoperative Clearance from [facility]. Forms were printed and placed in Dr. Ronnald Ramp box at the front.

## 2022-02-01 ENCOUNTER — Encounter: Payer: Self-pay | Admitting: Internal Medicine

## 2022-02-01 NOTE — Telephone Encounter (Signed)
Form placed on PCP's desk.

## 2022-02-02 ENCOUNTER — Telehealth: Payer: Commercial Managed Care - HMO | Admitting: Nurse Practitioner

## 2022-02-02 LAB — VITAMIN B1: Vitamin B1 (Thiamine): 106 nmol/L — ABNORMAL HIGH (ref 8–30)

## 2022-02-02 NOTE — Progress Notes (Signed)
Called patient for his virtual appointment. Verified correct patient via name and DOB. He mentioned that he did not request appointment and does not feel he needs to be evaluated today. Appointment not completed per patient preference.

## 2022-02-03 ENCOUNTER — Encounter: Payer: Self-pay | Admitting: Internal Medicine

## 2022-02-05 ENCOUNTER — Other Ambulatory Visit: Payer: Self-pay | Admitting: Internal Medicine

## 2022-02-05 DIAGNOSIS — J309 Allergic rhinitis, unspecified: Secondary | ICD-10-CM

## 2022-02-06 ENCOUNTER — Telehealth: Payer: Self-pay | Admitting: Internal Medicine

## 2022-02-06 ENCOUNTER — Other Ambulatory Visit: Payer: Self-pay | Admitting: Internal Medicine

## 2022-02-06 ENCOUNTER — Encounter: Payer: Self-pay | Admitting: Internal Medicine

## 2022-02-06 DIAGNOSIS — R072 Precordial pain: Secondary | ICD-10-CM

## 2022-02-06 NOTE — Telephone Encounter (Signed)
Patient is scheduled for a treadmill test on 02/13/2022.  He has injured his back and does not feel like he will be able to complete the treadmill test.  He wants to know if this can be switched to the other test.  Paitent is not sure what this was  - Please advise.

## 2022-02-07 NOTE — Telephone Encounter (Signed)
Patient called back - he still needs to know about the other test.  He will be unable to do the treadmill test and would like the CT or CAT ordered.  Please advise.

## 2022-02-08 NOTE — Addendum Note (Signed)
Addended by: Hinda Kehr on: 02/08/2022 10:55 AM   Modules accepted: Orders

## 2022-02-13 ENCOUNTER — Ambulatory Visit
Admission: RE | Admit: 2022-02-13 | Discharge: 2022-02-13 | Disposition: A | Discharge status: Home / Self Care | Payer: No Typology Code available for payment source | Source: Ambulatory Visit | Attending: Internal Medicine | Admitting: Internal Medicine

## 2022-02-13 ENCOUNTER — Encounter (HOSPITAL_COMMUNITY): Payer: Commercial Managed Care - HMO

## 2022-02-13 ENCOUNTER — Encounter: Payer: Self-pay | Admitting: Internal Medicine

## 2022-02-13 DIAGNOSIS — R072 Precordial pain: Secondary | ICD-10-CM

## 2022-02-16 ENCOUNTER — Other Ambulatory Visit: Payer: Self-pay | Admitting: Internal Medicine

## 2022-02-16 DIAGNOSIS — I10 Essential (primary) hypertension: Secondary | ICD-10-CM

## 2022-02-20 ENCOUNTER — Telehealth: Payer: Self-pay | Admitting: *Deleted

## 2022-02-20 NOTE — Telephone Encounter (Signed)
Patient is calling to ask if he can get a stronger compound from [place], (pharmacy said that they do make a terbinafine- 5%,urea- 40 % and is stronger, but will need his approval. Stated that the nail is doing ok but he feels that  the stronger compound will help even more.Explained to patient that he will probably need an appointment since he has not been seen since 2021-(Terbinafine),verbalized understanding but said that he is having surgery soon and cannot come in until Jan.  Please advise.

## 2022-02-20 NOTE — Telephone Encounter (Signed)
Appointment for virtual

## 2022-02-20 NOTE — Telephone Encounter (Signed)
Lvm for pt to cb to sched virtual appt w/ Dr. Jacqualyn Posey on 11/20

## 2022-02-23 ENCOUNTER — Other Ambulatory Visit: Payer: Self-pay | Admitting: Internal Medicine

## 2022-02-23 DIAGNOSIS — I480 Paroxysmal atrial fibrillation: Secondary | ICD-10-CM

## 2022-02-23 DIAGNOSIS — I10 Essential (primary) hypertension: Secondary | ICD-10-CM

## 2022-03-02 ENCOUNTER — Encounter: Payer: Self-pay | Admitting: Family Medicine

## 2022-03-06 MED ORDER — METHYLPREDNISOLONE 4 MG PO TBPK: ORAL_TABLET | ORAL | 0 refills | Status: DC

## 2022-03-06 NOTE — Telephone Encounter (Signed)
Spoke with pharmacy. Dose pak starts with 6 pills for day 1 and then tapers off. Asked for pills to be packaged as loose pills in order for directions to remain the same as what was called in. Pharmacy will follow directions as prescribed.

## 2022-03-21 ENCOUNTER — Other Ambulatory Visit: Payer: Self-pay

## 2022-03-21 MED ORDER — METHYLPREDNISOLONE 4 MG PO TBPK: ORAL_TABLET | ORAL | 0 refills | Status: DC

## 2022-03-28 ENCOUNTER — Other Ambulatory Visit: Payer: Self-pay | Admitting: Neurosurgery

## 2022-03-29 ENCOUNTER — Other Ambulatory Visit: Payer: Self-pay

## 2022-03-29 ENCOUNTER — Encounter (HOSPITAL_COMMUNITY): Payer: Self-pay | Admitting: *Deleted

## 2022-03-29 ENCOUNTER — Other Ambulatory Visit (HOSPITAL_COMMUNITY): Payer: Self-pay | Admitting: Neurosurgery

## 2022-03-29 DIAGNOSIS — I10 Essential (primary) hypertension: Secondary | ICD-10-CM

## 2022-03-29 NOTE — Progress Notes (Signed)
PCP - Scarlette Calico, MD Cardiologist - denies  PPM/ICD - denies  Chest x-ray - 06/23/19 EKG - 01/30/22 Stress Test - denies ECHO - 11/26/16 Cardiac Cath - denies  CPAP - positive for OSA - not wearing  Fasting Blood Sugar - n/a  Blood Thinner Instructions: n/a Aspirin Instructions: Patient was instructed: As of today, STOP taking any Aspirin (unless otherwise instructed by your surgeon) Aleve, Naproxen, Ibuprofen, Motrin, Advil, Goody's, BC's, all herbal medications, fish oil, and all vitamins.  ERAS Protcol - n/a  COVID TEST- n/a  Anesthesia review: yes - history of A Fib. Patient was scheduled for this [facility] on 03[facility]28/22 but his BP dropped and they canceled surgery and rescheduled for Friday at [facility]. Friday morning at 8 AM patient had an appointment for Echo at [facility]. Patient denies any distress at this time.   Patient verbally denies any shortness of breath, fever, cough and chest pain during phone call   -------------  SDW INSTRUCTIONS given:  Your procedure is scheduled on Friday, December 22nd, 2023.  Report to [facility] Main Entrance "A" at 10:00 A.M., and check in at the Admitting office.  Call this number if you have problems the morning of surgery:  [phone]   Remember:  Do not eat or drink after midnight the night before your surgery    Take these medicines the morning of surgery with A SIP OF WATER - Coreg, Singular, Flonase, Atrovent, inhalers PRN: Cardizem  Please bring all inhalers with you the day of surgery.     The day of surgery:                     Do not wear jewelry,             Do not wear lotions, powders, colognes, or deodorant.            Men may shave face and neck.            Do not bring valuables to the hospital.            [facility] is not responsible for any belongings or valuables.  Do NOT Smoke (Tobacco/Vaping) 24 hours prior to your procedure If you use a CPAP at night, you may bring all equipment for your  overnight stay.   Contacts, glasses, dentures or bridgework may not be worn into surgery.      For patients admitted to the hospital, discharge time will be determined by your treatment team.   Patients discharged the day of surgery will not be allowed to drive home, and someone needs to stay with them for 24 hours.    Special instructions:   [facility]- Preparing For Surgery  Before surgery, you can play an important role. Because skin is not sterile, your skin needs to be as free of germs as possible. You can reduce the number of germs on your skin by washing with CHG (chlorahexidine gluconate) Soap before surgery.  CHG is an antiseptic cleaner which kills germs and bonds with the skin to continue killing germs even after washing.    Oral Hygiene is also important to reduce your risk of infection.  Remember - BRUSH YOUR TEETH THE MORNING OF SURGERY WITH YOUR REGULAR TOOTHPASTE  Please do not use if you have an allergy to CHG or antibacterial soaps. If your skin becomes reddened/irritated stop using the CHG.  Do not shave (including legs and underarms) for at least 48 hours prior  to first CHG shower. It is OK to shave your face.  Please follow these instructions carefully.   Shower the NIGHT BEFORE SURGERY and the MORNING OF SURGERY with DIAL Soap.   Pat yourself dry with a CLEAN TOWEL.  Wear CLEAN PAJAMAS to bed the night before surgery  Place CLEAN SHEETS on your bed the night of your first shower and DO NOT SLEEP WITH PETS.   Day of Surgery: Please shower morning of surgery  Wear Clean/Comfortable clothing the morning of surgery Do not apply any deodorants/lotions.   Remember to brush your teeth WITH YOUR REGULAR TOOTHPASTE.   Questions were answered. Patient verbalized understanding of instructions.

## 2022-03-30 ENCOUNTER — Ambulatory Visit (HOSPITAL_COMMUNITY): Payer: Commercial Managed Care - HMO | Admitting: Physician Assistant

## 2022-03-30 NOTE — Progress Notes (Signed)
Anesthesia Chart Review: Same day workup  Pt evaluated by his PCP Dr. Scarlette Calico on 01/30/22 for several month history of chest pain described as "twinges" at rest. He denied any exertional symptoms, no DOE or chest pain with activity. He has a hx of paroxysmal afib, not on anticoagulation. Dr. Ronnald Ramp notes he has good rate/rhythm control. EKG and labs were noted to be reassuring. Exercise tolerance test was ordered, however, due to pt's orthopedic limitations, this was switched to a CT coronary calcium score. CT showed total calcium score of 41.7 which is at percentile 44 for subjects of the same age, gender, and race/ethnicity. Dr. Ronnald Ramp subsequently cleared the pt for surgery as moderate risk  Former smoker with associated COPD. Maintained on Trelegy Ellipta.  OSA, not on CPAP.  Surgery originally scheduled at surgery center, however, pt became hypotensive on induction and case was aborted. Dr. Marcello Moores moved case to [facility] and ordered echo. Echo to be done on morning of surgery with expedited read requested.  Pt will need DOS labs and evaluation.  EKG 01/30/22: Sinus rhythm with PACS. Rate 77.  CT coronary calcium scoring 02/13/22: IMPRESSION: 1. Total calcium score of 41.7 is at percentile 44 for subjects of the same age, gender, and race/ethnicity. 2. Clustered small solid nodules of the left lower lobe with adjacent ground-glass opacity, findings are most likely due to infection or aspiration. Largest nodule measures 8 mm. No follow-up needed if patient is low-risk (and has no known or suspected primary neoplasm). Non-contrast chest CT can be considered in 12 months if patient is high-risk. This recommendation follows the consensus statement: Guidelines for Management of Incidental Pulmonary Nodules Detected on CT Images: From the Fleischner Society 2017; Radiology 2017; 284:228-243. 3. Aortic Atherosclerosis (ICD10-I70.0).   Wynonia Musty [facility] Short Stay  Center/Anesthesiology Phone [phone] 03/30/2022 11:39 AM

## 2022-03-31 ENCOUNTER — Ambulatory Visit (HOSPITAL_COMMUNITY)
Admission: RE | Admit: 2022-03-31 | Discharge: 2022-03-31 | Disposition: A | Discharge status: Home / Self Care | Payer: Commercial Managed Care - HMO | Source: Ambulatory Visit | Attending: Neurosurgery | Admitting: Neurosurgery

## 2022-03-31 ENCOUNTER — Other Ambulatory Visit (HOSPITAL_COMMUNITY): Payer: Commercial Managed Care - HMO

## 2022-03-31 ENCOUNTER — Ambulatory Visit (HOSPITAL_BASED_OUTPATIENT_CLINIC_OR_DEPARTMENT_OTHER)
Admission: RE | Admit: 2022-03-31 | Discharge: 2022-03-31 | Disposition: A | Discharge status: Home / Self Care | Payer: Commercial Managed Care - HMO | Source: Ambulatory Visit | Attending: Neurosurgery | Admitting: Neurosurgery

## 2022-03-31 ENCOUNTER — Encounter (HOSPITAL_COMMUNITY): Admission: RE | Payer: Self-pay | Source: Ambulatory Visit | Attending: Neurosurgery

## 2022-03-31 DIAGNOSIS — I5030 Unspecified diastolic (congestive) heart failure: Secondary | ICD-10-CM

## 2022-03-31 DIAGNOSIS — E785 Hyperlipidemia, unspecified: Secondary | ICD-10-CM | POA: Diagnosis not present

## 2022-03-31 DIAGNOSIS — Z87891 Personal history of nicotine dependence: Secondary | ICD-10-CM | POA: Insufficient documentation

## 2022-03-31 DIAGNOSIS — I10 Essential (primary) hypertension: Secondary | ICD-10-CM | POA: Diagnosis not present

## 2022-03-31 DIAGNOSIS — I7121 Aneurysm of the ascending aorta, without rupture: Secondary | ICD-10-CM | POA: Diagnosis not present

## 2022-03-31 DIAGNOSIS — I700 Atherosclerosis of aorta: Secondary | ICD-10-CM | POA: Diagnosis not present

## 2022-03-31 DIAGNOSIS — Z01818 Encounter for other preprocedural examination: Secondary | ICD-10-CM | POA: Insufficient documentation

## 2022-03-31 DIAGNOSIS — G4730 Sleep apnea, unspecified: Secondary | ICD-10-CM | POA: Diagnosis not present

## 2022-03-31 DIAGNOSIS — R918 Other nonspecific abnormal finding of lung field: Secondary | ICD-10-CM | POA: Diagnosis not present

## 2022-03-31 LAB — ECHOCARDIOGRAM COMPLETE
Area-P 1/2: 3.72 cm2
MV M vel: 3.84 m/s
MV Peak grad: 59 mmHg
S' Lateral: 2.8 cm

## 2022-03-31 SURGERY — ANTERIOR CERVICAL DECOMPRESSION/DISCECTOMY FUSION 1 LEVEL
Anesthesia: General

## 2022-03-31 MED ORDER — CHLORHEXIDINE GLUCONATE CLOTH 2 % EX PADS: 6.0000 | MEDICATED_PAD | Freq: Once | CUTANEOUS | Status: DC

## 2022-03-31 MED ORDER — CEFAZOLIN SODIUM-DEXTROSE 2-4 GM/100ML-% IV SOLN: 2.0000 g | INTRAVENOUS | Status: DC

## 2022-03-31 NOTE — Progress Notes (Signed)
  Echocardiogram 2D Echocardiogram has been performed.  John [name] 03/31/2022, 9:43 AM

## 2022-03-31 NOTE — Progress Notes (Signed)
This surgery was originally scheduled at an outpatient surgery center earlier this week, however, according to the surgeon (we do not have records from the case) became hypotensive on induction (unresponsive to pressors and the case was aborted. Dr. Marcello Moores moved the case to [facility] and ordered a preop echo to be done on morning of surgery with expedited read requested.  Of note, the patient was evaluated by his PCP Dr. Scarlette Calico on 01/30/22 for several month history of chest pain described as "twinges" at rest. He denied any exertional symptoms, no DOE or chest pain with activity. He has a hx of paroxysmal afib, not on anticoagulation. Dr. Ronnald Ramp notes he has good rate/rhythm control. "EKG and labs were noted to be reassuring." Exercise tolerance test was ordered, however, due to pt's orthopedic limitations, this was switched to a CT coronary calcium score. CT showed total calcium score of 41.7 which is at percentile 44 for subjects of the same age, gender, and race/ethnicity. Dr. Ronnald Ramp subsequently cleared the pt for surgery as moderate risk.   I spoke w/ Dr. Marcello Moores and the patient that I do not feel it is safe to proceed with general anesthesia without a stress test, given his multiple month history of chest pain in the setting of multiple medical comorbidities including Afib and a longstanding smoking history/COPD, especially after his reaction to induction at the surgery center. I communicated to his PCP that if exercise stress testing is unable to be performed that a pharmacologic one should be pursued. Pt will still have his preop echo completed today.  Ideally, before rescheduling, pt should have cardiology workup and records from surgery center anesthetic obtained.

## 2022-03-31 NOTE — Anesthesia Preprocedure Evaluation (Signed)
Anesthesia Evaluation    Reviewed: Allergy & Precautions, Patient's Chart, lab work & pertinent test results, reviewed documented beta blocker date and time   Airway        Dental   Pulmonary asthma , sleep apnea , COPD, former smoker Former smoker, quit 2015, 64 pack year history           Cardiovascular hypertension, Pt. on medications and Pt. on home beta blockers + dysrhythmias Atrial Fibrillation      Neuro/Psych  PSYCHIATRIC DISORDERS Anxiety Depression       GI/Hepatic Neg liver ROS,GERD  Controlled and Medicated,,  Endo/Other  negative endocrine ROS    Renal/GU negative Renal ROS  negative genitourinary   Musculoskeletal  (+) Arthritis , Osteoarthritis,    Abdominal   Peds  Hematology negative hematology ROS (+)   Anesthesia Other Findings   Reproductive/Obstetrics negative OB ROS                              Anesthesia Physical Anesthesia Plan  ASA:   Anesthesia Plan:    Post-op Pain Management:    Induction:   PONV Risk Score and Plan:   Airway Management Planned:   Additional Equipment:   Intra-op Plan:   Post-operative Plan:   Informed Consent:   Plan Discussed with:   Anesthesia Plan Comments: (evaluated by his PCP Dr. Scarlette Calico on 01/30/22 for several month history of chest pain described as "twinges" at rest. He denied any exertional symptoms, no DOE or chest pain with activity. He has a hx of paroxysmal afib, not on anticoagulation. Dr. Ronnald Ramp notes he has good rate/rhythm control. EKG and labs were noted to be reassuring. Exercise tolerance test was ordered, however, due to pt's orthopedic limitations, this was switched to a CT coronary calcium score. CT showed total calcium score of 41.7 which is at percentile 44 for subjects of the same age, gender, and race/ethnicity. Dr. Ronnald Ramp subsequently cleared the pt for surgery as moderate risk  Surgery originally  scheduled at surgery center earlier this week, however, pt became hypotensive on induction and case was aborted. Dr. Marcello Moores moved case to [facility] and ordered echo. Echo to be done on morning of surgery with expedited read requested.   I spoke w/ Dr. Marcello Moores and the patient that I do not feel it is safe to proceed with general anesthesia without a stress test, given his multiple month history of chest pain in the setting of multiple medical comorbidities including Afib and a longstanding smoking history/COPD, especially after his reaction to induction at the surgery center. I communicated to his PCP that if exercise stress testing is unable to be performed that a pharmacologic one should be pursued. Pt will still have his preop echo completed today.  Ideally, before rescheduling, pt should have cardiology workup and records from surgery center anesthetic obtained. )         Anesthesia Quick Evaluation

## 2022-04-05 ENCOUNTER — Other Ambulatory Visit: Payer: Self-pay | Admitting: Internal Medicine

## 2022-04-05 ENCOUNTER — Telehealth: Payer: Self-pay | Admitting: Internal Medicine

## 2022-04-05 NOTE — Telephone Encounter (Signed)
Patient called and states that he needs a stress test scheduled because his surgeon will not do his surgery until he has one.  It must be scheduled fairly quickly so he can do his surgery.  Patient #:  [id number]

## 2022-04-05 NOTE — Telephone Encounter (Signed)
Stress test needed as soon as possible before surgery can be scheduled.

## 2022-04-06 ENCOUNTER — Encounter: Payer: Self-pay | Admitting: Internal Medicine

## 2022-04-07 ENCOUNTER — Other Ambulatory Visit: Payer: Self-pay | Admitting: Internal Medicine

## 2022-04-07 DIAGNOSIS — I10 Essential (primary) hypertension: Secondary | ICD-10-CM

## 2022-04-11 ENCOUNTER — Encounter: Payer: Self-pay | Admitting: Family Medicine

## 2022-04-11 ENCOUNTER — Ambulatory Visit: Payer: Commercial Managed Care - HMO | Admitting: Family Medicine

## 2022-04-11 ENCOUNTER — Other Ambulatory Visit: Payer: Self-pay | Admitting: Internal Medicine

## 2022-04-11 VITALS — BP 158/94 | HR 93 | Ht 71.0 in | Wt 211.0 lb

## 2022-04-11 DIAGNOSIS — R079 Chest pain, unspecified: Secondary | ICD-10-CM | POA: Diagnosis not present

## 2022-04-11 DIAGNOSIS — M19011 Primary osteoarthritis, right shoulder: Secondary | ICD-10-CM

## 2022-04-11 DIAGNOSIS — M19012 Primary osteoarthritis, left shoulder: Secondary | ICD-10-CM | POA: Diagnosis not present

## 2022-04-11 DIAGNOSIS — M19019 Primary osteoarthritis, unspecified shoulder: Secondary | ICD-10-CM | POA: Insufficient documentation

## 2022-04-11 DIAGNOSIS — M5010 Cervical disc disorder with radiculopathy, unspecified cervical region: Secondary | ICD-10-CM | POA: Diagnosis not present

## 2022-04-11 DIAGNOSIS — M542 Cervicalgia: Secondary | ICD-10-CM

## 2022-04-11 DIAGNOSIS — I480 Paroxysmal atrial fibrillation: Secondary | ICD-10-CM

## 2022-04-11 NOTE — Telephone Encounter (Signed)
If clearance request was faxed to A-fib, it has not been entered in. I see that the pt is being referred to Gen card for pre op clearance. I will need surgeon office to fax over a clearance request. Pt will need a new pt appt in office.       Pre-operative Risk Assessment    Patient Name: John [name]  DOB: 09/25/1955 MRN: [id number]     PT IS BEEN REFERRED TO GEN CARD. PT WILL NEED A NEW PT APPT FOR PRE OP CLEARANCE. WILL SEND TO CHART PREP. I WILL SEE IF SCHEDULING MAY ASSIST IN MAKING A NEW PT APPT.  Request for Surgical Clearance    Procedure:   ACDF C5-6  Date of Surgery:  Clearance TBD                                 Surgeon:  DR. Duffy Rhody Surgeon's Group or Practice Name:  [facility]  Phone number:  [phone] Fax number:  [phone]   Type of Clearance Requested:   - Medical ; NO MEDICATIONS LISTED AS NEEDING TO BE HELD   Type of Anesthesia:  General    Additional requests/questions:    John [name]   04/11/2022, 3:05 PM

## 2022-04-11 NOTE — Telephone Encounter (Signed)
Pt has new pt appt 04/13/22 with Dr. Martinique.

## 2022-04-11 NOTE — Patient Instructions (Addendum)
Injected both AC jts today Referral to cardiology pain mgnt referral  Write [place] in [place] on progress

## 2022-04-11 NOTE — Assessment & Plan Note (Signed)
Bilateral injections given today.  Will see if this is contributing to some of the arm pain that he is having.  We discussed with patient about icing regimen and home exercises.  Discussed try to keep hands within peripheral vision.  Discussed scapular strengthening exercises.  Follow-up with me again in 6 to 8 weeks.

## 2022-04-11 NOTE — Progress Notes (Signed)
[facility] [place] Phone: [phone] Subjective:   [name], am serving as a scribe for Dr. Hulan Saas.  I'm seeing this patient by the request  of:  Janith Lima, MD  CC: Neck pain follow-up  KAJ:[id number]  08/01/2021 Patient responded extremely well to the cervical injection still.  Patient is under the timeline where patient will have the surgical intervention on the neck in December.  Patient needs to wait until he can have coverage for his dizziness and then he should be able to take care of himself.  Discussed icing regimen and home exercises, increase activity slowly.  Follow-up with me as needed and can write [place] when he needs repeat of the injection      Update 04/11/2022 TAHJAI SCHETTER is a 67 y.o. male coming in with complaint of cervical spine pain. Last epidural October 2023. Patient states that he was suppose to have surgery on the 19th but they did not perform surgery due to a drop in his BP. Was suppose to have surgery on a diff date but the anesthesiologist would not do surgery without a stress test. Here to try to try to get through another year without surgery as he has to have it by the 12th of January. Waiting to hear from cardiology. Also c/o pain in both shoulders over traps muscles.  Has discussed with primary care about a chest discomfort prior to surgery.  Past medical history though is significant for atrial fibrillation as well as COPD.  Please see the anesthesiology note for why they discussed discontinuing the surgery.  This is in epic.    Past Medical History:  Diagnosis Date   Afib ([place])    Anxiety and depression    Asthma    Emphysema of lung ([facility])    GERD (gastroesophageal reflux disease)    Hernia    right inguinal   History of bronchitis    Hyperlipidemia    Hypertension    Morton's neuroma    OA (osteoarthritis)    neck   Obstructive sleep apnea on CPAP    Pancreatitis     Torn meniscus    Left   Past Surgical History:  Procedure Laterality Date   BONE CYST EXCISION  1991   left wrist   COLONOSCOPY     HERNIA REPAIR  04/14/11   right inguinal   HERNIA REPAIR     left inguinal, december 2020   Social History   Socioeconomic History   Marital status: Divorced    Spouse name: Not on file   Number of children: Not on file   Years of education: Not on file   Highest education level: Not on file  Occupational History   Not on file  Tobacco Use   Smoking status: Former    Packs/day: 1.50    Years: 38.00    Total pack years: 57.00    Types: Cigarettes    Quit date: 2015    Years since quitting: 9.0   Smokeless tobacco: Never  Vaping Use   Vaping Use: Every day  Substance and Sexual Activity   Alcohol use: Not Currently   Drug use: No   Sexual activity: Yes    Partners: Female  Other Topics Concern   Not on file  Social History Narrative   Not on file   Social Determinants of Health   Financial Resource Strain: Not on file  Food Insecurity: Not on  file  Transportation Needs: Not on file  Physical Activity: Not on file  Stress: Not on file  Social Connections: Not on file   No Known Allergies Family History  Problem Relation Age of Onset   Hypertension Mother    Dementia Other    Hypertension Sister    Hypertension Brother     Current Outpatient Medications (Endocrine & Metabolic):    methylPREDNISolone (MEDROL DOSEPAK) 4 MG TBPK tablet, 4 pills day 1, 3 pills day 2-3, 2 pills day 4-7, 1 pill day 8-10, 1/2 pill thereafter until gone   testosterone (ANDROGEL) 50 MG/5GM (1%) GEL, Place 5 g onto the skin daily. (Patient taking differently: Place 5 g onto the skin in the morning.)  Current Outpatient Medications (Cardiovascular):    carvedilol (COREG) 3.125 MG tablet, TAKE 1 TABLET(3.125 MG) BY MOUTH TWICE DAILY WITH A MEAL   diltiazem (CARDIZEM CD) 120 MG 24 hr capsule, TAKE 1 CAPSULE(120 MG) BY MOUTH DAILY (Patient taking  differently: Take 120 mg by mouth every evening.)   diltiazem (CARDIZEM) 30 MG tablet, TAKE 1 TABLET BY MOUTH EVERY 4 HOURS AS NEEDED HR ABOVE 100 AND TOP NUMBER B/P GREATER THAN 100   olmesartan (BENICAR) 40 MG tablet, TAKE 1 TABLET(40 MG) BY MOUTH DAILY   rosuvastatin (CRESTOR) 20 MG tablet, Take 1 tablet (20 mg total) by mouth daily. (Patient taking differently: Take 20 mg by mouth every evening.)   sildenafil (VIAGRA) 100 MG tablet, Take 1 tablet (100 mg total) by mouth daily as needed for erectile dysfunction. (Patient taking differently: Take 25 mg by mouth daily as needed for erectile dysfunction.)  Current Outpatient Medications (Respiratory):    BREO ELLIPTA 100-25 MCG/ACT AEPB, Inhale 1 puff into the lungs daily.   Budeson-Glycopyrrol-Formoterol (BREZTRI AEROSPHERE) 160-9-4.8 MCG/ACT AERO, Inhale 2 puffs into the lungs in the morning and at bedtime.   fluticasone (FLONASE) 50 MCG/ACT nasal spray, Place 2 sprays into both nostrils in the morning and at bedtime.   Fluticasone-Umeclidin-Vilant (TRELEGY ELLIPTA) 100-62.5-25 MCG/ACT AEPB, Inhale 1 puff into the lungs daily. (Patient taking differently: Inhale 1 puff into the lungs every evening.)   ipratropium (ATROVENT) 0.06 % nasal spray, Place 1 spray into both nostrils 2 (two) times daily. (Patient taking differently: Place 1 spray into both nostrils in the morning.)   montelukast (SINGULAIR) 10 MG tablet, TAKE 1 TABLET(10 MG) BY MOUTH DAILY  Current Outpatient Medications (Analgesics):    meloxicam (MOBIC) 15 MG tablet, TAKE 1 TABLET(15 MG) BY MOUTH DAILY   traMADol (ULTRAM) 50 MG tablet, Take 1 tablet (50 mg total) by mouth 2 (two) times daily. (Patient taking differently: Take 50 mg by mouth in the morning.)  Current Outpatient Medications (Hematological):    vitamin B-12 (CYANOCOBALAMIN) 500 MCG tablet, Take 500 mcg by mouth in the morning.  Current Outpatient Medications (Other):    CHELATED MAGNESIUM PO, Take 200 mg by mouth in  the morning.   cholecalciferol (VITAMIN D3) 25 MCG (1000 UT) tablet, Take 1,000 Units by mouth every evening.   Coenzyme Q10 (CO Q 10 PO), Take 400 mg by mouth in the morning.   esomeprazole (NEXIUM) 20 MG capsule, Take 1 capsule (20 mg total) by mouth daily at 12 noon. (Patient taking differently: Take 20 mg by mouth every evening.)   Flaxseed, Linseed, (FLAX SEED OIL PO), Take 1 tablet by mouth every morning.   Multiple Vitamins-Minerals (MULTIVITAMIN WITH MINERALS) tablet, Take 1 tablet by mouth in the morning.   Omega-3 Fatty Acids (FISH OIL)  1200 MG CAPS, Take 1 capsule by mouth in the morning and at bedtime.   Prasterone, DHEA, (DHEA PO), Take 150 mg by mouth every evening.   TURMERIC PO, Take 800 mg by mouth 2 (two) times daily.   vitamin E 180 MG (400 UNITS) capsule, Take 400 Units by mouth every evening.   Reviewed prior external information including notes and imaging from  primary care provider As well as notes that were available from care everywhere and other healthcare systems.  Past medical history, social, surgical and family history all reviewed in electronic medical record.  No pertanent information unless stated regarding to the chief complaint.   Review of Systems:  No headache, visual changes, nausea, vomiting, diarrhea, constipation, dizziness, abdominal pain, skin rash, fevers, chills, night sweats, weight loss, swollen lymph nodes,  joint swelling, chest pain, shortness of breath, mood changes. POSITIVE muscle aches, body aches  Objective  Blood pressure (!) 158/94, pulse 93, height '5\' 11"'$  (1.803 m), weight 211 lb (95.7 kg), SpO2 98 %.   General: No apparent distress alert and oriented x3 mood and affect normal, dressed appropriately.  HEENT: Pupils equal, extraocular movements intact  Respiratory: Patient's speak in full sentences and does not appear short of breath  Cardiovascular: No lower extremity edema, non tender, no erythema  Patient's neck does have some  loss lordosis.  Severe tenderness to palpation over the acromioclavicular joints bilaterally.  After verbal consent patient was prepped with alcohol swab and with a 25-gauge half inch needle injected in the right acromioclavicular joint with a total of 0.5 cc of 0.5% Marcaine and 0.5 cc of Kenalog 40 mg/mL.  No blood loss, Band-Aid placed.  Postinjection instructions given  After verbal consent patient was prepped with alcohol swab and with a 25-gauge half inch needle injected into the left acromioclavicular joint with 0.5 cc of 0.5% Marcaine and 0.5 cc of Kenalog 40 mg/mL.  Minimal blood loss.  Band-Aid placed.  Postinjection instructions given    Impression and Recommendations:    The above documentation has been reviewed and is accurate and complete Lyndal Pulley, DO

## 2022-04-11 NOTE — Assessment & Plan Note (Signed)
Significant arthritic changes noted.  Patient may not be able to actually have the surgery this year unless patient does get his stress test and gets approval for surgery in the near future.  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  Will refer patient over to pain management in the interim to see if low-dose naltrexone would work in case this would have to wait for a year for surgical intervention.  Will place referral for the cardiovascular referral for the stress test as well.  Follow-up with me again when needed if epidurals are necessary.  Continue and encourage patient to continue the exercises.

## 2022-04-12 NOTE — Progress Notes (Deleted)
Cardiology Office Note:    Date:  04/12/2022   ID:  John [name], DOB 1956/02/28, MRN [id number]  PCP:  Janith Lima, MD   [place] Providers Cardiologist:  None { Click to update primary MD,subspecialty MD or APP then REFRESH:1}    Referring MD: Janith Lima, MD   No chief complaint on file. ***  History of Present Illness:    John [name] is a 67 y.o. male seen at the request of Dr Ronnald Ramp for evaluation of paroxysmal Afib and pre op clearance for cervical spine surgery. . He has a history of HTN and HLD. Has OSA and is on CPAP. He was initially seen in 2018 for new Afib. Was anticoagulated and had subsequent DCCV. Followed in Afib clinic. Initially treated with Multaq but has since discontinued this. Uses it only when gets steroid injections which seem to be a trigger for him. Does have osteoarthritis and takes NSAIDs daily. Recent Echo was unremarkable. Last seen in Afib clinic in June 2023.   Past Medical History:  Diagnosis Date   Afib ([place])    Anxiety and depression    Asthma    Emphysema of lung ([place])    GERD (gastroesophageal reflux disease)    Hernia    right inguinal   History of bronchitis    Hyperlipidemia    Hypertension    Morton's neuroma    OA (osteoarthritis)    neck   Obstructive sleep apnea on CPAP    Pancreatitis    Torn meniscus    Left    Past Surgical History:  Procedure Laterality Date   BONE CYST EXCISION  1991   left wrist   COLONOSCOPY     HERNIA REPAIR  04/14/11   right inguinal   HERNIA REPAIR     left inguinal, december 2020    Current Medications: No outpatient medications have been marked as taking for the 04/13/22 encounter (Appointment) with [place], Cataleah Stites M, MD.     Allergies:   Patient has no known allergies.   Social History   Socioeconomic History   Marital status: Divorced    Spouse name: Not on file   Number of children: Not on file   Years of education: Not on file   Highest education level: Not on  file  Occupational History   Not on file  Tobacco Use   Smoking status: Former    Packs/day: 1.50    Years: 38.00    Total pack years: 57.00    Types: Cigarettes    Quit date: 2015    Years since quitting: 9.0   Smokeless tobacco: Never  Vaping Use   Vaping Use: Every day  Substance and Sexual Activity   Alcohol use: Not Currently   Drug use: No   Sexual activity: Yes    Partners: Female  Other Topics Concern   Not on file  Social History Narrative   Not on file   Social Determinants of Health   Financial Resource Strain: Not on file  Food Insecurity: Not on file  Transportation Needs: Not on file  Physical Activity: Not on file  Stress: Not on file  Social Connections: Not on file     Family History: The patient's ***family history includes Dementia in an other family member; Hypertension in his brother, mother, and sister.  ROS:   Please see the history of present illness.    *** All other systems reviewed and are negative.  EKGs/Labs/Other Studies Reviewed:  The following studies were reviewed today: Echo 03/31/22: IMPRESSIONS     1. Left ventricular ejection fraction, by estimation, is 60 to 65%. The  left ventricle has normal function. The left ventricle has no regional  wall motion abnormalities. Left ventricular diastolic parameters are  consistent with Grade I diastolic  dysfunction (impaired relaxation).   2. Right ventricular systolic function is normal. The right ventricular  size is normal. There is mildly elevated pulmonary artery systolic  pressure.   3. No evidence of mitral valve regurgitation.   4. The aortic valve is tricuspid. There is mild calcification of the  aortic valve. Aortic valve regurgitation is not visualized.   5. Aneurysm of the ascending aorta, measuring 40 mm.   6. The inferior vena cava is normal in size with greater than 50%  respiratory variability, suggesting right atrial pressure of 3 mmHg.   Comparison(s): No  prior Echocardiogram.   EKG:  EKG is *** ordered today.  The ekg ordered today demonstrates ***  Recent Labs: 05/16/2021: ALT 29; TSH 1.12 01/30/2022: BUN 20; Creatinine, Ser 0.94; Hemoglobin 15.0; Platelets 171.0; Potassium 4.3; Sodium 138  Recent Lipid Panel    Component Value Date/Time   CHOL 225 (H) 05/16/2021 1202   TRIG 127.0 05/16/2021 1202   HDL 35.20 (L) 05/16/2021 1202   CHOLHDL 6 05/16/2021 1202   VLDL 25.4 05/16/2021 1202   LDLCALC 164 (H) 05/16/2021 1202   LDLCALC 165 (H) 05/02/2019 1432     Risk Assessment/Calculations:   {Does this patient have ATRIAL FIBRILLATION?:[phone]}            Physical Exam:    VS:  There were no vitals taken for this visit.    Wt Readings from Last 3 Encounters:  04/11/22 211 lb (95.7 kg)  01/30/22 210 lb 8 oz (95.5 kg)  10/06/21 205 lb (93 kg)     GEN: *** Well nourished, well developed in no acute distress HEENT: Normal NECK: No JVD; No carotid bruits LYMPHATICS: No lymphadenopathy CARDIAC: ***RRR, no murmurs, rubs, gallops RESPIRATORY:  Clear to auscultation without rales, wheezing or rhonchi  ABDOMEN: Soft, non-tender, non-distended MUSCULOSKELETAL:  No edema; No deformity  SKIN: Warm and dry NEUROLOGIC:  Alert and oriented x 3 PSYCHIATRIC:  Normal affect   ASSESSMENT:    No diagnosis found. PLAN:    In order of problems listed above:  ***      {Are you ordering a CV Procedure (e.g. stress test, cath, DCCV, TEE, etc)?   Press F2        :[id number]}    Medication Adjustments/Labs and Tests Ordered: Current medicines are reviewed at length with the patient today.  Concerns regarding medicines are outlined above.  No orders of the defined types were placed in this encounter.  No orders of the defined types were placed in this encounter.   There are no Patient Instructions on file for this visit.   Signed, Briunna Leicht Martinique, MD  04/12/2022 11:23 AM    [place]

## 2022-04-13 ENCOUNTER — Encounter: Payer: Self-pay | Admitting: Physical Medicine and Rehabilitation

## 2022-04-13 ENCOUNTER — Ambulatory Visit: Payer: Commercial Managed Care - HMO | Admitting: Cardiology

## 2022-04-25 ENCOUNTER — Other Ambulatory Visit (HOSPITAL_COMMUNITY): Payer: Self-pay

## 2022-04-25 ENCOUNTER — Encounter (HOSPITAL_BASED_OUTPATIENT_CLINIC_OR_DEPARTMENT_OTHER): Payer: Self-pay | Admitting: Cardiology

## 2022-04-25 ENCOUNTER — Ambulatory Visit (HOSPITAL_BASED_OUTPATIENT_CLINIC_OR_DEPARTMENT_OTHER): Payer: Commercial Managed Care - HMO | Admitting: Cardiology

## 2022-04-25 VITALS — BP 130/78 | HR 91 | Ht 71.0 in | Wt 202.9 lb

## 2022-04-25 DIAGNOSIS — Z01810 Encounter for preprocedural cardiovascular examination: Secondary | ICD-10-CM

## 2022-04-25 DIAGNOSIS — R079 Chest pain, unspecified: Secondary | ICD-10-CM | POA: Diagnosis not present

## 2022-04-25 DIAGNOSIS — I480 Paroxysmal atrial fibrillation: Secondary | ICD-10-CM

## 2022-04-25 DIAGNOSIS — I10 Essential (primary) hypertension: Secondary | ICD-10-CM

## 2022-04-25 DIAGNOSIS — Z7189 Other specified counseling: Secondary | ICD-10-CM

## 2022-04-25 MED ORDER — IVABRADINE HCL 5 MG PO TABS
15.0000 mg | ORAL_TABLET | Freq: Once | ORAL | 0 refills | Status: AC
  Filled 2022-04-25: qty 3, 1d supply, fill #0

## 2022-04-25 NOTE — Progress Notes (Addendum)
Cardiology Office Note:    Date:  04/26/2022   ID:  WASHINGTON WHEDBEE, DOB [birth year], MRN [id number]  PCP:  Janith Lima, MD  Cardiologist:  Buford Dresser, MD  Referring MD: Janith Lima, MD   CC:  New patient evaluation for preoperative clearance  History of Present Illness:    John [name] is a 67 y.o. male with a hx of paroxysmal atrial fibrillation, hypertension, hyperlipidemia, Morton's neuroma, bronchitis, asthma, COPD, GERD, osteoarthritis, obstructive sleep apnea on CPAP, and pancreatitis, who is seen as a new consult for preoperative clearance and stress test.  Previously seen by his PCP Dr. Scarlette Calico on 01/30/22 for several month history of chest pain described as "twinges" at rest. Hx of paroxysmal afib, not on anticoagulation; good rate/rhythm control. An ETT was ordered but not performed due to orthopedic limitations. CT calcium score was performed instead showing a coronary calcium score of 41.7. He was cleared for surgery at that time as moderate risk.   Originally scheduled for anterior cervical discectomy and fusion (ACDF C5-6) 03/28/22 which was initially cancelled due to hypotension on induction (unresponsive to pressors and the case was aborted). Dr. Marcello Moores moved the case to [facility] and ordered preop echocardiogram on the morning of surgery 03/31/2022. Per anesthesiology it was not felt to be safe to proceed with general anesthesia without a stress test given his history of chest pain, comorbidities, and prior reaction to induction.  Planned surgery: Anterior cervical discectomy and fusion (ACDF C5-6)  Pertinent past cardiac history: None aside from atrial fibrillation. Prior cardiac workup: Echocardiogram 03/2022 revealed LVEF 60-65% with grade 1 diastolic dysfunction. Ascending aortic aneurysm measured at 40 mm. CT 02/2022 showed coronary calcium score of 41.7 with calcifications of the LAD. History of valve disease: none History of CAD/PAD/CVA/TIA:   CAD. History of heart failure: none History of arrhythmia: Atrial fibrillation. Two times required cardioversion, both times he notes was in the setting of titration off prednisone. He notes his heart rate has always been higher throughout his life. Most recently, he had a 12 hour arrhythmic episode last June/July that was distinct from his Afib. On anticoagulation:  No. History of hypertension: Yes. History of OSA: +Obstructive sleep apnea on CPAP. History of anesthesia complications: Yes, became hypotensive on induction.  Current symptoms: Once in a while he experiences a strange chest discomfort localized in a small, vertical line just to the right of his sternum. He denies soreness on palpation. Additionally he complains of a loss of sensation on the top of his feet. While using a hairdryer, he was able to feel the air on his feet, but not he heat. Functional capacity: He states he is typically an active person.  Reportedly he was given a large dose of gabapentin prior to his surgery, which caused him to feel drowsy and have surreal dreams. He recalls labile blood pressures ranging from 409W to 119J systolic while in clinic at that time. In clinic today his blood pressure is 130/78, which he states is the lowest it has been in the clinical setting.   He has been receiving therapeutic injections of kenalog every 2 months or so. Unfortunately he states it is too late to have surgery this year.  He is a former smoker for 38 years.  He denies any palpitations, or peripheral edema. No lightheadedness, headaches, syncope, orthopnea, or PND.  Past Medical History:  Diagnosis Date   Afib ([place])    Anxiety and depression    Asthma  Emphysema of lung (HCC)    GERD (gastroesophageal reflux disease)    Hernia    right inguinal   History of bronchitis    Hyperlipidemia    Hypertension    Morton's neuroma    OA (osteoarthritis)    neck   Obstructive sleep apnea on CPAP    Pancreatitis     Torn meniscus    Left    Past Surgical History:  Procedure Laterality Date   BONE CYST EXCISION  1991   left wrist   COLONOSCOPY     HERNIA REPAIR  04/14/11   right inguinal   HERNIA REPAIR     left inguinal, december 2020    Current Medications: Current Outpatient Medications on File Prior to Visit  Medication Sig   BREO ELLIPTA 100-25 MCG/ACT AEPB Inhale 1 puff into the lungs daily.   carvedilol (COREG) 3.125 MG tablet TAKE 1 TABLET(3.125 MG) BY MOUTH TWICE DAILY WITH A MEAL   CHELATED MAGNESIUM PO Take 200 mg by mouth in the morning.   cholecalciferol (VITAMIN D3) 25 MCG (1000 UT) tablet Take 1,000 Units by mouth every evening.   Coenzyme Q10 (CO Q 10 PO) Take 400 mg by mouth in the morning.   diltiazem (CARDIZEM CD) 120 MG 24 hr capsule TAKE 1 CAPSULE(120 MG) BY MOUTH DAILY (Patient taking differently: Take 120 mg by mouth every evening.)   diltiazem (CARDIZEM) 30 MG tablet TAKE 1 TABLET BY MOUTH EVERY 4 HOURS AS NEEDED HR ABOVE 100 AND TOP NUMBER B/P GREATER THAN 100   esomeprazole (NEXIUM) 20 MG capsule Take 1 capsule (20 mg total) by mouth daily at 12 noon. (Patient taking differently: Take 20 mg by mouth every evening.)   Flaxseed, Linseed, (FLAX SEED OIL PO) Take 1 tablet by mouth every morning.   fluticasone (FLONASE) 50 MCG/ACT nasal spray Place 2 sprays into both nostrils in the morning and at bedtime.   ipratropium (ATROVENT) 0.06 % nasal spray Place 1 spray into both nostrils 2 (two) times daily. (Patient taking differently: Place 1 spray into both nostrils in the morning.)   meloxicam (MOBIC) 15 MG tablet TAKE 1 TABLET(15 MG) BY MOUTH DAILY   methylPREDNISolone (MEDROL DOSEPAK) 4 MG TBPK tablet 4 pills day 1, 3 pills day 2-3, 2 pills day 4-7, 1 pill day 8-10, 1/2 pill thereafter until gone   montelukast (SINGULAIR) 10 MG tablet TAKE 1 TABLET(10 MG) BY MOUTH DAILY   Multiple Vitamins-Minerals (MULTIVITAMIN WITH MINERALS) tablet Take 1 tablet by mouth in the morning.    olmesartan (BENICAR) 40 MG tablet TAKE 1 TABLET(40 MG) BY MOUTH DAILY   Omega-3 Fatty Acids (FISH OIL) 1200 MG CAPS Take 1 capsule by mouth in the morning and at bedtime.   Prasterone, DHEA, (DHEA PO) Take 150 mg by mouth every evening.   rosuvastatin (CRESTOR) 20 MG tablet Take 1 tablet (20 mg total) by mouth daily. (Patient taking differently: Take 20 mg by mouth every evening.)   sildenafil (VIAGRA) 25 MG tablet Take 25 mg by mouth daily as needed for erectile dysfunction.   testosterone (ANDROGEL) 50 MG/5GM (1%) GEL Place 5 g onto the skin daily. (Patient taking differently: Place 5 g onto the skin in the morning.)   traMADol (ULTRAM) 50 MG tablet Take 1 tablet (50 mg total) by mouth 2 (two) times daily. (Patient taking differently: Take 50 mg by mouth in the morning.)   TURMERIC PO Take 800 mg by mouth 2 (two) times daily.   vitamin B-12 (CYANOCOBALAMIN)  500 MCG tablet Take 500 mcg by mouth in the morning.   vitamin E 180 MG (400 UNITS) capsule Take 400 Units by mouth every evening.   No current facility-administered medications on file prior to visit.     Allergies:   Patient has no known allergies.   Social History   Tobacco Use   Smoking status: Former    Packs/day: 1.50    Years: 38.00    Total pack years: 57.00    Types: Cigarettes    Quit date: 2015    Years since quitting: 9.0   Smokeless tobacco: Never  Vaping Use   Vaping Use: Every day  Substance Use Topics   Alcohol use: Not Currently   Drug use: No    Family History: family history includes Dementia in an other family member; Hypertension in his brother, mother, and sister.  ROS:   Please see the history of present illness.  Additional pertinent ROS: Constitutional: Negative for chills, fever, night sweats, unintentional weight loss  HENT: Negative for ear pain and hearing loss.   Eyes: Negative for loss of vision and eye pain.  Respiratory: Negative for sputum, wheezing.   Cardiovascular: See  HPI. Gastrointestinal: Negative for abdominal pain, melena, and hematochezia.  Genitourinary: Negative for dysuria and hematuria.  Musculoskeletal: Negative for falls.  Skin: Negative for itching and rash.  Neurological: Negative for focal weakness, focal sensory changes and loss of consciousness.  Endo/Heme/Allergies: Does not bruise/bleed easily.     EKGs/Labs/Other Studies Reviewed:    The following studies were reviewed today:  Echo  03/31/2022: Sonographer Comments: Image acquisition challenging due to respiratory  motion. Global longitudinal strain was attempted.   IMPRESSIONS   1. Left ventricular ejection fraction, by estimation, is 60 to 65%. The  left ventricle has normal function. The left ventricle has no regional  wall motion abnormalities. Left ventricular diastolic parameters are  consistent with Grade I diastolic  dysfunction (impaired relaxation).   2. Right ventricular systolic function is normal. The right ventricular  size is normal. There is mildly elevated pulmonary artery systolic  pressure.   3. No evidence of mitral valve regurgitation.   4. The aortic valve is tricuspid. There is mild calcification of the  aortic valve. Aortic valve regurgitation is not visualized.   5. Aneurysm of the ascending aorta, measuring 40 mm.   6. The inferior vena cava is normal in size with greater than 50%  respiratory variability, suggesting right atrial pressure of 3 mmHg.   Comparison(s): No prior Echocardiogram.   CT Cardiac Scoring  02/13/2022: FINDINGS: CORONARY CALCIUM SCORES:   Left Main: 0   LAD: 41.7   LCx: 0   RCA: 0   Total Agatston Score: 41.7   MESA database percentile: 44   AORTA MEASUREMENTS:   Ascending Aorta: 3.8 cm   Descending Aorta:2.9 cm   OTHER FINDINGS:   Vascular: Normal heart size. No pericardial effusion. Normal caliber thoracic aorta with mild calcified plaque.   Mediastinum/Nodes: Esophagus is unremarkable. No  pathologically enlarged lymph nodes seen in the chest.   Lungs/Pleura: Clustered small solid nodules of the left lower lobe with adjacent ground-glass opacity. Largest discrete solid nodule measures 3 mm on series 9, image 41.   Upper Abdomen: No acute abnormality.   Musculoskeletal: No chest wall mass or suspicious bone lesions identified.   IMPRESSION: 1. Total calcium score of 41.7 is at percentile 44 for subjects of the same age, gender, and race/ethnicity. 2. Clustered small solid nodules of  the left lower lobe with adjacent ground-glass opacity, findings are most likely due to infection or aspiration. Largest nodule measures 8 mm. No follow-up needed if patient is low-risk (and has no known or suspected primary neoplasm). Non-contrast chest CT can be considered in 12 months if patient is high-risk. This recommendation follows the consensus statement: Guidelines for Management of Incidental Pulmonary Nodules Detected on CT Images: From the Fleischner Society 2017; Radiology 2017; 284:228-243. 3. Aortic Atherosclerosis (ICD10-I70.0).  EKG:  EKG is personally reviewed.   04/25/2022:  NSR at 91 bpm  Recent Labs: 05/16/2021: ALT 29; TSH 1.12 01/30/2022: BUN 20; Creatinine, Ser 0.94; Hemoglobin 15.0; Platelets 171.0; Potassium 4.3; Sodium 138   Recent Lipid Panel    Component Value Date/Time   CHOL 225 (H) 05/16/2021 1202   TRIG 127.0 05/16/2021 1202   HDL 35.20 (L) 05/16/2021 1202   CHOLHDL 6 05/16/2021 1202   VLDL 25.4 05/16/2021 1202   LDLCALC 164 (H) 05/16/2021 1202   LDLCALC 165 (H) 05/02/2019 1432    Physical Exam:    VS:  BP 130/78 (BP Location: Right Arm, Patient Position: Sitting, Cuff Size: Large)   Pulse 91   Ht '5\' 11"'$  (1.803 m)   Wt 202 lb 14.4 oz (92 kg)   BMI 28.30 kg/m     Wt Readings from Last 3 Encounters:  04/25/22 202 lb 14.4 oz (92 kg)  04/11/22 211 lb (95.7 kg)  01/30/22 210 lb 8 oz (95.5 kg)    GEN: Well nourished, well developed in no  acute distress HEENT: Normal, moist mucous membranes NECK: No JVD CARDIAC: regular rhythm, normal S1 and S2, no rubs or gallops. No murmur. VASCULAR: Radial and DP pulses 2+ bilaterally. No carotid bruits RESPIRATORY:  Clear to auscultation without rales, wheezing or rhonchi  ABDOMEN: Soft, non-tender, non-distended MUSCULOSKELETAL:  Ambulates independently SKIN: Warm and dry, no edema NEUROLOGIC:  Alert and oriented x 3. No focal neuro deficits noted. PSYCHIATRIC:  Normal affect    ASSESSMENT:    1. Chest pain of uncertain etiology   2. Primary hypertension   3. PAF (paroxysmal atrial fibrillation) ([place])   4. Preop cardiovascular exam   5. Cardiac risk counseling   6. Counseling on health promotion and disease prevention    PLAN:    Preoperative cardiovascular evaluation Chest pain -it is unclear to me what happened during induction during his first procedure on 03/28/22. He plans to personally get records. He does note that he received gabapentin prior to surgery, which he has had issues with in the past, and he reports already feeling "different" prior to induction -echo unremarkable, ECG unremarkable -chest pain is atypical, but given concern, best option is an anatomic test over a nuclear stress test. This will give [place] full understanding of his coronary anatomy and potential risk. Based on this, discussed cardiac CT. He is amenable.  -given borderline BP and that he is already on beta blocker and calcium channel blocker, will use ivabradine for rate control so that he will have BP room for nitro  Paroxysmal atrial fibrillation -denies recent events -chadsvasc 1 currently, will be 2 after his next birthday. Not currently on anticoagulation  Hypertension -well controlled today  Cardiac risk counseling and prevention recommendations: -recommend heart healthy/Mediterranean diet, with whole grains, fruits, vegetable, fish, lean meats, nuts, and olive oil. Limit salt. -recommend  moderate walking, 3-5 times/week for 30-50 minutes each session. Aim for at least 150 minutes.week. Goal should be pace of 3 miles/hours, or walking 1.5 miles  in 30 minutes -recommend avoidance of tobacco products. Avoid excess alcohol. -ASCVD risk score: The 10-year ASCVD risk score (Arnett DK, et al., 2019) is: 18.7%   Values used to calculate the score:     Age: 85 years     Sex: Male     Is Non-Hispanic African American: No     Diabetic: No     Tobacco smoker: No     Systolic Blood Pressure: 025 mmHg     Is BP treated: Yes     HDL Cholesterol: 35.2 mg/dL     Total Cholesterol: 225 mg/dL    Plan for follow up: 9 months or sooner as needed.Ideally will see before his next window for surgery to assess for any new symptoms or concerns  Buford Dresser, MD, PhD, [facility]    Medication Adjustments/Labs and Tests Ordered: Current medicines are reviewed at length with the patient today.  Concerns regarding medicines are outlined above.   Orders Placed This Encounter  Procedures   CT CORONARY MORPH W/CTA COR W/SCORE W/CA W/CM &/OR WO/CM   Basic metabolic panel   EKG 42-[facility]   Meds ordered this encounter  Medications   ivabradine (CORLANOR) 5 MG TABS tablet    Sig: Take 3 tablets (15 mg total) by mouth once for 1 dose. Take 90 - 120 minutes prior to scan.    Dispense:  3 tablet    Refill:  0   Patient Instructions  Medication Instructions:  IVABRADINE 5 MG Take 3 tablets (15 mg total) by mouth once for 1 dose. Take 90-120 minutes prior to scan.   *If you need a refill on your cardiac medications before your next appointment, please call your pharmacy*  Lab Work: BMET ABOUT 1 [place]  If you have labs (blood work) drawn today and your tests are completely normal, you will receive your results only by: [place] (if you have MyChart) OR A paper copy in the mail If you have any lab test that is abnormal or we need to change your  treatment, we will call you to review the results.  Testing/Procedures: Your physician has requested that you have cardiac CT. Cardiac computed tomography (CT) is a painless test that uses an x-ray machine to take clear, detailed pictures of your heart. For further information please visit [email or web address].[email or web address]. Please follow instruction sheet as given.  Follow-Up: At [facility], you and your health needs are our priority.  As part of our continuing mission to provide you with exceptional heart care, we have created designated Provider Care Teams.  These Care Teams include your primary Cardiologist (physician) and Advanced Practice Providers (APPs -  Physician Assistants and Nurse Practitioners) who all work together to provide you with the care you need, when you need it.  We recommend signing up for the patient portal called "MyChart".  Sign up information is provided on this After Visit Summary.  MyChart is used to connect with patients for Virtual Visits (Telemedicine).  Patients are able to view lab/test results, encounter notes, upcoming appointments, etc.  Non-urgent messages can be sent to your provider as well.   To learn more about what you can do with MyChart, go to [email or web address].    Your next appointment:   9 month(s)  Provider:   Buford Dresser, MD    Other Instructions    Your cardiac CT will be scheduled at one of the below locations:   [facility] [address]  [address] [place], [place] [address] [phone]  OR  [facility] [address] [place], [place] [address] [phone]  OR   [facility] [address] [place], [place] [address] [phone]  If scheduled at [facility], please arrive at the [facility] (Entrance C2) of [facility] 30 minutes prior to test start time. You can use the FREE valet parking offered at  entrance C (encouraged to control the heart rate for the test)  Proceed to the [facility] Department (first floor) to check-in and test prep.  All radiology patients and guests should use entrance C2 at [facility], accessed from [facility], even though the hospital's physical address listed is [address].    If scheduled at [facility] or [facility], please arrive 15 mins early for check-in and test prep.   Please follow these instructions carefully (unless otherwise directed):  Hold all erectile dysfunction medications at least 3 days (72 hrs) prior to test. (Ie viagra, cialis, sildenafil, tadalafil, etc) We will administer nitroglycerin during this exam.   On the Night Before the Test: Be sure to Drink plenty of water. Do not consume any caffeinated/decaffeinated beverages or chocolate 12 hours prior to your test. Do not take any antihistamines 12 hours prior to your test.  On the Day of the Test: Drink plenty of water until 1 hour prior to the test. Do not eat any food 1 hour prior to test. You may take your regular medications prior to the test.  Take metoprolol (Lopressor) two hours prior to test. HOLD Furosemide/Hydrochlorothiazide morning of the test. FEMALES- please wear underwire-free bra if available, avoid dresses & tight clothing  After the Test: Drink plenty of water. After receiving IV contrast, you may experience a mild flushed feeling. This is normal. On occasion, you may experience a mild rash up to 24 hours after the test. This is not dangerous. If this occurs, you can take Benadryl 25 mg and increase your fluid intake. If you experience trouble breathing, this can be serious. If it is severe call 911 IMMEDIATELY. If it is mild, please call our office. If you take any of these medications: Glipizide/Metformin, Avandament, Glucavance, please do not take 48 hours after  completing test unless otherwise instructed.  We will call to schedule your test 2-4 weeks out understanding that some insurance companies will need an authorization prior to the service being performed.   For non-scheduling related questions, please contact the cardiac imaging nurse navigator should you have any questions/concerns: [place], Cardiac Imaging Nurse Navigator Larey Brick, Cardiac Imaging Nurse Navigator [facility] Direct Office Dial: [phone]   For scheduling needs, including cancellations and rescheduling, please call [place], [phone].    I,Mathew Stumpf,acting as a Neurosurgeon for [employer], MD.,have documented all relevant documentation on the behalf of Jodelle Red, MD,as directed by  Jodelle Red, MD while in the presence of Jodelle Red, MD.  I, Jodelle Red, MD, have reviewed all documentation for this visit. The documentation on 04/26/22 for the exam, diagnosis, procedures, and orders are all accurate and complete.   Signed, Jodelle Red, MD PhD 04/26/2022 11:36 AM    [facility]  Addended to add: Plaque volume data reviewed as part of DECIDE registry. Total plaque volume 84 mm3  Did you add a medication? No  If yes, how many? NA  If no, reason? Reason for not adding med: Other already on appropriate medical therapy  Did you remove a medication? No  Did you increase the dosage of any medication? No  Did you decrease the dosage of any medication? No  Did you refer to a specialist (i.e. lipid clinic, preventive cardiology, endocrinology)? No  Has patient seen plaque report? No

## 2022-04-25 NOTE — Patient Instructions (Signed)
Medication Instructions:  IVABRADINE 5 MG Take 3 tablets (15 mg total) by mouth once for 1 dose. Take 90-120 minutes prior to scan.   *If you need a refill on your cardiac medications before your next appointment, please call your pharmacy*  Lab Work: BMET ABOUT 1 [place]  If you have labs (blood work) drawn today and your tests are completely normal, you will receive your results only by: [place] (if you have MyChart) OR A paper copy in the mail If you have any lab test that is abnormal or we need to change your treatment, we will call you to review the results.  Testing/Procedures: Your physician has requested that you have cardiac CT. Cardiac computed tomography (CT) is a painless test that uses an x-ray machine to take clear, detailed pictures of your heart. For further information please visit [email or web address]. Please follow instruction sheet as given.  Follow-Up: At [facility], you and your health needs are our priority.  As part of our continuing mission to provide you with exceptional heart care, we have created designated Provider Care Teams.  These Care Teams include your primary Cardiologist (physician) and Advanced Practice Providers (APPs -  Physician Assistants and Nurse Practitioners) who all work together to provide you with the care you need, when you need it.  We recommend signing up for the patient portal called "MyChart".  Sign up information is provided on this After Visit Summary.  MyChart is used to connect with patients for Virtual Visits (Telemedicine).  Patients are able to view lab/test results, encounter notes, upcoming appointments, etc.  Non-urgent messages can be sent to your provider as well.   To learn more about what you can do with MyChart, go to [email or web address].    Your next appointment:   9 month(s)  Provider:   Buford Dresser, MD    Other Instructions    Your cardiac CT will be scheduled at one of the  below locations:   [facility] [address] [place], [place] [address] [phone]  [facility] [address] [place], [place] [address] [phone]  [facility] [place], [place] [address] [phone]  If scheduled at [facility], please arrive at the [facility] and Children's Entrance (Entrance C2) of [facility] 30 minutes prior to test start time. You can use the FREE valet parking offered at entrance C (encouraged to control the heart rate for the test)  Proceed to the [facility] Radiology Department (first floor) to check-in and test prep.  All radiology patients and guests should use entrance C2 at [facility], accessed from [facility], even though the hospital's physical address listed is [address]    If scheduled at [facility] or [facility], please arrive 15 mins early for check-in and test prep.   Please follow these instructions carefully (unless otherwise directed):  Hold all erectile dysfunction medications at least 3 days (72 hrs) prior to test. (Ie viagra, cialis, sildenafil, tadalafil, etc) We will administer nitroglycerin during this exam.   On the Night Before the Test: Be sure to Drink plenty of water. Do not consume any caffeinated/decaffeinated beverages or chocolate 12 hours prior to your test. Do not take any antihistamines 12 hours prior to your test.  On the Day of the Test: Drink plenty of water until 1 hour prior  to the test. Do not eat any food 1 hour prior to test. You may take your regular medications prior to the test.  Take metoprolol (Lopressor) two hours prior to test. HOLD Furosemide/Hydrochlorothiazide morning of the test. FEMALES- please wear underwire-free bra if available, avoid dresses & tight clothing  After the  Test: Drink plenty of water. After receiving IV contrast, you may experience a mild flushed feeling. This is normal. On occasion, you may experience a mild rash up to 24 hours after the test. This is not dangerous. If this occurs, you can take Benadryl 25 mg and increase your fluid intake. If you experience trouble breathing, this can be serious. If it is severe call 911 IMMEDIATELY. If it is mild, please call our office. If you take any of these medications: Glipizide/Metformin, Avandament, Glucavance, please do not take 48 hours after completing test unless otherwise instructed.  We will call to schedule your test 2-4 weeks out understanding that some insurance companies will need an authorization prior to the service being performed.   For non-scheduling related questions, please contact the cardiac imaging nurse navigator should you have any questions/concerns: Marchia Bond, Cardiac Imaging Nurse Navigator Gordy Clement, Cardiac Imaging Nurse Navigator [facility] Direct Office Dial: [phone]   For scheduling needs, including cancellations and rescheduling, please call [place], [phone].

## 2022-04-27 ENCOUNTER — Encounter (HOSPITAL_BASED_OUTPATIENT_CLINIC_OR_DEPARTMENT_OTHER): Payer: Self-pay

## 2022-04-27 ENCOUNTER — Other Ambulatory Visit (HOSPITAL_COMMUNITY): Payer: Self-pay

## 2022-04-27 LAB — BASIC METABOLIC PANEL
BUN/Creatinine Ratio: 15 (ref 10–24)
BUN: 17 mg/dL (ref 8–27)
CO2: 22 mmol/L (ref 20–29)
Calcium: 9.6 mg/dL (ref 8.6–10.2)
Chloride: 103 mmol/L (ref 96–106)
Creatinine, Ser: 1.13 mg/dL (ref 0.76–1.27)
Glucose: 137 mg/dL — ABNORMAL HIGH (ref 70–99)
Potassium: 4.5 mmol/L (ref 3.5–5.2)
Sodium: 141 mmol/L (ref 134–144)
eGFR: 73 mL/min/{1.73_m2} (ref >59)

## 2022-04-27 NOTE — Telephone Encounter (Signed)
Records as requested

## 2022-04-28 ENCOUNTER — Encounter: Payer: Self-pay | Admitting: Internal Medicine

## 2022-04-28 ENCOUNTER — Telehealth (HOSPITAL_COMMUNITY): Payer: Self-pay | Admitting: *Deleted

## 2022-04-28 ENCOUNTER — Other Ambulatory Visit: Payer: Self-pay | Admitting: Internal Medicine

## 2022-04-28 DIAGNOSIS — M5010 Cervical disc disorder with radiculopathy, unspecified cervical region: Secondary | ICD-10-CM

## 2022-04-28 DIAGNOSIS — M159 Polyosteoarthritis, unspecified: Secondary | ICD-10-CM

## 2022-04-28 DIAGNOSIS — G8929 Other chronic pain: Secondary | ICD-10-CM

## 2022-04-28 NOTE — Telephone Encounter (Signed)
Attempted to call patient regarding upcoming cardiac CT appointment. °Left message on voicemail with name and callback number ° °Reene Harlacher RN Navigator Cardiac Imaging °[facility] °[phone] Office °[phone] Cell ° °

## 2022-05-01 ENCOUNTER — Ambulatory Visit (HOSPITAL_COMMUNITY)
Admission: RE | Admit: 2022-05-01 | Discharge: 2022-05-01 | Disposition: A | Discharge status: Home / Self Care | Payer: Commercial Managed Care - HMO | Source: Ambulatory Visit | Attending: Cardiology | Admitting: Cardiology

## 2022-05-01 DIAGNOSIS — I10 Essential (primary) hypertension: Secondary | ICD-10-CM

## 2022-05-01 DIAGNOSIS — R079 Chest pain, unspecified: Secondary | ICD-10-CM

## 2022-05-01 DIAGNOSIS — Z01810 Encounter for preprocedural cardiovascular examination: Secondary | ICD-10-CM

## 2022-05-01 MED ORDER — METOPROLOL TARTRATE 5 MG/5ML IV SOLN
INTRAVENOUS | Status: AC
  Administered 2022-05-01: 5 mg via INTRAVENOUS
  Filled 2022-05-01: qty 10

## 2022-05-01 MED ORDER — MELOXICAM 15 MG PO TABS: ORAL_TABLET | ORAL | 0 refills | Status: DC

## 2022-05-01 MED ORDER — NITROGLYCERIN 0.4 MG SL SUBL
0.8000 mg | SUBLINGUAL_TABLET | SUBLINGUAL | Status: DC | PRN
  Administered 2022-05-01: 0.8 mg via SUBLINGUAL

## 2022-05-01 MED ORDER — METOPROLOL TARTRATE 5 MG/5ML IV SOLN
5.0000 mg | INTRAVENOUS | Status: DC | PRN
  Administered 2022-05-01: 5 mg via INTRAVENOUS

## 2022-05-01 MED ORDER — DILTIAZEM HCL 25 MG/5ML IV SOLN
INTRAVENOUS | Status: AC
  Filled 2022-05-01: qty 5

## 2022-05-01 MED ORDER — DILTIAZEM HCL 25 MG/5ML IV SOLN: 5.0000 mg | Freq: Once | INTRAVENOUS | Status: DC

## 2022-05-01 MED ORDER — IOHEXOL 350 MG/ML SOLN
95.0000 mL | Freq: Once | INTRAVENOUS | Status: AC | PRN
  Administered 2022-05-01: 95 mL via INTRAVENOUS

## 2022-05-01 MED ORDER — NITROGLYCERIN 0.4 MG SL SUBL
SUBLINGUAL_TABLET | SUBLINGUAL | Status: AC
  Filled 2022-05-01: qty 2

## 2022-05-02 ENCOUNTER — Other Ambulatory Visit: Payer: Self-pay | Admitting: Internal Medicine

## 2022-05-02 DIAGNOSIS — E785 Hyperlipidemia, unspecified: Secondary | ICD-10-CM

## 2022-05-03 NOTE — Progress Notes (Signed)
Discussed results with patient who verbalized understanding.  Pt is having a physicial with his PCP 05/22/2022.  Pt wants to  have FLP/LFT done at that time along with additional labs his PCP is ordering. Pt. Understands instructions to be fasting. Georgana Curio MHA RN CCM

## 2022-05-16 ENCOUNTER — Encounter: Payer: Self-pay | Admitting: Family Medicine

## 2022-05-16 ENCOUNTER — Other Ambulatory Visit: Payer: Self-pay

## 2022-05-16 DIAGNOSIS — M5412 Radiculopathy, cervical region: Secondary | ICD-10-CM

## 2022-05-18 ENCOUNTER — Other Ambulatory Visit: Payer: Self-pay | Admitting: Internal Medicine

## 2022-05-18 ENCOUNTER — Encounter (HOSPITAL_COMMUNITY): Payer: Self-pay | Admitting: *Deleted

## 2022-05-18 DIAGNOSIS — I10 Essential (primary) hypertension: Secondary | ICD-10-CM

## 2022-05-22 ENCOUNTER — Ambulatory Visit (INDEPENDENT_AMBULATORY_CARE_PROVIDER_SITE_OTHER): Payer: Medicare Other | Admitting: Internal Medicine

## 2022-05-22 ENCOUNTER — Encounter: Payer: Self-pay | Admitting: Internal Medicine

## 2022-05-22 VITALS — BP 136/86 | HR 94 | Temp 98.2°F | Resp 16 | Ht 71.0 in | Wt 208.0 lb

## 2022-05-22 DIAGNOSIS — E785 Hyperlipidemia, unspecified: Secondary | ICD-10-CM

## 2022-05-22 DIAGNOSIS — I10 Essential (primary) hypertension: Secondary | ICD-10-CM | POA: Diagnosis not present

## 2022-05-22 DIAGNOSIS — N400 Enlarged prostate without lower urinary tract symptoms: Secondary | ICD-10-CM

## 2022-05-22 DIAGNOSIS — R739 Hyperglycemia, unspecified: Secondary | ICD-10-CM

## 2022-05-22 DIAGNOSIS — E291 Testicular hypofunction: Secondary | ICD-10-CM

## 2022-05-22 DIAGNOSIS — I480 Paroxysmal atrial fibrillation: Secondary | ICD-10-CM

## 2022-05-22 DIAGNOSIS — Z0001 Encounter for general adult medical examination with abnormal findings: Secondary | ICD-10-CM | POA: Diagnosis not present

## 2022-05-22 DIAGNOSIS — N5201 Erectile dysfunction due to arterial insufficiency: Secondary | ICD-10-CM

## 2022-05-22 DIAGNOSIS — Z1211 Encounter for screening for malignant neoplasm of colon: Secondary | ICD-10-CM

## 2022-05-22 DIAGNOSIS — Z23 Encounter for immunization: Secondary | ICD-10-CM

## 2022-05-22 LAB — URINALYSIS, ROUTINE W REFLEX MICROSCOPIC
Bilirubin Urine: NEGATIVE
Hgb urine dipstick: NEGATIVE
Ketones, ur: NEGATIVE
Leukocytes,Ua: NEGATIVE
Nitrite: NEGATIVE
RBC / HPF: NONE SEEN (ref 0–?)
Specific Gravity, Urine: 1.02 (ref 1.000–1.030)
Urine Glucose: NEGATIVE
Urobilinogen, UA: 0.2 (ref 0.0–1.0)
WBC, UA: NONE SEEN (ref 0–?)
pH: 7.5 (ref 5.0–8.0)

## 2022-05-22 LAB — BASIC METABOLIC PANEL
BUN: 16 mg/dL (ref 6–23)
CO2: 28 mEq/L (ref 19–32)
Calcium: 9.6 mg/dL (ref 8.4–10.5)
Chloride: 103 mEq/L (ref 96–112)
Creatinine, Ser: 1.08 mg/dL (ref 0.40–1.50)
GFR: 72.27 mL/min (ref >60.00)
Glucose, Bld: 104 mg/dL — ABNORMAL HIGH (ref 70–99)
Potassium: 4.5 mEq/L (ref 3.5–5.1)
Sodium: 139 mEq/L (ref 135–145)

## 2022-05-22 LAB — CBC WITH DIFFERENTIAL/PLATELET
Basophils Absolute: 0 10*3/uL (ref 0.0–0.1)
Basophils Relative: 0.7 % (ref 0.0–3.0)
Eosinophils Absolute: 0.2 10*3/uL (ref 0.0–0.7)
Eosinophils Relative: 2.6 % (ref 0.0–5.0)
HCT: 48.5 % (ref 39.0–52.0)
Hemoglobin: 16.1 g/dL (ref 13.0–17.0)
Lymphocytes Relative: 17 % (ref 12.0–46.0)
Lymphs Abs: 1.1 10*3/uL (ref 0.7–4.0)
MCHC: 33.1 g/dL (ref 30.0–36.0)
MCV: 92.3 fl (ref 78.0–100.0)
Monocytes Absolute: 0.7 10*3/uL (ref 0.1–1.0)
Monocytes Relative: 10.3 % (ref 3.0–12.0)
Neutro Abs: 4.7 10*3/uL (ref 1.4–7.7)
Neutrophils Relative %: 69.4 % (ref 43.0–77.0)
Platelets: 165 10*3/uL (ref 150.0–400.0)
RBC: 5.25 Mil/uL (ref 4.22–5.81)
RDW: 14.4 % (ref 11.5–15.5)
WBC: 6.7 10*3/uL (ref 4.0–10.5)

## 2022-05-22 LAB — HEPATIC FUNCTION PANEL
ALT: 36 U/L (ref 0–53)
AST: 28 U/L (ref 0–37)
Albumin: 4.4 g/dL (ref 3.5–5.2)
Alkaline Phosphatase: 64 U/L (ref 39–117)
Bilirubin, Direct: 0.2 mg/dL (ref 0.0–0.3)
Total Bilirubin: 0.9 mg/dL (ref 0.2–1.2)
Total Protein: 6.6 g/dL (ref 6.0–8.3)

## 2022-05-22 LAB — LIPID PANEL
Cholesterol: 121 mg/dL (ref 0–200)
HDL: 37 mg/dL — ABNORMAL LOW (ref >39.00)
LDL Cholesterol: 65 mg/dL (ref 0–99)
NonHDL: 84.25
Total CHOL/HDL Ratio: 3
Triglycerides: 96 mg/dL (ref 0.0–149.0)
VLDL: 19.2 mg/dL (ref 0.0–40.0)

## 2022-05-22 LAB — HEMOGLOBIN A1C: Hgb A1c MFr Bld: 5.8 % (ref 4.6–6.5)

## 2022-05-22 LAB — TSH: TSH: 1.19 u[IU]/mL (ref 0.35–5.50)

## 2022-05-22 LAB — PSA: PSA: 1.14 ng/mL (ref 0.10–4.00)

## 2022-05-22 MED ORDER — SILDENAFIL CITRATE 100 MG PO TABS: 100.0000 mg | ORAL_TABLET | Freq: Every day | ORAL | 4 refills | Status: DC | PRN

## 2022-05-22 MED ORDER — SHINGRIX 50 MCG/0.5ML IM SUSR: 0.5000 mL | Freq: Once | INTRAMUSCULAR | 1 refills | Status: AC

## 2022-05-22 NOTE — Patient Instructions (Signed)
Health Maintenance, Male Adopting a healthy lifestyle and getting preventive care are important in promoting health and wellness. Ask your health care provider about: The right schedule for you to have regular tests and exams. Things you can do on your own to prevent diseases and keep yourself healthy. What should I know about diet, weight, and exercise? Eat a healthy diet  Eat a diet that includes plenty of vegetables, fruits, low-fat dairy products, and lean protein. Do not eat a lot of foods that are high in solid fats, added sugars, or sodium. Maintain a healthy weight Body mass index (BMI) is a measurement that can be used to identify possible weight problems. It estimates body fat based on height and weight. Your health care provider can help determine your BMI and help you achieve or maintain a healthy weight. Get regular exercise Get regular exercise. This is one of the most important things you can do for your health. Most adults should: Exercise for at least 150 minutes each week. The exercise should increase your heart rate and make you sweat (moderate-intensity exercise). Do strengthening exercises at least twice a week. This is in addition to the moderate-intensity exercise. Spend less time sitting. Even light physical activity can be beneficial. Watch cholesterol and blood lipids Have your blood tested for lipids and cholesterol at 65 years of age, then have this test every 5 years. You may need to have your cholesterol levels checked more often if: Your lipid or cholesterol levels are high. You are older than 65 years of age. You are at high risk for heart disease. What should I know about cancer screening? Many types of cancers can be detected early and may often be prevented. Depending on your health history and family history, you may need to have cancer screening at various ages. This may include screening for: Colorectal cancer. Prostate cancer. Skin cancer. Lung  cancer. What should I know about heart disease, diabetes, and high blood pressure? Blood pressure and heart disease High blood pressure causes heart disease and increases the risk of stroke. This is more likely to develop in people who have high blood pressure readings or are overweight. Talk with your health care provider about your target blood pressure readings. Have your blood pressure checked: Every 3-5 years if you are 18-39 years of age. Every year if you are 40 years old or older. If you are between the ages of 65 and 75 and are a current or former smoker, ask your health care provider if you should have a one-time screening for abdominal aortic aneurysm (AAA). Diabetes Have regular diabetes screenings. This checks your fasting blood sugar level. Have the screening done: Once every three years after age 45 if you are at a normal weight and have a low risk for diabetes. More often and at a younger age if you are overweight or have a high risk for diabetes. What should I know about preventing infection? Hepatitis B If you have a higher risk for hepatitis B, you should be screened for this virus. Talk with your health care provider to find out if you are at risk for hepatitis B infection. Hepatitis C Blood testing is recommended for: Everyone born from 1945 through 1965. Anyone with known risk factors for hepatitis C. Sexually transmitted infections (STIs) You should be screened each year for STIs, including gonorrhea and chlamydia, if: You are sexually active and are younger than 65 years of age. You are older than 65 years of age and your   health care provider tells you that you are at risk for this type of infection. Your sexual activity has changed since you were last screened, and you are at increased risk for chlamydia or gonorrhea. Ask your health care provider if you are at risk. Ask your health care provider about whether you are at high risk for HIV. Your health care provider  may recommend a prescription medicine to help prevent HIV infection. If you choose to take medicine to prevent HIV, you should first get tested for HIV. You should then be tested every 3 months for as long as you are taking the medicine. Follow these instructions at home: Alcohol use Do not drink alcohol if your health care provider tells you not to drink. If you drink alcohol: Limit how much you have to 0-2 drinks a day. Know how much alcohol is in your drink. In the [place] one drink equals one 12 oz bottle of beer (355 mL), one 5 oz glass of wine (148 mL), or one 1 oz glass of hard liquor (44 mL). Lifestyle Do not use any products that contain nicotine or tobacco. These products include cigarettes, chewing tobacco, and vaping devices, such as e-cigarettes. If you need help quitting, ask your health care provider. Do not use street drugs. Do not share needles. Ask your health care provider for help if you need support or information about quitting drugs. General instructions Schedule regular health, dental, and eye exams. Stay current with your vaccines. Tell your health care provider if: You often feel depressed. You have ever been abused or do not feel safe at home. Summary Adopting a healthy lifestyle and getting preventive care are important in promoting health and wellness. Follow your health care provider's instructions about healthy diet, exercising, and getting tested or screened for diseases. Follow your health care provider's instructions on monitoring your cholesterol and blood pressure. This information is not intended to replace advice given to you by your health care provider. Make sure you discuss any questions you have with your health care provider. Document Revised: 08/16/2020 Document Reviewed: 08/16/2020 Elsevier Patient Education  2023 Elsevier Inc.  

## 2022-05-22 NOTE — Progress Notes (Unsigned)
Subjective:  Patient ID: John [name], male    DOB: 11-27-1955  Age: 67 y.o. MRN: [id number]  CC: Annual Exam, Hypertension, Atrial Fibrillation, and Hyperlipidemia   HPI John [name] presents for a CPX and f/up ----  He continue to c/o symptoms of low T.   Outpatient Medications Prior to Visit  Medication Sig Dispense Refill   BREO ELLIPTA 100-25 MCG/ACT AEPB Inhale 1 puff into the lungs daily.     carvedilol (COREG) 3.125 MG tablet TAKE 1 TABLET(3.125 MG) BY MOUTH TWICE DAILY WITH A MEAL 180 tablet 0   CHELATED MAGNESIUM PO Take 200 mg by mouth in the morning.     cholecalciferol (VITAMIN D3) 25 MCG (1000 UT) tablet Take 1,000 Units by mouth every evening.     Coenzyme Q10 (CO Q 10 PO) Take 400 mg by mouth in the morning.     diltiazem (CARDIZEM CD) 120 MG 24 hr capsule TAKE 1 CAPSULE(120 MG) BY MOUTH DAILY (Patient taking differently: Take 120 mg by mouth every evening.) 90 capsule 1   diltiazem (CARDIZEM) 30 MG tablet TAKE 1 TABLET BY MOUTH EVERY 4 HOURS AS NEEDED HR ABOVE 100 AND TOP NUMBER B/P GREATER THAN 100 30 tablet 1   esomeprazole (NEXIUM) 20 MG capsule Take 1 capsule (20 mg total) by mouth daily at 12 noon. (Patient taking differently: Take 20 mg by mouth every evening.) 90 capsule 1   Flaxseed, Linseed, (FLAX SEED OIL PO) Take 1 tablet by mouth every morning.     fluticasone (FLONASE) 50 MCG/ACT nasal spray Place 2 sprays into both nostrils in the morning and at bedtime.     ipratropium (ATROVENT) 0.06 % nasal spray Place 1 spray into both nostrils 2 (two) times daily. (Patient taking differently: Place 1 spray into both nostrils in the morning.) 90 mL 3   montelukast (SINGULAIR) 10 MG tablet TAKE 1 TABLET(10 MG) BY MOUTH DAILY 90 tablet 1   Multiple Vitamins-Minerals (MULTIVITAMIN WITH MINERALS) tablet Take 1 tablet by mouth in the morning.     olmesartan (BENICAR) 40 MG tablet TAKE 1 TABLET(40 MG) BY MOUTH DAILY 90 tablet 0   Omega-3 Fatty Acids (FISH OIL) 1200 MG CAPS  Take 1 capsule by mouth in the morning and at bedtime.     rosuvastatin (CRESTOR) 20 MG tablet TAKE 1 TABLET(20 MG) BY MOUTH DAILY 90 tablet 1   traMADol (ULTRAM) 50 MG tablet Take 1 tablet (50 mg total) by mouth 2 (two) times daily. (Patient taking differently: Take 50 mg by mouth in the morning.) 60 tablet 3   TURMERIC PO Take 800 mg by mouth 2 (two) times daily.     vitamin B-12 (CYANOCOBALAMIN) 500 MCG tablet Take 500 mcg by mouth in the morning.     meloxicam (MOBIC) 15 MG tablet TAKE 1 TABLET(15 MG) BY MOUTH DAILY 90 tablet 0   methylPREDNISolone (MEDROL DOSEPAK) 4 MG TBPK tablet 4 pills day 1, 3 pills day 2-3, 2 pills day 4-7, 1 pill day 8-10, 1/2 pill thereafter until gone 21 tablet 0   Prasterone, DHEA, (DHEA PO) Take 150 mg by mouth every evening.     sildenafil (VIAGRA) 25 MG tablet Take 25 mg by mouth daily as needed for erectile dysfunction.     testosterone (ANDROGEL) 50 MG/5GM (1%) GEL Place 5 g onto the skin daily. (Patient taking differently: Place 5 g onto the skin in the morning.) 450 g 0   vitamin E 180 MG (400 UNITS) capsule Take  400 Units by mouth every evening.     No facility-administered medications prior to visit.    ROS Review of Systems  Constitutional:  Positive for fatigue. Negative for appetite change, diaphoresis and unexpected weight change.  HENT: Negative.    Eyes: Negative.   Respiratory:  Negative for cough, chest tightness and wheezing.   Cardiovascular:  Negative for chest pain, palpitations and leg swelling.  Gastrointestinal:  Negative for abdominal pain, constipation, diarrhea, nausea and vomiting.  Endocrine: Negative.   Genitourinary: Negative.  Negative for difficulty urinating and dysuria.       Low libido and ED  Musculoskeletal:  Positive for arthralgias. Negative for myalgias and neck pain.  Skin: Negative.   Neurological: Negative.  Negative for dizziness and weakness.  Hematological:  Negative for adenopathy. Does not bruise/bleed  easily.  Psychiatric/Behavioral: Negative.      Objective:  BP 136/86 (BP Location: Left Arm, Patient Position: Sitting, Cuff Size: Large)   Pulse 94   Temp 98.2 F (36.8 C) (Oral)   Resp 16   Ht 5' 11"$  (1.803 m)   Wt 208 lb (94.3 kg)   SpO2 95%   BMI 29.01 kg/m   BP Readings from Last 3 Encounters:  05/22/22 136/86  05/01/22 132/82  04/25/22 130/78    Wt Readings from Last 3 Encounters:  05/22/22 208 lb (94.3 kg)  04/25/22 202 lb 14.4 oz (92 kg)  04/11/22 211 lb (95.7 kg)    Physical Exam Vitals reviewed.  HENT:     Nose: Nose normal.     Mouth/Throat:     Mouth: Mucous membranes are moist.  Eyes:     General: No scleral icterus.    Conjunctiva/sclera: Conjunctivae normal.  Cardiovascular:     Rate and Rhythm: Normal rate and regular rhythm.     Heart sounds: No murmur heard. Pulmonary:     Effort: Pulmonary effort is normal.     Breath sounds: No stridor. No wheezing, rhonchi or rales.  Abdominal:     General: Abdomen is flat.     Palpations: There is no mass.     Tenderness: There is no abdominal tenderness. There is no guarding.     Hernia: No hernia is present. There is no hernia in the left inguinal area or right inguinal area.  Genitourinary:    Pubic Area: No rash.      Penis: Normal and circumcised.      Testes: Normal.     Epididymis:     Right: Normal.     Left: Normal.     Prostate: Enlarged. Not tender and no nodules present.     Rectum: Normal. Guaiac result negative. No mass, tenderness, anal fissure, external hemorrhoid or internal hemorrhoid. Normal anal tone.  Musculoskeletal:        General: Normal range of motion.     Cervical back: Neck supple.     Right lower leg: No edema.     Left lower leg: No edema.  Lymphadenopathy:     Cervical: No cervical adenopathy.     Lower Body: No right inguinal adenopathy. No left inguinal adenopathy.  Skin:    General: Skin is warm and dry.  Neurological:     General: No focal deficit present.      Mental Status: He is alert.  Psychiatric:        Mood and Affect: Mood normal.        Behavior: Behavior normal.    Lab Results  Component Value Date  WBC 6.7 05/22/2022   HGB 16.1 05/22/2022   HCT 48.5 05/22/2022   PLT 165.0 05/22/2022   GLUCOSE 104 (H) 05/22/2022   CHOL 121 05/22/2022   TRIG 96.0 05/22/2022   HDL 37.00 (L) 05/22/2022   LDLCALC 65 05/22/2022   ALT 36 05/22/2022   AST 28 05/22/2022   NA 139 05/22/2022   K 4.5 05/22/2022   CL 103 05/22/2022   CREATININE 1.08 05/22/2022   BUN 16 05/22/2022   CO2 28 05/22/2022   TSH 1.19 05/22/2022   PSA 1.14 05/22/2022   HGBA1C 5.8 05/22/2022    CT CORONARY MORPH W/CTA COR W/SCORE W/CA W/CM &/OR WO/CM  Addendum Date: 05/03/2022   ADDENDUM REPORT: 05/03/2022 07:40 EXAM: OVER-READ INTERPRETATION  CT CHEST The following report is an over-read performed by radiologist Dr. Rebekah Chesterfield [facility] on 05/03/2022. This over-read does not include interpretation of cardiac or coronary anatomy or pathology. The coronary calcium score and cardiac CTA interpretation by the cardiologist is attached. COMPARISON:  Coronary calcium score 02/13/2022. FINDINGS: Atherosclerotic calcifications in the thoracic aorta. Within the visualized portions of the thorax there are no suspicious appearing pulmonary nodules or masses, there is no acute consolidative airspace disease, no pleural effusions, no pneumothorax and no lymphadenopathy. Visualized portions of the upper abdomen are unremarkable. There are no aggressive appearing lytic or blastic lesions noted in the visualized portions of the skeleton. IMPRESSION: 1.  Aortic Atherosclerosis (ICD10-I70.0). Electronically Signed   By: Vinnie Langton M.D.   On: 05/03/2022 07:40   Result Date: 05/03/2022 CLINICAL DATA:  Chest pain EXAM: Cardiac CTA MEDICATIONS: Sub lingual nitro. 54m x 2 TECHNIQUE: The patient was scanned on a Siemens [id number] scanner. Gantry rotation speed was 250 msecs.  Collimation was 0.6 mm. A 100 kV prospective scan was triggered in the ascending thoracic aorta at 35-75% of the R-R interval. Average HR during the scan was 60 bpm. The 3D data set was interpreted on a dedicated work station using MPR, MIP and VRT modes. A total of 80cc of contrast was used. FINDINGS: [facility]cardiac: [facility] No LA appendage thrombus. Pulmonary veins drain normally to the left atrium. Ascending aorta mildly enlarged 3.7 cm. Calcium Score: 45.5 Agatston units. Coronary Arteries: Right dominant with no anomalies LM: No plaque or stenosis. LAD system: Mixed plaque proximal LAD, mild (1-24%) stenosis. Large D1 without significant disease. Circumflex system: No plaque or stenosis. RCA system: No plaque or stenosis. IMPRESSION: 1. Coronary artery calcium score 45.5 Agatston units. This places the patient in the 45th percentile for age and gender, suggesting intermediate risk for future cardiac events. 2.  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 Dalton MTeaching laboratory technicianElectronically Signed: By: DLoralie ChampagneM.D. On: 05/01/2022 18:33    Assessment & Plan:   SNaviwas seen today for annual exam, hypertension, atrial fibrillation and hyperlipidemia.  Diagnoses and all orders for this visit:  Primary hypertension- His blood pressure is adequately well-controlled. -     Basic metabolic panel; Future -     CBC with Differential/Platelet; Future -     TSH; Future -     Hepatic function panel; Future -     Urinalysis, Routine w reflex microscopic; Future -     Urinalysis, Routine w reflex microscopic -     Hepatic function panel -     TSH -     CBC with Differential/Platelet -     Basic metabolic panel  Hyperlipidemia LDL goal <130- LDL goal achieved.  Doing well on the statin  -     Lipid panel; Future -     Hepatic function panel; Future -     Hepatic function panel -     Lipid panel  Chronic hyperglycemia- His A1C is normal. -     Basic metabolic panel; Future -      Hemoglobin A1c; Future -     Hemoglobin A1c -     Basic metabolic panel  PAF (paroxysmal atrial fibrillation) ([place])- He has good rate and rhythm control. -     TSH; Future -     TSH  Encounter for general adult medical examination with abnormal findings- Exam completed, labs reviewed, vaccines reviewed and updated, cancer screenings addressed, patient education was given.  Hypogonadism, male- Will increase the T dosage. -     Testosterone Total,Free,Bio, Males; Future -     Testosterone Total,Free,Bio, Males -     testosterone (ANDROGEL) 50 MG/5GM (1%) GEL; Place 10 g onto the skin daily.  Need for prophylactic vaccination and inoculation against varicella -     Zoster Vaccine Adjuvanted [facility]) injection; Inject 0.5 mLs into the muscle once for 1 dose.  Screen for colon cancer -     Cologuard  Need for vaccination -     Pneumococcal conjugate vaccine 20-valent  Benign prostatic hyperplasia without lower urinary tract symptoms- His PSA is normal. -     PSA; Future -     Urinalysis, Routine w reflex microscopic; Future -     Urinalysis, Routine w reflex microscopic -     PSA  Erectile dysfunction due to arterial insufficiency -     sildenafil (VIAGRA) 100 MG tablet; Take 1 tablet (100 mg total) by mouth daily as needed for erectile dysfunction.   I have discontinued Chioke Jorde. Verrette "Steve"'s (Prasterone, DHEA, (DHEA PO)), methylPREDNISolone, vitamin E, sildenafil, and meloxicam. I have also changed his testosterone. Additionally, I am having him start on Shingrix and sildenafil. Lastly, I am having him maintain his multivitamin with minerals, Fish Oil, cholecalciferol, TURMERIC PO, esomeprazole, (Flaxseed, Linseed, (FLAX SEED OIL PO)), Coenzyme Q10 (CO Q 10 PO), fluticasone, traMADol, diltiazem, ipratropium, montelukast, diltiazem, Breo Ellipta, CHELATED MAGNESIUM PO, vitamin B-12, carvedilol, rosuvastatin, and olmesartan.  Meds ordered this encounter  Medications   Zoster  Vaccine Adjuvanted [facility]) injection    Sig: Inject 0.5 mLs into the muscle once for 1 dose.    Dispense:  0.5 mL    Refill:  1   sildenafil (VIAGRA) 100 MG tablet    Sig: Take 1 tablet (100 mg total) by mouth daily as needed for erectile dysfunction.    Dispense:  10 tablet    Refill:  4   testosterone (ANDROGEL) 50 MG/5GM (1%) GEL    Sig: Place 10 g onto the skin daily.    Dispense:  900 g    Refill:  1     Follow-up: Return in about 6 months (around 11/20/2022).  Scarlette Calico, MD

## 2022-05-23 ENCOUNTER — Encounter: Payer: Self-pay | Admitting: Internal Medicine

## 2022-05-23 ENCOUNTER — Other Ambulatory Visit: Payer: Self-pay

## 2022-05-23 ENCOUNTER — Encounter: Payer: Self-pay | Admitting: Pulmonary Disease

## 2022-05-23 DIAGNOSIS — R918 Other nonspecific abnormal finding of lung field: Secondary | ICD-10-CM

## 2022-05-23 LAB — TESTOSTERONE TOTAL,FREE,BIO, MALES
Albumin: 4.3 g/dL (ref 3.6–5.1)
Sex Hormone Binding: 25 nmol/L (ref 22–77)
Testosterone, Bioavailable: 82.7 ng/dL — ABNORMAL LOW (ref 110.0–575.0)
Testosterone, Free: 42 pg/mL — ABNORMAL LOW (ref 46.0–224.0)
Testosterone: 266 ng/dL (ref 250–827)

## 2022-05-23 MED ORDER — TESTOSTERONE 50 MG/5GM (1%) TD GEL: 10.0000 g | Freq: Every day | TRANSDERMAL | 1 refills | Status: DC

## 2022-05-24 DIAGNOSIS — H524 Presbyopia: Secondary | ICD-10-CM | POA: Diagnosis not present

## 2022-05-24 DIAGNOSIS — H47323 Drusen of optic disc, bilateral: Secondary | ICD-10-CM | POA: Diagnosis not present

## 2022-05-24 DIAGNOSIS — H02831 Dermatochalasis of right upper eyelid: Secondary | ICD-10-CM | POA: Diagnosis not present

## 2022-05-24 DIAGNOSIS — H43813 Vitreous degeneration, bilateral: Secondary | ICD-10-CM | POA: Diagnosis not present

## 2022-05-24 DIAGNOSIS — H52223 Regular astigmatism, bilateral: Secondary | ICD-10-CM | POA: Diagnosis not present

## 2022-05-24 DIAGNOSIS — H02834 Dermatochalasis of left upper eyelid: Secondary | ICD-10-CM | POA: Diagnosis not present

## 2022-05-24 DIAGNOSIS — H5213 Myopia, bilateral: Secondary | ICD-10-CM | POA: Diagnosis not present

## 2022-05-25 ENCOUNTER — Other Ambulatory Visit: Payer: Self-pay | Admitting: *Deleted

## 2022-05-25 MED ORDER — BREO ELLIPTA 100-25 MCG/ACT IN AEPB: 1.0000 | INHALATION_SPRAY | Freq: Every day | RESPIRATORY_TRACT | 5 refills | Status: DC

## 2022-05-26 ENCOUNTER — Ambulatory Visit
Admission: RE | Admit: 2022-05-26 | Discharge: 2022-05-26 | Disposition: A | Discharge status: Home / Self Care | Payer: Medicare Other | Source: Ambulatory Visit | Attending: Family Medicine | Admitting: Family Medicine

## 2022-05-26 DIAGNOSIS — M4722 Other spondylosis with radiculopathy, cervical region: Secondary | ICD-10-CM | POA: Diagnosis not present

## 2022-05-26 DIAGNOSIS — M5412 Radiculopathy, cervical region: Secondary | ICD-10-CM

## 2022-05-26 MED ORDER — IOPAMIDOL (ISOVUE-M 300) INJECTION 61%
1.0000 mL | Freq: Once | INTRAMUSCULAR | Status: AC | PRN
  Administered 2022-05-26: 1 mL via EPIDURAL

## 2022-05-26 MED ORDER — TRIAMCINOLONE ACETONIDE 40 MG/ML IJ SUSP (RADIOLOGY)
60.0000 mg | Freq: Once | INTRAMUSCULAR | Status: AC
  Administered 2022-05-26: 60 mg via EPIDURAL

## 2022-05-26 NOTE — Discharge Instructions (Signed)

## 2022-05-29 ENCOUNTER — Other Ambulatory Visit: Payer: Self-pay | Admitting: Internal Medicine

## 2022-05-29 DIAGNOSIS — G8929 Other chronic pain: Secondary | ICD-10-CM

## 2022-05-29 DIAGNOSIS — M5010 Cervical disc disorder with radiculopathy, unspecified cervical region: Secondary | ICD-10-CM

## 2022-06-02 ENCOUNTER — Encounter: Payer: Self-pay | Admitting: Internal Medicine

## 2022-06-04 ENCOUNTER — Encounter: Payer: Self-pay | Admitting: Family Medicine

## 2022-06-04 ENCOUNTER — Other Ambulatory Visit: Payer: Self-pay | Admitting: Internal Medicine

## 2022-06-04 DIAGNOSIS — G8929 Other chronic pain: Secondary | ICD-10-CM

## 2022-06-04 DIAGNOSIS — M5010 Cervical disc disorder with radiculopathy, unspecified cervical region: Secondary | ICD-10-CM

## 2022-06-04 MED ORDER — TIZANIDINE HCL 4 MG PO TABS: 4.0000 mg | ORAL_TABLET | Freq: Three times a day (TID) | ORAL | 0 refills | Status: DC | PRN

## 2022-06-05 NOTE — Progress Notes (Unsigned)
   [name], PhD, LAT, ATC acting as a scribe for [name], MD.  John [name] is a 67 y.o. male who presents to [place] at [facility] today for bilateral shoulder pain.  Patient was last seen by Dr. Tamala Julian on 04/11/2022 and was given bilateral AC joint steroid injections and has a history of cervical radiculopathy.  Pt's last cervical ESI was on 05/26/22. Today, patient reports***  Neck pain: Radiates: UE numbness/tingling UE weakness: Aggravates: Treatments tried: Prior AC joint steroid injections,  Dx imaging: 03/22/2020 C-spine MRI  03/09/2020 C-spine x-ray  Pertinent review of systems: ***  Relevant historical information: ***   Exam:  There were no vitals taken for this visit. General: Well Developed, well nourished, and in no acute distress.   MSK: ***    Lab and Radiology Results No results found for this or any previous visit (from the past 72 hour(s)). No results found.     Assessment and Plan: 67 y.o. male with ***   PDMP not reviewed this encounter. No orders of the defined types were placed in this encounter.  No orders of the defined types were placed in this encounter.    Discussed warning signs or symptoms. Please see discharge instructions. Patient expresses understanding.   ***

## 2022-06-06 ENCOUNTER — Ambulatory Visit: Payer: Self-pay

## 2022-06-06 ENCOUNTER — Ambulatory Visit: Payer: Medicare Other | Admitting: Family Medicine

## 2022-06-06 ENCOUNTER — Encounter: Payer: Self-pay | Admitting: Family Medicine

## 2022-06-06 ENCOUNTER — Ambulatory Visit (INDEPENDENT_AMBULATORY_CARE_PROVIDER_SITE_OTHER): Payer: Medicare Other

## 2022-06-06 VITALS — BP 142/96 | HR 90 | Ht 71.0 in | Wt 209.4 lb

## 2022-06-06 DIAGNOSIS — M25511 Pain in right shoulder: Secondary | ICD-10-CM | POA: Diagnosis not present

## 2022-06-06 DIAGNOSIS — M19011 Primary osteoarthritis, right shoulder: Secondary | ICD-10-CM | POA: Diagnosis not present

## 2022-06-06 DIAGNOSIS — M25512 Pain in left shoulder: Secondary | ICD-10-CM

## 2022-06-06 DIAGNOSIS — G8929 Other chronic pain: Secondary | ICD-10-CM

## 2022-06-06 DIAGNOSIS — M19012 Primary osteoarthritis, left shoulder: Secondary | ICD-10-CM | POA: Diagnosis not present

## 2022-06-06 NOTE — Patient Instructions (Addendum)
Thank you for coming in today.  Please get an Xray today before you leave   You received an injection today. Seek immediate medical attention if the joint becomes red, extremely painful, or is oozing fluid.   If not better, we can do an injection in a different part of the shoulder as early as next week.  Let me know how it goes.

## 2022-06-07 ENCOUNTER — Other Ambulatory Visit (HOSPITAL_COMMUNITY): Payer: Self-pay

## 2022-06-07 ENCOUNTER — Telehealth: Payer: Self-pay

## 2022-06-07 ENCOUNTER — Encounter: Payer: Self-pay | Admitting: Internal Medicine

## 2022-06-07 NOTE — Progress Notes (Signed)
Left shoulder x-ray shows some arthritis changes.

## 2022-06-07 NOTE — Telephone Encounter (Signed)
Pharmacy Patient Advocate Encounter   Received notification that prior authorization for Testosterone 50 MG/5GM(1%) gel is required/requested.  Per Test Claim: PA required   PA submitted on 06/07/22 to (ins) [place] [employer] via CoverMyMeds [id number]  Status is pending

## 2022-06-07 NOTE — Telephone Encounter (Signed)
Patient Advocate Encounter  Prior Authorization for Testosterone 50 MG/5GM(1%) gel has been approved through [employer].    John [name]    Effective: 06-06-2022 to 06-08-2023

## 2022-06-07 NOTE — Progress Notes (Signed)
Right shoulder x-ray shows some arthritis changes.

## 2022-06-13 NOTE — Progress Notes (Signed)
   I, Philbert Riser, LAT, ATC acting as a scribe for Clementeen Graham, MD.  John [name] is a 67 y.o. male who presents to [facility] at [facility] today for f/u bilat shoulder injection.  He works in Agricultural consultant.  Pt was last seen by Dr. Denyse Amass on 06/06/2022 and was given a right subacromial steroid injection. Today, pt reports pain remains in the anterior aspect of the R shoulder. Pt is wanting to try the a different injection in the R shoulder (glenohumeral approach).   Dx imaging: 06/06/22 R & L shoulder XR 03/22/2020 C-spine MRI             03/09/2020 C-spine x-ray  Pertinent review of systems: No fevers or chills  Relevant historical information: COPD.   Exam:  BP (!) 148/96   Pulse 96   Ht 5\' 11"  (1.803 m)   Wt 211 lb (95.7 kg)   SpO2 96%   BMI 29.43 kg/m  General: Well Developed, well nourished, and in no acute distress.   MSK: Right shoulder: Normal-appearing Normal motion pain with abduction.    Lab and Radiology Results  Procedure: Real-time Ultrasound Guided Injection of the right shoulder glenohumeral joint posterior approach Device: Philips Affiniti 50G Images permanently stored and available for review in PACS Verbal informed consent obtained.  Discussed risks and benefits of procedure. Warned about infection, bleeding, hyperglycemia damage to structures among others. Patient expresses understanding and agreement Time-out conducted.   Noted no overlying erythema, induration, or other signs of local infection.   Skin prepped in a sterile fashion.   Local anesthesia: Topical Ethyl chloride.   With sterile technique and under real time ultrasound guidance: 40 mg of Kenalog and 2 mL Marcaine injected into glenohumeral joint. Fluid seen entering the joint capsule.   Completed without difficulty   Pain moderately resolved suggesting accurate placement of the medication.   Advised to call if fevers/chills, erythema, induration,  drainage, or persistent bleeding.   Images permanently stored and available for review in the ultrasound unit.  Impression: Technically successful ultrasound guided injection.         Assessment and Plan: 67 y.o. male with right shoulder pain thought to be due to shoulder impingement and possible shoulder joint effusion based on the appearance of the x-ray.  He had moderate improvement with subacromial injection.  I am hopeful that with glenohumeral injection he will have have further improvement.   PDMP not reviewed this encounter. Orders Placed This Encounter  Procedures   [place] LIMITED JOINT SPACE STRUCTURES UP BILAT(NO LINKED CHARGES)    Order Specific Question:   Reason for Exam (SYMPTOM  OR DIAGNOSIS REQUIRED)    Answer:   bilateral shoulder pain    Order Specific Question:   Preferred imaging location?    Answer:   [facility]   No orders of the defined types were placed in this encounter.    Discussed warning signs or symptoms. Please see discharge instructions. Patient expresses understanding.   The above documentation has been reviewed and is accurate and complete Clementeen Graham, M.D.

## 2022-06-14 ENCOUNTER — Ambulatory Visit: Payer: Self-pay

## 2022-06-14 ENCOUNTER — Ambulatory Visit: Payer: Medicare Other | Admitting: Family Medicine

## 2022-06-14 VITALS — BP 148/96 | HR 96 | Ht 71.0 in | Wt 211.0 lb

## 2022-06-14 DIAGNOSIS — H524 Presbyopia: Secondary | ICD-10-CM | POA: Diagnosis not present

## 2022-06-14 DIAGNOSIS — M25512 Pain in left shoulder: Secondary | ICD-10-CM

## 2022-06-14 DIAGNOSIS — G8929 Other chronic pain: Secondary | ICD-10-CM | POA: Diagnosis not present

## 2022-06-14 DIAGNOSIS — M25511 Pain in right shoulder: Secondary | ICD-10-CM

## 2022-06-14 NOTE — Patient Instructions (Addendum)
Thank you for coming in today.   You received an injection today. Seek immediate medical attention if the joint becomes red, extremely painful, or is oozing fluid.   Lets see how it goes.

## 2022-06-15 DIAGNOSIS — Z1211 Encounter for screening for malignant neoplasm of colon: Secondary | ICD-10-CM | POA: Diagnosis not present

## 2022-06-19 ENCOUNTER — Encounter
Payer: Medicare Other | Attending: Physical Medicine and Rehabilitation | Admitting: Physical Medicine and Rehabilitation

## 2022-06-19 ENCOUNTER — Encounter: Payer: Self-pay | Admitting: Physical Medicine and Rehabilitation

## 2022-06-19 VITALS — BP 143/90 | HR 89 | Ht 71.0 in | Wt 209.0 lb

## 2022-06-19 DIAGNOSIS — M5010 Cervical disc disorder with radiculopathy, unspecified cervical region: Secondary | ICD-10-CM | POA: Insufficient documentation

## 2022-06-19 DIAGNOSIS — M7918 Myalgia, other site: Secondary | ICD-10-CM | POA: Insufficient documentation

## 2022-06-19 DIAGNOSIS — M4800 Spinal stenosis, site unspecified: Secondary | ICD-10-CM | POA: Diagnosis not present

## 2022-06-19 MED ORDER — NALTREXONE HCL 50 MG PO TABS: 25.0000 mg | ORAL_TABLET | Freq: Every day | ORAL | 5 refills | Status: DC

## 2022-06-19 NOTE — Progress Notes (Signed)
Subjective:    Patient ID: John [name], male    DOB: 26-Mar-1956, 67 y.o.   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    Anesthesia won't allow pt to get surgery of isi neck until has stress test- and they cancelled surgery in December due to drop in BP MRI cervical spine 03/22/20- to get done in this upcoming December-   Gets cervical epidural q2 months- only last 5-6 weeks- and then gets short course of prednisone.   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    They mentioned Naltrexone for pain-  Tried: gabapentin- weird awful dreams  Been on Tramadol- for 4-5 years- only takes 1 pill/day-  Doesn't do anything for cervical neck pain- nerve pain-  Messing with his sleep. When it's hurting so bad.  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   Pain Inventory Average Pain 6 Pain Right Now 1 My pain is intermittent, dull, tingling, and aching  In the last 24 hours, has pain interfered with the following? General activity 2 Relation with others 0 Enjoyment of life 2 What TIME of day is your pain at its worst? night Sleep (in general) Poor  Pain is worse with: sitting, inactivity, and some activites Pain improves with: medication and injections Relief from Meds: 8  how many  minutes can you walk? No problem ability to climb steps?  yes do you drive?  yes  employed # of hrs/week 25 hours per week [facility] Do you have any goals in this area?  yes  numbness tingling anxiety  Any changes since last visit?  yes x-rays Right shoulder xray, Epidural injection 3 weeks ago  Any changes since last visit?  no    Family History  Problem Relation Age of Onset   Hypertension Mother    Dementia Other    Hypertension Sister    Hypertension Brother    Social History   Socioeconomic History   Marital status: Divorced    Spouse name: Not on file   Number of children: Not on file   Years of education: Not on file   Highest education level: Not on file  Occupational History   Not on file  Tobacco Use   Smoking status: Former    Packs/day: 1.50    Years: 38.00    Total pack years: 57.00    Types: Cigarettes    Quit date: 2015    Years since quitting: 9.1   Smokeless tobacco: Never  Vaping Use   Vaping Use: Every day   Substances: Nicotine  Substance and Sexual Activity   Alcohol use: Not Currently   Drug use: No   Sexual activity: Yes    Partners: Female  Other Topics Concern  Not on file  Social History Narrative   Not on file   Social Determinants of Health   Financial Resource Strain: Not on file  Food Insecurity: Not on file  Transportation Needs: Not on file  Physical Activity: Not on file  Stress: Not on file  Social Connections: Not on file   Past Surgical History:  Procedure Laterality Date   BONE CYST EXCISION  1991   left wrist   COLONOSCOPY     HERNIA REPAIR  04/14/11   right inguinal   HERNIA REPAIR     left inguinal, december 2020   Past Medical History:  Diagnosis Date   Afib ([place])    Anxiety and depression    Asthma    Emphysema of lung ([place])    GERD (gastroesophageal reflux disease)    Hernia    right inguinal   History of bronchitis    Hyperlipidemia    Hypertension    Morton's neuroma    OA  (osteoarthritis)    neck   Obstructive sleep apnea on CPAP    Pancreatitis    Torn meniscus    Left   BP (!) 143/90   Pulse 89   Ht '5\' 11"'$  (1.803 m)   Wt 209 lb (94.8 kg)   SpO2 97%   BMI 29.15 kg/m   Opioid Risk Score:   Fall Risk Score:  `1  Depression screen [facility] 2/9     06/19/2022    2:44 PM 01/30/2022    9:17 AM 05/16/2021   11:15 AM 05/06/2020    9:48 AM 04/22/2018    9:11 AM  Depression screen PHQ 2/9  Decreased Interest 0 0 0 0 0  Down, Depressed, Hopeless 1 0 0 0 0  PHQ - 2 Score 1 0 0 0 0  Altered sleeping 2 0     Tired, decreased energy 1 0     Change in appetite 1 0     Feeling bad or failure about yourself  0 0     Trouble concentrating 0 0     Moving slowly or fidgety/restless 0 0     Suicidal thoughts 0 0     PHQ-9 Score 5 0     Difficult doing work/chores  Not difficult at all       Review of Systems  Musculoskeletal:  Positive for neck pain.       Right shoulder pain and right arm and right hand pain  All other systems reviewed and are negative.      Objective:   Physical Exam Awake, alert, appropriate, sitting and standing, NAD  MS:  strength 5/5 in Ue's- B/L- deltoids, were 4/5; but otherwise 5/5 in biceps, triceps, WE< grip and FA   Neuro: Intact to light touch in UE's B/L   Trigger points in scalenes, levators, upper traps rhomboids, and splenius capitus.        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    Discussed options- doing compounded  low dose Naltrexone-  But decided to try Naltrexone- try 12.5 to 25 mg nightly-  the lower the dose, usually the better the response- always take 1x/day- at night- can help with sleep.  Wait on low dose naltrexone- and know have to stop Tramadol to take Naltrexone.  Usually can help sleep-  since can make you sleepy- usually  doesn't make sleepy during day-  A few patients can tell me that cause anxiety- but it's rare  2.  If this doesn't work enough for sleep, have other options. Let me know how things work.    3.   Theracane - can get [employer]- hold pressure  x 2-4 minutes- no massage- to hold onto trigger points- will work better AFTER trigger point injections to maintain relaxation.  4. If doesn't work- let me know and we will try something else other than Naltrexone.    5. Will get in wait list for trigger point injections.    6. F/U ASAP for trigger point injections. But also make appointment 3 months.   7. Last ESI was 3 weeks ago- so hopefully if can get in TrP injections in 3 weeks or so.   8. Added gabapentin to allergy list  I spent a total of  38  minutes on total care today- >50% coordination of care- due to education on theracane and trigger point injections and  and naltrexone

## 2022-06-19 NOTE — Patient Instructions (Addendum)
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    Discussed options- doing compounded  low dose Naltrexone-  But decided to try Naltrexone- try 12.5 to 25 mg nightly-  the lower the dose, usually the better the response- always take 1x/day- at night- can help with sleep.  Wait on low dose naltrexone- and know have to stop Tramadol to take Naltrexone.  Usually can help sleep- since can make you sleepy- usually doesn't make sleepy during day-  A few patients can tell me that cause anxiety- but it's rare  2.  If this doesn't work enough for sleep, have other options. Let me know how things work.    3.   Theracane - can get [employer]- hold pressure  x 2-4 minutes- no massage- to hold onto trigger points- will work better AFTER trigger point injections to maintain relaxation.  4. If doesn't work- let me know and we will try something else other than Naltrexone.    5. Will get in wait list for trigger point injections.    6. F/U ASAP for trigger point injections. But also make appointment 3 months.    7. Last ESI 3 weeks ago- so hopefully can get in TrP injections in next 3 weeks or so  8. Added gabapentin to allergy list

## 2022-06-20 ENCOUNTER — Encounter: Payer: Self-pay | Admitting: Physical Medicine and Rehabilitation

## 2022-06-20 ENCOUNTER — Other Ambulatory Visit: Payer: Commercial Managed Care - HMO

## 2022-06-21 ENCOUNTER — Encounter (HOSPITAL_BASED_OUTPATIENT_CLINIC_OR_DEPARTMENT_OTHER): Payer: Self-pay

## 2022-06-22 NOTE — Telephone Encounter (Signed)
Please advise 

## 2022-06-23 ENCOUNTER — Other Ambulatory Visit: Payer: Self-pay | Admitting: Internal Medicine

## 2022-06-23 DIAGNOSIS — R195 Other fecal abnormalities: Secondary | ICD-10-CM

## 2022-06-23 LAB — COLOGUARD: COLOGUARD: POSITIVE — AB

## 2022-06-26 ENCOUNTER — Encounter: Payer: Self-pay | Admitting: Internal Medicine

## 2022-06-27 ENCOUNTER — Encounter: Payer: Self-pay | Admitting: Family Medicine

## 2022-06-27 NOTE — Telephone Encounter (Signed)
Forwarding to Dr. Corey to review and advise.  

## 2022-06-28 NOTE — Telephone Encounter (Signed)
Patient is scheduled with Dr Glennon Mac on Monday.

## 2022-06-30 NOTE — Progress Notes (Unsigned)
Benito Mccreedy D.[place] Phone: [phone]   Assessment and Plan:     There are no diagnoses linked to this encounter.  ***   Pertinent previous records reviewed include ***   Follow Up: ***     Subjective:   I, Taje Littler, am serving as a Education administrator for Doctor Glennon Mac  Chief Complaint: right shoulder pain   HPI:  06/14/2022 John [name] is a 67 y.o. male who presents to [place] at [facility] today for f/u bilat shoulder injection.  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  Pt was last seen by Dr. Georgina Snell on 06/06/2022 and was given a right subacromial steroid injection. Today, pt reports pain remains in the anterior aspect of the R shoulder. Pt is wanting to try the a different injection in the R shoulder (glenohumeral approach).    Dx imaging: 06/06/22 R & L shoulder XR 03/22/2020 C-spine MRI             03/09/2020 C-spine x-ray   06/30/22 Patient states   Relevant Historical Information: ***  Additional pertinent review of systems negative.   Current Outpatient Medications:    BREO ELLIPTA 100-25 MCG/ACT AEPB, Inhale 1 puff into the lungs daily., Disp: 60 each, Rfl: 5   carvedilol (COREG) 3.125 MG tablet, TAKE 1 TABLET(3.125 MG) BY MOUTH TWICE DAILY WITH A MEAL, Disp: 180 tablet, Rfl: 0   CHELATED MAGNESIUM PO, Take 200 mg by mouth in the morning., Disp: , Rfl:    cholecalciferol (VITAMIN D3) 25 MCG (1000 UT) tablet, Take 1,000 Units by mouth every evening., Disp: , Rfl:    Coenzyme Q10 (CO Q 10 PO), Take 400 mg by mouth in the morning., Disp: , Rfl:    diltiazem (CARDIZEM CD) 120 MG 24 hr capsule, TAKE 1 CAPSULE(120 MG) BY MOUTH DAILY (Patient taking differently: Take 120 mg by mouth every evening.), Disp: 90 capsule, Rfl: 1   diltiazem (CARDIZEM) 30 MG tablet, TAKE 1 TABLET BY MOUTH EVERY 4 HOURS AS NEEDED HR ABOVE 100 AND TOP NUMBER B/P GREATER THAN 100  (Patient not taking: Reported on 06/19/2022), Disp: 30 tablet, Rfl: 1   esomeprazole (NEXIUM) 20 MG capsule, Take 1 capsule (20 mg total) by mouth daily at 12 noon. (Patient taking differently: Take 20 mg by mouth every evening.), Disp: 90 capsule, Rfl: 1   Flaxseed, Linseed, (FLAX SEED OIL PO), Take 1 tablet by mouth every morning., Disp: , Rfl:    fluticasone (FLONASE) 50 MCG/ACT nasal spray, Place 2 sprays into both nostrils in the morning and at bedtime., Disp: , Rfl:    montelukast (SINGULAIR) 10 MG tablet, TAKE 1 TABLET(10 MG) BY MOUTH DAILY, Disp: 90 tablet, Rfl: 1   Multiple Vitamins-Minerals (MULTIVITAMIN WITH MINERALS) tablet, Take 1 tablet by mouth in the morning., Disp: , Rfl:    naltrexone (DEPADE) 50 MG tablet, Take 0.5 tablets (25 mg total) by mouth at bedtime., Disp: 15 tablet, Rfl: 5   olmesartan (BENICAR) 40 MG tablet, TAKE 1 TABLET(40 MG) BY MOUTH DAILY, Disp: 90 tablet, Rfl: 0   Omega-3 Fatty Acids (FISH OIL) 1200 MG CAPS, Take 1 capsule by mouth in the morning and at bedtime., Disp: , Rfl:    rosuvastatin (CRESTOR) 20 MG tablet, TAKE 1 TABLET(20 MG) BY MOUTH DAILY, Disp: 90 tablet, Rfl: 1   sildenafil (VIAGRA) 100 MG tablet, Take 1 tablet (100 mg total) by mouth daily as needed  for erectile dysfunction., Disp: 10 tablet, Rfl: 4   testosterone (ANDROGEL) 50 MG/5GM (1%) GEL, Place 10 g onto the skin daily., Disp: 900 g, Rfl: 1   tiZANidine (ZANAFLEX) 4 MG tablet, Take 1 tablet (4 mg total) by mouth every 8 (eight) hours as needed for muscle spasms., Disp: 270 tablet, Rfl: 0   traMADol (ULTRAM) 50 MG tablet, TAKE 1 TABLET BY MOUTH TWICE A DAY, Disp: 60 tablet, Rfl: 0   TURMERIC PO, Take 800 mg by mouth 2 (two) times daily., Disp: , Rfl:    vitamin B-12 (CYANOCOBALAMIN) 500 MCG tablet, Take 500 mcg by mouth in the morning., Disp: , Rfl:    Objective:     There were no vitals filed for this visit.    There is no height or weight on file to calculate BMI.    Physical Exam:     ***   Electronically signed by:  Benito Mccreedy D.[facility] 1:48 PM 06/30/22

## 2022-07-03 ENCOUNTER — Ambulatory Visit: Payer: Medicare Other | Admitting: Sports Medicine

## 2022-07-03 ENCOUNTER — Other Ambulatory Visit: Payer: Self-pay

## 2022-07-03 VITALS — BP 132/80 | HR 87 | Ht 71.0 in | Wt 217.0 lb

## 2022-07-03 DIAGNOSIS — M19011 Primary osteoarthritis, right shoulder: Secondary | ICD-10-CM

## 2022-07-03 DIAGNOSIS — G8929 Other chronic pain: Secondary | ICD-10-CM

## 2022-07-03 DIAGNOSIS — M25511 Pain in right shoulder: Secondary | ICD-10-CM

## 2022-07-03 NOTE — Patient Instructions (Addendum)
Good to see you  3-4 week follow up  

## 2022-07-04 ENCOUNTER — Other Ambulatory Visit: Payer: Self-pay | Admitting: Internal Medicine

## 2022-07-04 DIAGNOSIS — I10 Essential (primary) hypertension: Secondary | ICD-10-CM

## 2022-07-06 ENCOUNTER — Encounter: Payer: Self-pay | Admitting: Sports Medicine

## 2022-07-12 NOTE — Progress Notes (Unsigned)
John [name] [address] [place] Phone: [phone] Subjective:   [name], am serving as a scribe for Dr. Hulan Saas.  I'm seeing this patient by the request  of:  Janith Lima, MD  CC: back pain   [id number]  04/11/2022 Significant arthritic changes noted.  Patient may not be able to actually have the surgery this year unless patient does get his stress test and gets approval for surgery in the near future.  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  Will refer patient over to pain management in the interim to see if low-dose naltrexone would work in case this would have to wait for a year for surgical intervention.  Will place referral for the cardiovascular referral for the stress test as well.  Follow-up with me again when needed if epidurals are necessary.  Continue and encourage patient to continue the exercises     Bilateral injections given today.  Will see if this is contributing to some of the arm pain that he is having.  We discussed with patient about icing regimen and home exercises.  Discussed try to keep hands within peripheral vision.  Discussed scapular strengthening exercises.  Follow-up with me again in 6 to 8 weeks.      Update 07/13/2022 John [name] is a 67 y.o. male coming in with complaint of R sided back pain. Patient states pain in low back that radiates into groin. Has gotten worse over the last several days. Two tylenol and 2 tramadol and 500mg  naproxen in the morning was sufficing and back intensity decreased within 30 mins and lasted all day. That is no longer working. Was having similar pain on the left side, but that only lasted a day or so. Most pain is on the right side. Worse in the morning. Can wake him at night       Past Medical History:  Diagnosis Date   Afib ([place])    Anxiety and depression    Asthma     Emphysema of lung ([facility])    GERD (gastroesophageal reflux disease)    Hernia    right inguinal   History of bronchitis    Hyperlipidemia    Hypertension    Morton's neuroma    OA (osteoarthritis)    neck   Obstructive sleep apnea on CPAP    Pancreatitis    Torn meniscus    Left   Past Surgical History:  Procedure Laterality Date   BONE CYST EXCISION  1991   left wrist   COLONOSCOPY     HERNIA REPAIR  04/14/11   right inguinal   HERNIA REPAIR     left inguinal, december 2020   Social History   Socioeconomic History   Marital status: Divorced    Spouse name: Not on file   Number of children: Not on file   Years of education: Not on file   Highest education level: Not on file  Occupational History   Not on file  Tobacco Use   Smoking status: Former    Packs/day: 1.50    Years: 38.00    Additional pack years: 0.00    Total pack years: 57.00    Types: Cigarettes    Quit date: 2015    Years since quitting: 9.2   Smokeless tobacco: Never  Vaping Use   Vaping Use:  Every day   Substances: Nicotine  Substance and Sexual Activity   Alcohol use: Not Currently   Drug use: No   Sexual activity: Yes    Partners: Female  Other Topics Concern   Not on file  Social History Narrative   Not on file   Social Determinants of Health   Financial Resource Strain: Not on file  Food Insecurity: Not on file  Transportation Needs: Not on file  Physical Activity: Not on file  Stress: Not on file  Social Connections: Not on file   Allergies  Allergen Reactions   Aprindine Other (See Comments)   Gabapentin Palpitations    And severe anxiety and Nausea/vomiting   Family History  Problem Relation Age of Onset   Hypertension Mother    Dementia Other    Hypertension Sister    Hypertension Brother     Current Outpatient Medications (Endocrine & Metabolic):    testosterone (ANDROGEL) 50 MG/5GM (1%) GEL, Place 10 g onto the skin daily.  Current Outpatient Medications  (Cardiovascular):    carvedilol (COREG) 3.125 MG tablet, TAKE 1 TABLET(3.125 MG) BY MOUTH TWICE DAILY WITH A MEAL   diltiazem (CARDIZEM CD) 120 MG 24 hr capsule, TAKE 1 CAPSULE(120 MG) BY MOUTH DAILY (Patient taking differently: Take 120 mg by mouth every evening.)   diltiazem (CARDIZEM) 30 MG tablet, TAKE 1 TABLET BY MOUTH EVERY 4 HOURS AS NEEDED HR ABOVE 100 AND TOP NUMBER B/P GREATER THAN 100   olmesartan (BENICAR) 40 MG tablet, TAKE 1 TABLET(40 MG) BY MOUTH DAILY   rosuvastatin (CRESTOR) 20 MG tablet, TAKE 1 TABLET(20 MG) BY MOUTH DAILY   sildenafil (VIAGRA) 100 MG tablet, Take 1 tablet (100 mg total) by mouth daily as needed for erectile dysfunction.  Current Outpatient Medications (Respiratory):    BREO ELLIPTA 100-25 MCG/ACT AEPB, Inhale 1 puff into the lungs daily.   fluticasone (FLONASE) 50 MCG/ACT nasal spray, Place 2 sprays into both nostrils in the morning and at bedtime.   montelukast (SINGULAIR) 10 MG tablet, TAKE 1 TABLET(10 MG) BY MOUTH DAILY  Current Outpatient Medications (Analgesics):    naproxen (NAPROSYN) 500 MG tablet, Take 1 tablet (500 mg total) by mouth 2 (two) times daily as needed.   traMADol (ULTRAM) 50 MG tablet, TAKE 1 TABLET BY MOUTH TWICE A DAY  Current Outpatient Medications (Hematological):    vitamin B-12 (CYANOCOBALAMIN) 500 MCG tablet, Take 500 mcg by mouth in the morning.  Current Outpatient Medications (Other):    CHELATED MAGNESIUM PO, Take 200 mg by mouth in the morning.   cholecalciferol (VITAMIN D3) 25 MCG (1000 UT) tablet, Take 1,000 Units by mouth every evening.   Coenzyme Q10 (CO Q 10 PO), Take 400 mg by mouth in the morning.   esomeprazole (NEXIUM) 20 MG capsule, Take 1 capsule (20 mg total) by mouth daily at 12 noon. (Patient taking differently: Take 20 mg by mouth every evening.)   Flaxseed, Linseed, (FLAX SEED OIL PO), Take 1 tablet by mouth every morning.   Multiple Vitamins-Minerals (MULTIVITAMIN WITH MINERALS) tablet, Take 1 tablet by  mouth in the morning.   naltrexone (DEPADE) 50 MG tablet, Take 0.5 tablets (25 mg total) by mouth at bedtime.   Omega-3 Fatty Acids (FISH OIL) 1200 MG CAPS, Take 1 capsule by mouth in the morning and at bedtime.   tiZANidine (ZANAFLEX) 4 MG tablet, Take 1 tablet (4 mg total) by mouth every 8 (eight) hours as needed for muscle spasms.   TURMERIC PO, Take 800 mg  by mouth 2 (two) times daily.   Reviewed prior external information including notes and imaging from  primary care provider As well as notes that were available from care everywhere and other healthcare systems.  Past medical history, social, surgical and family history all reviewed in electronic medical record.  No pertanent information unless stated regarding to the chief complaint.   Review of Systems:  No headache, visual changes, nausea, vomiting, diarrhea, constipation, dizziness, abdominal pain, skin rash, fevers, chills, night sweats, weight loss, swollen lymph nodes, body aches, joint swelling, chest pain, shortness of breath, mood changes. POSITIVE muscle aches  Objective  Blood pressure (!) 152/92, pulse (!) 105, height 5\' 11"  (1.803 m), weight 210 lb (95.3 kg), SpO2 97 %.   General: No apparent distress alert and oriented x3 mood and affect normal, dressed appropriately.  HEENT: Pupils equal, extraocular movements intact  Respiratory: Patient's speak in full sentences and does not appear short of breath  Cardiovascular: No lower extremity edema, non tender, no erythema  Low back exam does have some loss lordosis with some degenerative scoliosis noted.  Patient does have midline tenderness mostly at the L2 and L3 area.  This is pinpoint tenderness noted.  No CVA tenderness.  Does have some discomfort in the sacroiliac joints bilateral but minimal compared to the upper part.  Significant tightness with straight leg test bilaterally.  Some voluntary guarding noted.  No weakness of the lower extremities.  Questionable weakness  though noted of the hip flexion on the right side.  Good internal range of motion and external range of motion of the hips bilaterally    Impression and Recommendations:    The above documentation has been reviewed and is accurate and complete Lyndal Pulley, DO

## 2022-07-13 ENCOUNTER — Ambulatory Visit: Payer: Medicare Other | Admitting: Family Medicine

## 2022-07-13 ENCOUNTER — Encounter: Payer: Self-pay | Admitting: Family Medicine

## 2022-07-13 ENCOUNTER — Ambulatory Visit (INDEPENDENT_AMBULATORY_CARE_PROVIDER_SITE_OTHER): Payer: Medicare Other

## 2022-07-13 VITALS — BP 152/92 | HR 105 | Ht 71.0 in | Wt 210.0 lb

## 2022-07-13 DIAGNOSIS — M545 Low back pain, unspecified: Secondary | ICD-10-CM | POA: Diagnosis not present

## 2022-07-13 DIAGNOSIS — M5136 Other intervertebral disc degeneration, lumbar region: Secondary | ICD-10-CM | POA: Diagnosis not present

## 2022-07-13 DIAGNOSIS — M5416 Radiculopathy, lumbar region: Secondary | ICD-10-CM | POA: Diagnosis not present

## 2022-07-13 MED ORDER — KETOROLAC TROMETHAMINE 60 MG/2ML IM SOLN
60.0000 mg | Freq: Once | INTRAMUSCULAR | Status: AC
  Administered 2022-07-13: 60 mg via INTRAMUSCULAR

## 2022-07-13 MED ORDER — NAPROXEN 500 MG PO TABS: 500.0000 mg | ORAL_TABLET | Freq: Two times a day (BID) | ORAL | 1 refills | Status: DC | PRN

## 2022-07-13 NOTE — Assessment & Plan Note (Signed)
Patient is having mild atrophy of the lower extremities that is concerning.  In addition to this patient does have midline tenderness noted.  X-rays do show significant sclerotic changes and spurring over the L2 vertebrae and does have an abnormality noted of the anterior aspect.  With all these findings and affecting daily activities with some intermittent radicular symptoms into the groin area I am concerned for some instability of the lumbar spine.  Do feel we should consider advanced imaging with this.  Will make this urgent with an MRI to further evaluate.  Patient's pain is uncontrolled with Toradol, naltrexone, anti-inflammatories and Tylenol.  Toradol injection given today and given a refill of the naproxen to use sparingly.  Follow-up with me again after imaging to discuss further treatment options.

## 2022-07-13 NOTE — Patient Instructions (Addendum)
Injection today  ° °[facility] [phone] °Call Today ° °When we receive your results we will contact you. ° °

## 2022-07-14 ENCOUNTER — Encounter: Payer: Self-pay | Admitting: Family Medicine

## 2022-07-17 ENCOUNTER — Ambulatory Visit
Admission: RE | Admit: 2022-07-17 | Discharge: 2022-07-17 | Disposition: A | Discharge status: Home / Self Care | Payer: Medicare Other | Source: Ambulatory Visit | Attending: Family Medicine | Admitting: Family Medicine

## 2022-07-17 DIAGNOSIS — M545 Low back pain, unspecified: Secondary | ICD-10-CM | POA: Diagnosis not present

## 2022-07-17 DIAGNOSIS — M48061 Spinal stenosis, lumbar region without neurogenic claudication: Secondary | ICD-10-CM | POA: Diagnosis not present

## 2022-07-17 DIAGNOSIS — M5416 Radiculopathy, lumbar region: Secondary | ICD-10-CM

## 2022-07-26 ENCOUNTER — Other Ambulatory Visit: Payer: Self-pay

## 2022-07-26 ENCOUNTER — Other Ambulatory Visit: Payer: Self-pay | Admitting: Internal Medicine

## 2022-07-26 DIAGNOSIS — M5010 Cervical disc disorder with radiculopathy, unspecified cervical region: Secondary | ICD-10-CM

## 2022-07-26 DIAGNOSIS — G8929 Other chronic pain: Secondary | ICD-10-CM

## 2022-07-26 MED ORDER — METHYLPREDNISOLONE 4 MG PO TBPK: ORAL_TABLET | ORAL | 0 refills | Status: DC

## 2022-08-02 ENCOUNTER — Ambulatory Visit: Payer: Medicare Other | Admitting: Physical Medicine and Rehabilitation

## 2022-08-07 DIAGNOSIS — K080 Exfoliation of teeth due to systemic causes: Secondary | ICD-10-CM | POA: Diagnosis not present

## 2022-08-07 NOTE — Telephone Encounter (Signed)
Patient called to follow up on the message sent below. 

## 2022-08-08 ENCOUNTER — Other Ambulatory Visit: Payer: Self-pay

## 2022-08-08 DIAGNOSIS — M5412 Radiculopathy, cervical region: Secondary | ICD-10-CM

## 2022-08-15 ENCOUNTER — Ambulatory Visit
Admission: RE | Admit: 2022-08-15 | Discharge: 2022-08-15 | Disposition: A | Discharge status: Home / Self Care | Payer: Medicare Other | Source: Ambulatory Visit | Attending: Family Medicine | Admitting: Family Medicine

## 2022-08-15 DIAGNOSIS — M4722 Other spondylosis with radiculopathy, cervical region: Secondary | ICD-10-CM | POA: Diagnosis not present

## 2022-08-15 DIAGNOSIS — M25511 Pain in right shoulder: Secondary | ICD-10-CM | POA: Diagnosis not present

## 2022-08-15 DIAGNOSIS — M5412 Radiculopathy, cervical region: Secondary | ICD-10-CM

## 2022-08-15 DIAGNOSIS — R200 Anesthesia of skin: Secondary | ICD-10-CM | POA: Diagnosis not present

## 2022-08-15 DIAGNOSIS — M542 Cervicalgia: Secondary | ICD-10-CM | POA: Diagnosis not present

## 2022-08-15 MED ORDER — IOPAMIDOL (ISOVUE-M 300) INJECTION 61%
1.0000 mL | Freq: Once | INTRAMUSCULAR | Status: AC | PRN
  Administered 2022-08-15: 1 mL via EPIDURAL

## 2022-08-15 MED ORDER — TRIAMCINOLONE ACETONIDE 40 MG/ML IJ SUSP (RADIOLOGY)
60.0000 mg | Freq: Once | INTRAMUSCULAR | Status: AC
  Administered 2022-08-15: 60 mg via EPIDURAL

## 2022-08-16 NOTE — Progress Notes (Signed)
[facility] [address] [place] [address] Phone: [phone] Subjective:   John [name], am serving as a scribe for Dr. Antoine Primas.  I'm seeing this patient by the request  of:  [name], MD  CC: Neck and back and right shoulder pain  [id number]  John [name] is a 67 y.o. male coming in with complaint of neck, back and R shoulder pain. Saw Dr. Jean Rosenthal for [facility] joint injection 07/03/2022. Epidural 08/15/2022. Injection has not taken away numbness yet but has taken away some of his pain.   Patient states that shoulder is feeling better since making the appointment. Unable to IR w/o pain. Pain in anterior aspect and sometimes in posterior aspect.      Past Medical History:  Diagnosis Date   Afib (HCC)    Anxiety and depression    Asthma    Emphysema of lung (HCC)    GERD (gastroesophageal reflux disease)    Hernia    right inguinal   History of bronchitis    Hyperlipidemia    Hypertension    Morton's neuroma    OA (osteoarthritis)    neck   Obstructive sleep apnea on CPAP    Pancreatitis    Torn meniscus    Left   Past Surgical History:  Procedure Laterality Date   BONE CYST EXCISION  1991   left wrist   COLONOSCOPY     HERNIA REPAIR  04/14/11   right inguinal   HERNIA REPAIR     left inguinal, december 2020   Social History   Socioeconomic History   Marital status: Divorced    Spouse name: Not on file   Number of children: Not on file   Years of education: Not on file   Highest education level: Not on file  Occupational History   Not on file  Tobacco Use   Smoking status: Former    Packs/day: 1.50    Years: 38.00    Additional pack years: 0.00    Total pack years: 57.00    Types: Cigarettes    Quit date: 2015    Years since quitting: 9.3   Smokeless tobacco: Never  Vaping Use   Vaping Use: Every day   Substances: Nicotine  Substance and Sexual Activity   Alcohol use: Not Currently   Drug use: No    Sexual activity: Yes    Partners: Female  Other Topics Concern   Not on file  Social History Narrative   Not on file   Social Determinants of Health   Financial Resource Strain: Not on file  Food Insecurity: Not on file  Transportation Needs: Not on file  Physical Activity: Not on file  Stress: Not on file  Social Connections: Not on file   Allergies  Allergen Reactions   Aprindine Other (See Comments)   Gabapentin Palpitations    And severe anxiety and Nausea/vomiting   Family History  Problem Relation Age of Onset   Hypertension Mother    Dementia Other    Hypertension Sister    Hypertension Brother     Current Outpatient Medications (Endocrine & Metabolic):    methylPREDNISolone (MEDROL DOSEPAK) 4 MG TBPK tablet, 4 pills day 1, 3 pills day 2-3, 2 pills day 4-7, 1 pill day 8-10, 1/2 pill thereafter until gone   testosterone (ANDROGEL) 50 MG/5GM (1%) GEL, Place 10 g onto the skin daily.  Current Outpatient Medications (Cardiovascular):    carvedilol (COREG) 3.125 MG  tablet, TAKE 1 TABLET(3.125 MG) BY MOUTH TWICE DAILY WITH A MEAL   diltiazem (CARDIZEM CD) 120 MG 24 hr capsule, TAKE 1 CAPSULE(120 MG) BY MOUTH DAILY (Patient taking differently: Take 120 mg by mouth every evening.)   diltiazem (CARDIZEM) 30 MG tablet, TAKE 1 TABLET BY MOUTH EVERY 4 HOURS AS NEEDED HR ABOVE 100 AND TOP NUMBER B/P GREATER THAN 100   olmesartan (BENICAR) 40 MG tablet, TAKE 1 TABLET(40 MG) BY MOUTH DAILY   rosuvastatin (CRESTOR) 20 MG tablet, TAKE 1 TABLET(20 MG) BY MOUTH DAILY   sildenafil (VIAGRA) 100 MG tablet, Take 1 tablet (100 mg total) by mouth daily as needed for erectile dysfunction.  Current Outpatient Medications (Respiratory):    BREO ELLIPTA 100-25 MCG/ACT AEPB, Inhale 1 puff into the lungs daily.   fluticasone (FLONASE) 50 MCG/ACT nasal spray, Place 2 sprays into both nostrils in the morning and at bedtime.   montelukast (SINGULAIR) 10 MG tablet, TAKE 1 TABLET(10 MG) BY MOUTH  DAILY  Current Outpatient Medications (Analgesics):    naproxen (NAPROSYN) 500 MG tablet, Take 1 tablet (500 mg total) by mouth 2 (two) times daily as needed.   traMADol (ULTRAM) 50 MG tablet, TAKE 1 TABLET BY MOUTH TWICE A DAY  Current Outpatient Medications (Hematological):    vitamin B-12 (CYANOCOBALAMIN) 500 MCG tablet, Take 500 mcg by mouth in the morning.  Current Outpatient Medications (Other):    CHELATED MAGNESIUM PO, Take 200 mg by mouth in the morning.   cholecalciferol (VITAMIN D3) 25 MCG (1000 UT) tablet, Take 1,000 Units by mouth every evening.   Coenzyme Q10 (CO Q 10 PO), Take 400 mg by mouth in the morning.   esomeprazole (NEXIUM) 20 MG capsule, Take 1 capsule (20 mg total) by mouth daily at 12 noon. (Patient taking differently: Take 20 mg by mouth every evening.)   Flaxseed, Linseed, (FLAX SEED OIL PO), Take 1 tablet by mouth every morning.   Multiple Vitamins-Minerals (MULTIVITAMIN WITH MINERALS) tablet, Take 1 tablet by mouth in the morning.   naltrexone (DEPADE) 50 MG tablet, Take 0.5 tablets (25 mg total) by mouth at bedtime.   Omega-3 Fatty Acids (FISH OIL) 1200 MG CAPS, Take 1 capsule by mouth in the morning and at bedtime.   tiZANidine (ZANAFLEX) 4 MG tablet, Take 1 tablet (4 mg total) by mouth every 8 (eight) hours as needed for muscle spasms.   TURMERIC PO, Take 800 mg by mouth 2 (two) times daily.   Reviewed prior external information including notes and imaging from  primary care provider As well as notes that were available from care everywhere and other healthcare systems.  Past medical history, social, surgical and family history all reviewed in electronic medical record.  No pertanent information unless stated regarding to the chief complaint.   Review of Systems:  No headache, visual changes, nausea, vomiting, diarrhea, constipation, dizziness, abdominal pain, skin rash, fevers, chills, night sweats, weight loss, swollen lymph nodes, body aches, joint  swelling, chest pain, shortness of breath, mood changes. POSITIVE muscle aches  Objective  Blood pressure 118/88, pulse 87, height 5\' 11"  (1.803 m), weight 209 lb (94.8 kg), SpO2 93 %.   General: No apparent distress alert and oriented x3 mood and affect normal, dressed appropriately.  HEENT: Pupils equal, extraocular movements intact  Respiratory: Patient's speak in full sentences and does not appear short of breath  Cardiovascular: No lower extremity edema, non tender, no erythema  Neck exam does have limited range of motion noted.  Still in  5 degrees of extension without some radicular symptoms.  Fullness noted of the neck as well.  Patient does have positive impingement of the shoulder noted especially with internal range of motion and only to the lateral hip.  Positive speeds and Yergason's  Procedure: Real-time Ultrasound Guided Injection of right bicep tendon sheath joint Device: GE Logiq Q7  Ultrasound guided injection is preferred based studies that show increased duration, increased effect, greater accuracy, decreased procedural pain, increased response rate with ultrasound guided versus blind injection.  Verbal informed consent obtained.  Time-out conducted.  Noted no overlying erythema, induration, or other signs of local infection.  Skin prepped in a sterile fashion.  Local anesthesia: Topical Ethyl chloride.  With sterile technique and under real time ultrasound guidance:  Joint visualized.  With a 21-gauge 2 inch needle injected with 0.5 cc of 0.5% Marcaine and 0.5 cc of Kenalog 40 mg/mL Completed without difficulty  Pain immediately resolved suggesting accurate placement of the medication.  Advised to call if fevers/chills, erythema, induration, drainage, or persistent bleeding.  Impression: Technically successful ultrasound guided injection.    Impression and Recommendations:    The above documentation has been reviewed and is accurate and complete Judi Saa,  DO

## 2022-08-22 ENCOUNTER — Encounter: Payer: Self-pay | Admitting: Family Medicine

## 2022-08-22 ENCOUNTER — Other Ambulatory Visit: Payer: Self-pay

## 2022-08-22 ENCOUNTER — Other Ambulatory Visit: Payer: Self-pay | Admitting: Internal Medicine

## 2022-08-22 ENCOUNTER — Ambulatory Visit: Payer: Medicare Other | Admitting: Family Medicine

## 2022-08-22 VITALS — BP 118/88 | HR 87 | Ht 71.0 in | Wt 209.0 lb

## 2022-08-22 DIAGNOSIS — M25511 Pain in right shoulder: Secondary | ICD-10-CM | POA: Diagnosis not present

## 2022-08-22 DIAGNOSIS — I10 Essential (primary) hypertension: Secondary | ICD-10-CM

## 2022-08-22 DIAGNOSIS — S46111A Strain of muscle, fascia and tendon of long head of biceps, right arm, initial encounter: Secondary | ICD-10-CM

## 2022-08-22 DIAGNOSIS — I480 Paroxysmal atrial fibrillation: Secondary | ICD-10-CM

## 2022-08-22 DIAGNOSIS — M5010 Cervical disc disorder with radiculopathy, unspecified cervical region: Secondary | ICD-10-CM

## 2022-08-22 DIAGNOSIS — K219 Gastro-esophageal reflux disease without esophagitis: Secondary | ICD-10-CM

## 2022-08-22 NOTE — Patient Instructions (Signed)
Injected shoulder Bicep exercises See me again in 6-8 weeks

## 2022-08-22 NOTE — Assessment & Plan Note (Signed)
Patient will be following up with neurosurgery and likely having surgery at the end of summer or early fall.  Patient needs to continue to work he states.  Hopefully the injection will help and knows that the possibility for any type of tendon injury can be done with this.  Will monitor closely.

## 2022-08-22 NOTE — Assessment & Plan Note (Signed)
Patient given injection.  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  Discussed icing regimen and home exercises.  Discussed which activities to do and which ones to avoid.  Increase activity slowly.  Arm compression sleeve also discussed.  Follow-up again in 6 to 8 weeks

## 2022-08-28 DIAGNOSIS — K080 Exfoliation of teeth due to systemic causes: Secondary | ICD-10-CM | POA: Diagnosis not present

## 2022-08-30 ENCOUNTER — Other Ambulatory Visit: Payer: Self-pay | Admitting: Internal Medicine

## 2022-08-30 DIAGNOSIS — I10 Essential (primary) hypertension: Secondary | ICD-10-CM

## 2022-08-30 DIAGNOSIS — I480 Paroxysmal atrial fibrillation: Secondary | ICD-10-CM

## 2022-09-02 ENCOUNTER — Encounter: Payer: Self-pay | Admitting: Internal Medicine

## 2022-09-06 ENCOUNTER — Other Ambulatory Visit: Payer: Self-pay | Admitting: Internal Medicine

## 2022-09-06 ENCOUNTER — Other Ambulatory Visit: Payer: Self-pay | Admitting: Family Medicine

## 2022-09-06 DIAGNOSIS — M5010 Cervical disc disorder with radiculopathy, unspecified cervical region: Secondary | ICD-10-CM

## 2022-09-06 DIAGNOSIS — G8929 Other chronic pain: Secondary | ICD-10-CM

## 2022-09-06 DIAGNOSIS — J309 Allergic rhinitis, unspecified: Secondary | ICD-10-CM

## 2022-09-06 MED ORDER — TRAMADOL HCL 50 MG PO TABS
50.0000 mg | ORAL_TABLET | Freq: Two times a day (BID) | ORAL | 1 refills | Status: DC | PRN
Start: 2022-09-06 — End: 2022-12-20

## 2022-09-06 NOTE — Telephone Encounter (Signed)
Patient called and said that he needs this script filled. Please Call:  [phone]

## 2022-09-11 ENCOUNTER — Other Ambulatory Visit: Payer: Self-pay | Admitting: Internal Medicine

## 2022-09-11 ENCOUNTER — Telehealth: Payer: Self-pay | Admitting: Internal Medicine

## 2022-09-11 DIAGNOSIS — J309 Allergic rhinitis, unspecified: Secondary | ICD-10-CM

## 2022-09-11 NOTE — Telephone Encounter (Signed)
Prescription Request  09/11/2022  LOV: 05/22/2022  What is the name of the medication or equipment? montelukast (SINGULAIR) 10 MG tablet   Have you contacted your pharmacy to request a refill? No   Which pharmacy would you like this sent to?      [id number] - [place], [place] - [address] AT [address] OF [facility] [address] [place] [address] Phone: [phone] Fax: [phone]  Patient notified that their request is being sent to the clinical staff for review and that they should receive a response within 2 business days.   Please advise at Mobile [phone] (mobile)

## 2022-09-18 ENCOUNTER — Encounter: Payer: Self-pay | Admitting: Family Medicine

## 2022-09-19 ENCOUNTER — Other Ambulatory Visit: Payer: Self-pay

## 2022-09-19 MED ORDER — METHYLPREDNISOLONE 4 MG PO TBPK: ORAL_TABLET | ORAL | 0 refills | Status: DC

## 2022-09-25 ENCOUNTER — Encounter: Payer: Self-pay | Admitting: Internal Medicine

## 2022-09-25 ENCOUNTER — Ambulatory Visit: Payer: Medicare Other | Admitting: Physical Medicine and Rehabilitation

## 2022-09-25 ENCOUNTER — Other Ambulatory Visit: Payer: Self-pay | Admitting: Internal Medicine

## 2022-09-25 ENCOUNTER — Telehealth: Payer: Self-pay | Admitting: Pulmonary Disease

## 2022-09-25 DIAGNOSIS — M4712 Other spondylosis with myelopathy, cervical region: Secondary | ICD-10-CM | POA: Diagnosis not present

## 2022-09-25 DIAGNOSIS — K219 Gastro-esophageal reflux disease without esophagitis: Secondary | ICD-10-CM

## 2022-09-25 DIAGNOSIS — E785 Hyperlipidemia, unspecified: Secondary | ICD-10-CM

## 2022-09-25 DIAGNOSIS — Z6829 Body mass index (BMI) 29.0-29.9, adult: Secondary | ICD-10-CM | POA: Diagnosis not present

## 2022-09-25 DIAGNOSIS — I480 Paroxysmal atrial fibrillation: Secondary | ICD-10-CM

## 2022-09-25 DIAGNOSIS — J309 Allergic rhinitis, unspecified: Secondary | ICD-10-CM

## 2022-09-25 DIAGNOSIS — I10 Essential (primary) hypertension: Secondary | ICD-10-CM

## 2022-09-25 MED ORDER — CARVEDILOL 3.125 MG PO TABS: ORAL_TABLET | ORAL | 0 refills | Status: DC

## 2022-09-25 MED ORDER — DILTIAZEM HCL ER COATED BEADS 120 MG PO CP24: ORAL_CAPSULE | ORAL | 0 refills | Status: DC

## 2022-09-25 MED ORDER — OLMESARTAN MEDOXOMIL 40 MG PO TABS: 40.0000 mg | ORAL_TABLET | Freq: Every day | ORAL | 0 refills | Status: DC

## 2022-09-25 MED ORDER — ROSUVASTATIN CALCIUM 20 MG PO TABS
ORAL_TABLET | ORAL | 0 refills | Status: DC
Start: 2022-09-25 — End: 2023-02-18

## 2022-09-25 MED ORDER — FLUTICASONE PROPIONATE 50 MCG/ACT NA SUSP
2.0000 | Freq: Two times a day (BID) | NASAL | 0 refills | Status: AC
Start: 2022-09-25 — End: ?

## 2022-09-25 MED ORDER — MONTELUKAST SODIUM 10 MG PO TABS
10.0000 mg | ORAL_TABLET | Freq: Every day | ORAL | 0 refills | Status: DC
Start: 2022-09-25 — End: 2022-12-31

## 2022-09-25 MED ORDER — ESOMEPRAZOLE MAGNESIUM 20 MG PO CPDR
20.0000 mg | DELAYED_RELEASE_CAPSULE | Freq: Every day | ORAL | 0 refills | Status: DC
Start: 2022-09-25 — End: 2022-12-31

## 2022-09-25 NOTE — Telephone Encounter (Signed)
[facility] [address] 100-25 MCG/ACT AEPB

## 2022-09-27 ENCOUNTER — Other Ambulatory Visit: Payer: Self-pay

## 2022-09-27 MED ORDER — BREO ELLIPTA 100-25 MCG/ACT IN AEPB: 1.0000 | INHALATION_SPRAY | Freq: Every day | RESPIRATORY_TRACT | 5 refills | Status: DC

## 2022-09-27 NOTE — Telephone Encounter (Signed)
ATC X1 LVM for patient. Please advise Breo inhaler has been sent to pharmacy 

## 2022-09-27 NOTE — Telephone Encounter (Signed)
ATC X1 LVM for patient. Please advise Breo inhaler has been sent to pharmacy

## 2022-10-05 ENCOUNTER — Other Ambulatory Visit: Payer: Self-pay

## 2022-10-05 DIAGNOSIS — M5412 Radiculopathy, cervical region: Secondary | ICD-10-CM

## 2022-10-08 ENCOUNTER — Encounter: Payer: Self-pay | Admitting: Pulmonary Disease

## 2022-10-09 ENCOUNTER — Other Ambulatory Visit: Payer: Self-pay

## 2022-10-09 ENCOUNTER — Other Ambulatory Visit (HOSPITAL_COMMUNITY): Payer: Self-pay

## 2022-10-09 ENCOUNTER — Ambulatory Visit
Admission: RE | Admit: 2022-10-09 | Discharge: 2022-10-09 | Disposition: A | Discharge status: Home / Self Care | Payer: Medicare Other | Source: Ambulatory Visit | Attending: Pulmonary Disease | Admitting: Pulmonary Disease

## 2022-10-09 DIAGNOSIS — I700 Atherosclerosis of aorta: Secondary | ICD-10-CM | POA: Diagnosis not present

## 2022-10-09 DIAGNOSIS — I7123 Aneurysm of the descending thoracic aorta, without rupture: Secondary | ICD-10-CM | POA: Diagnosis not present

## 2022-10-09 DIAGNOSIS — R918 Other nonspecific abnormal finding of lung field: Secondary | ICD-10-CM

## 2022-10-09 DIAGNOSIS — J439 Emphysema, unspecified: Secondary | ICD-10-CM | POA: Diagnosis not present

## 2022-10-09 MED ORDER — METHYLPREDNISOLONE 4 MG PO TBPK: ORAL_TABLET | ORAL | 0 refills | Status: DC

## 2022-10-09 MED ORDER — METHYLPREDNISOLONE 4 MG PO TBPK
ORAL_TABLET | ORAL | 0 refills | Status: DC
  Filled 2022-10-09: qty 21, fill #0

## 2022-10-10 ENCOUNTER — Other Ambulatory Visit: Payer: Self-pay | Admitting: Internal Medicine

## 2022-10-10 DIAGNOSIS — I10 Essential (primary) hypertension: Secondary | ICD-10-CM

## 2022-10-10 NOTE — Telephone Encounter (Signed)
Dr Tonia Brooms- please see email from the pt   John [name] "[name]"  to [facility] (supporting Josephine Igo, DO)      10/08/22  3:32 PM Dr Tonia Brooms I am having the lung scan Monday 11 AM. This is following up b/c they found a small nodule they called an "aspiration" back in October when they did a calcium heart scan. I don't know what protocols are but my request to you is that if I show something positive to please not just put it on the patient portal or have someone call me.  I have an appt with you to follow up & do my yearly check-up on Wed July 17th. I'd rather hear the results then unless the scan is good (no cancer). I hope this doesn't sound wimpy but I've been thru alot this past 8 months and am not sure how I'd react if told I had cancer. Thank you doctor for your consideration.   [name]

## 2022-10-16 ENCOUNTER — Telehealth: Payer: Self-pay | Admitting: Pulmonary Disease

## 2022-10-16 ENCOUNTER — Ambulatory Visit
Admission: RE | Admit: 2022-10-16 | Discharge: 2022-10-16 | Disposition: A | Discharge status: Home / Self Care | Payer: Medicare Other | Source: Ambulatory Visit | Attending: Family Medicine | Admitting: Family Medicine

## 2022-10-16 DIAGNOSIS — M5412 Radiculopathy, cervical region: Secondary | ICD-10-CM | POA: Diagnosis not present

## 2022-10-16 DIAGNOSIS — Z8679 Personal history of other diseases of the circulatory system: Secondary | ICD-10-CM

## 2022-10-16 DIAGNOSIS — M4722 Other spondylosis with radiculopathy, cervical region: Secondary | ICD-10-CM | POA: Diagnosis not present

## 2022-10-16 MED ORDER — IOPAMIDOL (ISOVUE-M 300) INJECTION 61%
1.0000 mL | Freq: Once | INTRAMUSCULAR | Status: AC | PRN
  Administered 2022-10-16: 1 mL via EPIDURAL

## 2022-10-16 MED ORDER — TRIAMCINOLONE ACETONIDE 40 MG/ML IJ SUSP (RADIOLOGY)
60.0000 mg | Freq: Once | INTRAMUSCULAR | Status: AC
  Administered 2022-10-16: 60 mg via EPIDURAL

## 2022-10-16 NOTE — Telephone Encounter (Signed)
Dr Tonia Brooms, Received call report.  Here are the results:   CLINICAL DATA:  lung nodules Nodule follow up. Pt has copd. Former smoker. Currently vapes.   EXAM: CT CHEST WITHOUT CONTRAST   TECHNIQUE: Multidetector CT imaging of the chest was performed following the standard protocol without IV contrast.   RADIATION DOSE REDUCTION: This exam was performed according to the departmental dose-optimization program which includes automated exposure control, adjustment of the mA and/or kV according to patient size and/or use of iterative reconstruction technique.   COMPARISON:  CT heart 05/01/2022   FINDINGS: Cardiovascular: Normal heart size. No significant pericardial effusion. The ascending thoracic aorta is normal in caliber. Aneurysmal dilatation of the proximal descending thoracic aorta with caliber measuring up to 3.8 cm. At least mild atherosclerotic plaque of the thoracic aorta. Left anterior descending coronary artery calcifications. The main pulmonary artery is normal in caliber.   Mediastinum/Nodes: No enlarged mediastinal or axillary lymph nodes. Thyroid gland, trachea, and esophagus demonstrate no significant findings.   Lungs/Pleura: Trace biapical pleural/pulmonary scarring. Mild to moderate centrilobular emphysematous changes. No focal consolidation. No pulmonary nodule. No pulmonary mass. No pleural effusion. No pneumothorax.   Upper Abdomen: Punctate left nephrolithiasis. Punctate calcifications of the spleen likely sequelae of prior granulomatous disease.   Musculoskeletal:   No chest wall abnormality. Indeterminate slight asymmetry of the left serrated anterior musculature (2:70-106).   No suspicious lytic or blastic osseous lesions. No acute displaced fracture.   IMPRESSION: 1. 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 Limited evaluation with underlying dissection not fully excluded on this noncontrast study. Recommend CTA of the chest for  further evaluation. 2. No pulmonary nodules. 3. Nonobstructive punctate left nephrolithiasis. 4. Aortic Atherosclerosis (ICD10-I70.0) and Emphysema (ICD10-J43.9). 5. Indeterminate slight asymmetry of the left serrated anterior musculature. Correlate with physical exam.   These results will be called to the ordering clinician or representative by the Radiologist Assistant, and communication documented in the PACS or [employer].     Electronically Signed   By: Tish Frederickson M.D.   On: 10/13/2022 21:03

## 2022-10-16 NOTE — Telephone Encounter (Signed)
Called patient, he did not answer. Left message for him to call us back.

## 2022-10-16 NOTE — Telephone Encounter (Signed)
Tiffany calling from [facility] @ [phone]. Please call for call report.

## 2022-10-16 NOTE — Discharge Instructions (Signed)

## 2022-10-17 NOTE — Telephone Encounter (Signed)
Pt returning missed call. 

## 2022-10-17 NOTE — Telephone Encounter (Signed)
Called and spoke with patient. He verbalized understanding and wishes to proceed with the CTA Angio. Order has been placed. He is aware that he will get a call to get this scheduled.   Nothing further needed at time of call.

## 2022-10-20 ENCOUNTER — Ambulatory Visit (HOSPITAL_COMMUNITY)
Admission: RE | Admit: 2022-10-20 | Discharge: 2022-10-20 | Disposition: A | Discharge status: Home / Self Care | Payer: Medicare Other | Source: Ambulatory Visit | Attending: Pulmonary Disease | Admitting: Pulmonary Disease

## 2022-10-20 DIAGNOSIS — Z8679 Personal history of other diseases of the circulatory system: Secondary | ICD-10-CM | POA: Insufficient documentation

## 2022-10-20 DIAGNOSIS — R079 Chest pain, unspecified: Secondary | ICD-10-CM | POA: Diagnosis not present

## 2022-10-20 DIAGNOSIS — I7123 Aneurysm of the descending thoracic aorta, without rupture: Secondary | ICD-10-CM | POA: Diagnosis not present

## 2022-10-20 DIAGNOSIS — I700 Atherosclerosis of aorta: Secondary | ICD-10-CM | POA: Diagnosis not present

## 2022-10-20 MED ORDER — IOHEXOL 350 MG/ML SOLN
75.0000 mL | Freq: Once | INTRAVENOUS | Status: AC | PRN
  Administered 2022-10-20: 75 mL via INTRAVENOUS

## 2022-10-26 ENCOUNTER — Encounter (HOSPITAL_BASED_OUTPATIENT_CLINIC_OR_DEPARTMENT_OTHER): Payer: Self-pay

## 2022-10-26 ENCOUNTER — Ambulatory Visit: Payer: Medicare Other | Admitting: Pulmonary Disease

## 2022-10-26 ENCOUNTER — Encounter: Payer: Self-pay | Admitting: Pulmonary Disease

## 2022-10-26 VITALS — BP 140/90 | HR 89 | Ht 71.0 in | Wt 209.0 lb

## 2022-10-26 DIAGNOSIS — J449 Chronic obstructive pulmonary disease, unspecified: Secondary | ICD-10-CM | POA: Diagnosis not present

## 2022-10-26 DIAGNOSIS — Z87891 Personal history of nicotine dependence: Secondary | ICD-10-CM

## 2022-10-26 DIAGNOSIS — J4489 Other specified chronic obstructive pulmonary disease: Secondary | ICD-10-CM

## 2022-10-26 DIAGNOSIS — Z720 Tobacco use: Secondary | ICD-10-CM

## 2022-10-26 NOTE — Progress Notes (Signed)
Synopsis: Referred in May 2021 self-referral for COPD, PCP: By [name], MD  Subjective:   PATIENT ID: John [name] GENDER: male DOB: 1956/03/18, MRN: [id number]  Chief Complaint  Patient presents with   Follow-up    F/up on LCS CT scan     This is a 67 year old gentleman past medical history of emphysema, asthma, atrial fibrillation, hypertension hyperlipidemia presents to pulmonary clinic for evaluation of COPD.  Patient is a former smoker.  He has not had any prior PFTs.  Previous echocardiogram in 2018 with normal ejection fraction 55 to 60%, grade 1 diastolic dysfunction.  OV 08/18/2019: back in march had URI, lingering cough for 6 weeks. Former smoker 38 years, quit 2015, replaced with vaping. He is still vaping at this point.  Overall he is improved since his exacerbation in March.  He has had previous exacerbations treated with steroids.  He does have previous events in which steroids were given to him he developed atrial fibrillation following this.  He is currently on Multaq.  He is not on anticoagulation.  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  He uses this multiple times a day.  When asked about how much he uses he was unwilling to disclose the exact amount of Afrin nasal spray he uses.  Does complain of fluid and pressure buildup in his ear.  OV 10/04/2020: Patient was referred for lung cancer screening last office visit.  He met with him and declined.  Seems to be very anxious about having or undergoing a screening program.  He is still vaping however.  He vapes several cartridges a day.  Been unable to quit at this time.  He is also not interested in quitting.  He did quit smoking tobacco cigarettes in 2015.  He was able to stop the use of Afrin.  His nasal symptoms and ear issues have resolved.  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  Former smoker.  At last office visit was using Trelegy.  Difficulty  obtaining this from pharmacy.  He has had multiple issues with insurance company on medications that are on his formulary.  Currently on Breo Ellipta.  Overall doing a little bit better.  Using [facility] but he still has occasional wheezing and breakthrough symptoms on Breo.  He did not have any of this when on Trelegy.  He benefited from being on triple therapy.  He is rather frustrated that he has not been able to obtain formulary information from his insurance provider.  OV 10/26/2022: Here today for follow-up of asthma COPD overlap, former smoker.  Currently using Trelegy but has needed some movement with helps for cost.  He did was not qualified for [employer] but his insurance has changed for potential financial aid assistance.  We can try to work with him to help reapply.  He may qualify now.  If not we could always consider swapping his medications.  Reviewed his recent CT imaging.  Nodules are disappeared.  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      Past Medical History:  Diagnosis Date   Afib (HCC)    Anxiety and depression    Asthma    Emphysema of lung (HCC)    GERD (gastroesophageal reflux disease)    Hernia    right inguinal   History of bronchitis    Hyperlipidemia    Hypertension    Morton's neuroma  OA (osteoarthritis)    neck   Obstructive sleep apnea on CPAP    Pancreatitis    Torn meniscus    Left     Family History  Problem Relation Age of Onset   Hypertension Mother    Dementia Other    Hypertension Sister    Hypertension Brother      Past Surgical History:  Procedure Laterality Date   BONE CYST EXCISION  1991   left wrist   COLONOSCOPY     HERNIA REPAIR  04/14/11   right inguinal   HERNIA REPAIR     left inguinal, december 2020    Social History   Socioeconomic History   Marital status: Divorced    Spouse name: Not on file   Number of children: Not on file   Years of education: Not on file   Highest education level: Not on  file  Occupational History   Not on file  Tobacco Use   Smoking status: Former    Current packs/day: 0.00    Average packs/day: 1.5 packs/day for 38.0 years (57.0 ttl pk-yrs)    Types: Cigarettes    Start date: 61    Quit date: 2015    Years since quitting: 9.5   Smokeless tobacco: Never  Vaping Use   Vaping status: Every Day   Substances: Nicotine  Substance and Sexual Activity   Alcohol use: Not Currently   Drug use: No   Sexual activity: Yes    Partners: Female  Other Topics Concern   Not on file  Social History Narrative   Not on file   Social Determinants of Health   Financial Resource Strain: Not on file  Food Insecurity: Not on file  Transportation Needs: Not on file  Physical Activity: Not on file  Stress: Not on file  Social Connections: Not on file  Intimate Partner Violence: Not on file     Allergies  Allergen Reactions   Aprindine Other (See Comments)   Gabapentin Palpitations    And severe anxiety and Nausea/vomiting     Outpatient Medications Prior to Visit  Medication Sig Dispense Refill   BREO ELLIPTA 100-25 MCG/ACT AEPB Inhale 1 puff into the lungs daily. 60 each 5   carvedilol (COREG) 3.125 MG tablet TAKE 1 TABLET(3.125 MG) BY MOUTH TWICE DAILY WITH A MEAL 180 tablet 0   CHELATED MAGNESIUM PO Take 200 mg by mouth in the morning.     cholecalciferol (VITAMIN D3) 25 MCG (1000 UT) tablet Take 1,000 Units by mouth every evening.     Coenzyme Q10 (CO Q 10 PO) Take 400 mg by mouth in the morning.     diltiazem (CARDIZEM CD) 120 MG 24 hr capsule TAKE 1 CAPSULE(120 MG) BY MOUTH DAILY 90 capsule 0   diltiazem (CARDIZEM) 30 MG tablet TAKE 1 TABLET BY MOUTH EVERY 4 HOURS AS NEEDED HR ABOVE 100 AND TOP NUMBER B/P GREATER THAN 100 30 tablet 1   esomeprazole (NEXIUM) 20 MG capsule Take 1 capsule (20 mg total) by mouth daily at 12 noon. 90 capsule 0   Flaxseed, Linseed, (FLAX SEED OIL PO) Take 1 tablet by mouth every morning.     fluticasone (FLONASE) 50  MCG/ACT nasal spray Place 2 sprays into both nostrils in the morning and at bedtime. 48 g 0   methylPREDNISolone (MEDROL DOSEPAK) 4 MG TBPK tablet 4 pills day 1, 3 pills day 2-3, 2 pills day 4-7, 1 pill day 8-10, 1/2 pill thereafter until gone 21 tablet  0   montelukast (SINGULAIR) 10 MG tablet Take 1 tablet (10 mg total) by mouth daily. 90 tablet 0   Multiple Vitamins-Minerals (MULTIVITAMIN WITH MINERALS) tablet Take 1 tablet by mouth in the morning.     naproxen (NAPROSYN) 500 MG tablet Take 1 tablet (500 mg total) by mouth 2 (two) times daily as needed. 60 tablet 1   olmesartan (BENICAR) 40 MG tablet Take 1 tablet (40 mg total) by mouth daily. 90 tablet 0   Omega-3 Fatty Acids (FISH OIL) 1200 MG CAPS Take 1 capsule by mouth in the morning and at bedtime.     rosuvastatin (CRESTOR) 20 MG tablet TAKE 1 TABLET(20 MG) BY MOUTH DAILY 90 tablet 0   sildenafil (VIAGRA) 100 MG tablet Take 1 tablet (100 mg total) by mouth daily as needed for erectile dysfunction. 10 tablet 4   testosterone (ANDROGEL) 50 MG/5GM (1%) GEL Place 10 g onto the skin daily. 900 g 1   tiZANidine (ZANAFLEX) 4 MG tablet Take 1 tablet (4 mg total) by mouth every 8 (eight) hours as needed for muscle spasms. 270 tablet 0   traMADol (ULTRAM) 50 MG tablet Take 1 tablet (50 mg total) by mouth every 12 (twelve) hours as needed. 60 tablet 1   vitamin B-12 (CYANOCOBALAMIN) 500 MCG tablet Take 500 mcg by mouth in the morning.     naltrexone (DEPADE) 50 MG tablet Take 0.5 tablets (25 mg total) by mouth at bedtime. (Patient not taking: Reported on 10/26/2022) 15 tablet 5   No facility-administered medications prior to visit.    Review of Systems  Constitutional:  Negative for chills, fever, malaise/fatigue and weight loss.  HENT:  Negative for hearing loss, sore throat and tinnitus.   Eyes:  Negative for blurred vision and double vision.  Respiratory:  Negative for cough, hemoptysis, sputum production, shortness of breath, wheezing and  stridor.   Cardiovascular:  Negative for chest pain, palpitations, orthopnea, leg swelling and PND.  Gastrointestinal:  Negative for abdominal pain, constipation, diarrhea, heartburn, nausea and vomiting.  Genitourinary:  Negative for dysuria, hematuria and urgency.  Musculoskeletal:  Negative for joint pain and myalgias.  Skin:  Negative for itching and rash.  Neurological:  Negative for dizziness, tingling, weakness and headaches.  Endo/Heme/Allergies:  Negative for environmental allergies. Does not bruise/bleed easily.  Psychiatric/Behavioral:  Negative for depression. The patient is nervous/anxious. The patient does not have insomnia.   All other systems reviewed and are negative.    Objective:  Physical Exam Vitals reviewed.  Constitutional:      General: He is not in acute distress.    Appearance: He is well-developed.  HENT:     Head: Normocephalic and atraumatic.  Eyes:     General: No scleral icterus.    Conjunctiva/sclera: Conjunctivae normal.     Pupils: Pupils are equal, round, and reactive to light.  Neck:     Vascular: No JVD.     Trachea: No tracheal deviation.  Cardiovascular:     Rate and Rhythm: Normal rate and regular rhythm.     Heart sounds: Normal heart sounds. No murmur heard. Pulmonary:     Effort: Pulmonary effort is normal. No tachypnea, accessory muscle usage or respiratory distress.     Breath sounds: No stridor. No wheezing, rhonchi or rales.  Abdominal:     General: Bowel sounds are normal. There is no distension.     Palpations: Abdomen is soft.     Tenderness: There is no abdominal tenderness.  Musculoskeletal:  General: No tenderness.     Cervical back: Neck supple.  Lymphadenopathy:     Cervical: No cervical adenopathy.  Skin:    General: Skin is warm and dry.     Capillary Refill: Capillary refill takes less than 2 seconds.     Findings: No rash.  Neurological:     Mental Status: He is alert and oriented to person, place, and  time.  Psychiatric:        Behavior: Behavior normal.      Vitals:   10/26/22 0835  BP: (!) 140/90  Pulse: 89  SpO2: 96%  Weight: 209 lb (94.8 kg)  Height: 5\' 11"  (1.803 m)     96% on RA BMI Readings from Last 3 Encounters:  10/26/22 29.15 kg/m  08/22/22 29.15 kg/m  07/13/22 29.29 kg/m   Wt Readings from Last 3 Encounters:  10/26/22 209 lb (94.8 kg)  08/22/22 209 lb (94.8 kg)  07/13/22 210 lb (95.3 kg)     CBC    Component Value Date/Time   WBC 6.7 05/22/2022 1144   RBC 5.25 05/22/2022 1144   HGB 16.1 05/22/2022 1144   HCT 48.5 05/22/2022 1144   PLT 165.0 05/22/2022 1144   MCV 92.3 05/22/2022 1144   MCH 29.9 05/02/2019 1432   MCHC 33.1 05/22/2022 1144   RDW 14.4 05/22/2022 1144   LYMPHSABS 1.1 05/22/2022 1144   MONOABS 0.7 05/22/2022 1144   EOSABS 0.2 05/22/2022 1144   BASOSABS 0.0 05/22/2022 1144     Chest Imaging: Chest x-ray 06/23/2019 2 view No acute abnormality, no infiltrate The patient's images have been independently reviewed by me.    Pulmonary Functions Testing Results:    Latest Ref Rng & Units 10/27/2019    9:02 AM  PFT Results  FVC-Pre L 3.44   FVC-Predicted Pre % 71   FVC-Post L 3.87   FVC-Predicted Post % 80   Pre FEV1/FVC % % 65   Post FEV1/FCV % % 65   FEV1-Pre L 2.23   FEV1-Predicted Pre % 61   FEV1-Post L 2.50   DLCO uncorrected ml/min/mmHg 27.14   DLCO UNC% % 97   DLCO corrected ml/min/mmHg 27.14   DLCO COR %Predicted % 97   DLVA Predicted % 102   TLC L 6.95   TLC % Predicted % 97   RV % Predicted % 142     FeNO: none   Pathology: none   Echocardiogram: 2018: normal EF, G1DF  Heart Catheterization: None     Assessment & Plan:      ICD-10-CM   1. Asthma-COPD overlap syndrome  J44.89     2. COPD, moderate (HCC)  J44.9     3. Chronic obstructive pulmonary disease, unspecified COPD type (HCC)  J44.9     4. Former smoker  Z87.891     5. Tobacco abuse  Z72.0        Discussion:  This is a  67 year old gentleman, former smoker, quit smoking cigarettes in 2015.  He has replaced it with vaping.  He still using Afrin as needed.  COPD management with triple therapy inhaler.  Had to switch from Trelegy to [facility].  Last time we moved back to Trelegy with application for [employer].  He has been able to get it with a co-pay but now in the donut hole having trouble affording medications.  Plan: Will have him follow-up with cardiology for management of his thoracic aneurysm. No additional follow-up needed for CT imaging regarding lung nodules. He does  potentially qualify for lung cancer screening CT but now has a thoracic aneurysm that we will need annual CT surveillance so we could likely not need a lung cancer screening CT at this time. I will reach out to his cardiologist Dr. Cristal Deer. If [employer] does not work RTC in 1 year if needed     Current Outpatient Medications:    BREO ELLIPTA 100-25 MCG/ACT AEPB, Inhale 1 puff into the lungs daily., Disp: 60 each, Rfl: 5   carvedilol (COREG) 3.125 MG tablet, TAKE 1 TABLET(3.125 MG) BY MOUTH TWICE DAILY WITH A MEAL, Disp: 180 tablet, Rfl: 0   CHELATED MAGNESIUM PO, Take 200 mg by mouth in the morning., Disp: , Rfl:    cholecalciferol (VITAMIN D3) 25 MCG (1000 UT) tablet, Take 1,000 Units by mouth every evening., Disp: , Rfl:    Coenzyme Q10 (CO Q 10 PO), Take 400 mg by mouth in the morning., Disp: , Rfl:    diltiazem (CARDIZEM CD) 120 MG 24 hr capsule, TAKE 1 CAPSULE(120 MG) BY MOUTH DAILY, Disp: 90 capsule, Rfl: 0   diltiazem (CARDIZEM) 30 MG tablet, TAKE 1 TABLET BY MOUTH EVERY 4 HOURS AS NEEDED HR ABOVE 100 AND TOP NUMBER B/P GREATER THAN 100, Disp: 30 tablet, Rfl: 1   esomeprazole (NEXIUM) 20 MG capsule, Take 1 capsule (20 mg total) by mouth daily at 12 noon., Disp: 90 capsule, Rfl: 0   Flaxseed, Linseed, (FLAX SEED OIL PO), Take 1 tablet by mouth every morning., Disp: , Rfl:    fluticasone (FLONASE) 50 MCG/ACT nasal spray, Place 2 sprays into both  nostrils in the morning and at bedtime., Disp: 48 g, Rfl: 0   methylPREDNISolone (MEDROL DOSEPAK) 4 MG TBPK tablet, 4 pills day 1, 3 pills day 2-3, 2 pills day 4-7, 1 pill day 8-10, 1/2 pill thereafter until gone, Disp: 21 tablet, Rfl: 0   montelukast (SINGULAIR) 10 MG tablet, Take 1 tablet (10 mg total) by mouth daily., Disp: 90 tablet, Rfl: 0   Multiple Vitamins-Minerals (MULTIVITAMIN WITH MINERALS) tablet, Take 1 tablet by mouth in the morning., Disp: , Rfl:    naproxen (NAPROSYN) 500 MG tablet, Take 1 tablet (500 mg total) by mouth 2 (two) times daily as needed., Disp: 60 tablet, Rfl: 1   olmesartan (BENICAR) 40 MG tablet, Take 1 tablet (40 mg total) by mouth daily., Disp: 90 tablet, Rfl: 0   Omega-3 Fatty Acids (FISH OIL) 1200 MG CAPS, Take 1 capsule by mouth in the morning and at bedtime., Disp: , Rfl:    rosuvastatin (CRESTOR) 20 MG tablet, TAKE 1 TABLET(20 MG) BY MOUTH DAILY, Disp: 90 tablet, Rfl: 0   sildenafil (VIAGRA) 100 MG tablet, Take 1 tablet (100 mg total) by mouth daily as needed for erectile dysfunction., Disp: 10 tablet, Rfl: 4   testosterone (ANDROGEL) 50 MG/5GM (1%) GEL, Place 10 g onto the skin daily., Disp: 900 g, Rfl: 1   tiZANidine (ZANAFLEX) 4 MG tablet, Take 1 tablet (4 mg total) by mouth every 8 (eight) hours as needed for muscle spasms., Disp: 270 tablet, Rfl: 0   traMADol (ULTRAM) 50 MG tablet, Take 1 tablet (50 mg total) by mouth every 12 (twelve) hours as needed., Disp: 60 tablet, Rfl: 1   vitamin B-12 (CYANOCOBALAMIN) 500 MCG tablet, Take 500 mcg by mouth in the morning., Disp: , Rfl:    naltrexone (DEPADE) 50 MG tablet, Take 0.5 tablets (25 mg total) by mouth at bedtime. (Patient not taking: Reported on 10/26/2022), Disp: 15 tablet, Rfl:  5   Josephine Igo, DO [facility] 10/26/2022 8:54 AM

## 2022-10-26 NOTE — Patient Instructions (Signed)
Thank you for visiting Dr. Tonia Brooms at [facility]. Today we recommend the following:  [employer] application for trelegy  Follow up with Dr. Cristal Deer regarding aneurysm   Return in about 1 year (around 10/26/2023), or if symptoms worsen or fail to improve.    Please do your part to reduce the spread of COVID-19.

## 2022-11-02 ENCOUNTER — Encounter: Payer: Self-pay | Admitting: Pulmonary Disease

## 2022-11-06 ENCOUNTER — Telehealth: Payer: Self-pay

## 2022-11-06 NOTE — Telephone Encounter (Signed)
[employer] pt assist. paperwork faxed 11/06/22.

## 2022-11-09 ENCOUNTER — Other Ambulatory Visit: Payer: Self-pay

## 2022-11-09 ENCOUNTER — Ambulatory Visit: Payer: Medicare Other | Admitting: Family Medicine

## 2022-11-09 VITALS — BP 138/86 | HR 92 | Ht 71.0 in | Wt 208.0 lb

## 2022-11-09 DIAGNOSIS — G8929 Other chronic pain: Secondary | ICD-10-CM

## 2022-11-09 DIAGNOSIS — M25511 Pain in right shoulder: Secondary | ICD-10-CM

## 2022-11-09 DIAGNOSIS — M25512 Pain in left shoulder: Secondary | ICD-10-CM | POA: Diagnosis not present

## 2022-11-09 MED ORDER — METHOCARBAMOL 750 MG PO TABS: 750.0000 mg | ORAL_TABLET | Freq: Every evening | ORAL | 1 refills | Status: DC | PRN

## 2022-11-09 NOTE — Telephone Encounter (Signed)
[employer] paperwork was faxed off on 7.29 per encounter on 7.29 pt. Wants to know on status of getting meds getting to him please advise

## 2022-11-09 NOTE — Progress Notes (Signed)
Rubin Payor, PhD, LAT, ATC acting as a scribe for Clementeen Graham, MD.  John [name] is a 67 y.o. male who presents to [facility] at [facility] today for L shoulder pain. He works in Agricultural [employer]. Pt was previously seen by Dr. Katrinka Blazing on 08/22/22 for his R shoulder.   Today, pt c/o L shoulder pain ongoing for a few weeks. He c/o pain interfering w/ his sleep. Pt locates pain to the superior and anterior aspect of his L shoulder. No radiating pain.   Treatments tried: naproxen  Dx imaging: 06/06/22 R & L shoulder XR 03/22/2020 C-spine MRI             03/09/2020 C-spine x-ray  Pertinent review of systems: No fevers or chills  Relevant historical information: Chronic right shoulder pain   Exam:  BP 138/86   Pulse 92   Ht 5\' 11"  (1.803 m)   Wt 208 lb (94.3 kg)   SpO2 95%   BMI 29.01 kg/m  General: Well Developed, well nourished, and in no acute distress.   MSK: Left shoulder normal-appearing Normal motion pain with abduction. Intact strength. Positive Hawkins and Neer's test. Negative Yergason's and speeds test.    Lab and Radiology Results  Procedure: Real-time Ultrasound Guided Injection of left shoulder subacromial bursa Device: Philips Affiniti 50G/GE Logiq Images permanently stored and available for review in PACS Verbal informed consent obtained.  Discussed risks and benefits of procedure. Warned about infection, bleeding, hyperglycemia damage to structures among others. Patient expresses understanding and agreement Time-out conducted.   Noted no overlying erythema, induration, or other signs of local infection.   Skin prepped in a sterile fashion.   Local anesthesia: Topical Ethyl chloride.   With sterile technique and under real time ultrasound guidance: 40 mg of Kenalog and 2 mL of Marcaine injected into subacromial bursa. Fluid seen entering the bursa.   Completed without difficulty   Pain immediately resolved suggesting  accurate placement of the medication.   Advised to call if fevers/chills, erythema, induration, drainage, or persistent bleeding.   Images permanently stored and available for review in the ultrasound unit.  Impression: Technically successful ultrasound guided injection.    EXAM: RIGHT SHOULDER - 3 VIEW; LEFT SHOULDER - 3 VIEW   COMPARISON:  None Available.   FINDINGS: There is no evidence of fracture or dislocation. Bilateral acromioclavicular degenerative changes are noted with small osteophytes.   IMPRESSION: Degenerative changes.  No acute osseous abnormalities.     Electronically Signed   By: Layla Maw M.D.   On: 06/06/2022 13:44 I, Clementeen Graham, personally (independently) visualized and performed the interpretation of the images attached in this note. .      Assessment and Plan: 67 y.o. male with pain.  This is an acute exacerbation of a chronic problem.  He had a lot of treatment for his right but the left versus left.  Plan for subacromial injection.  Could proceed with intra-articular glenohumeral injection anytime as well.  Continue home exercise program. Recheck as needed. We talked about safe dosing of naproxen.  He has been taking occasionally 1000 mg at a time which is too much. Additionally prescribed methocarbamol for use at bedtime.  He finds tizanidine to be 2 fatigue in the next day.   PDMP not reviewed this encounter. Orders Placed This Encounter  Procedures   [place] LIMITED JOINT SPACE STRUCTURES UP LEFT(NO LINKED CHARGES)    Order Specific Question:   Reason for Exam (  SYMPTOM  OR DIAGNOSIS REQUIRED)    Answer:   left shoulder pain    Order Specific Question:   Preferred imaging location?    Answer:   Adult nurse [facility] ordered this encounter  Medications   methocarbamol (ROBAXIN) 750 MG tablet    Sig: Take 1 tablet (750 mg total) by mouth at bedtime as needed for muscle spasms.    Dispense:  30 tablet    Refill:  1      Discussed warning signs or symptoms. Please see discharge instructions. Patient expresses understanding.   The above documentation has been reviewed and is accurate and complete Clementeen Graham, M.D.

## 2022-11-09 NOTE — Patient Instructions (Addendum)
Thank you for coming in today.   You received an injection today. Seek immediate medical attention if the joint becomes red, extremely painful, or is oozing fluid.   Let me know how this goes. 

## 2022-11-15 NOTE — Progress Notes (Unsigned)
   Rubin Payor, PhD, LAT, ATC acting as a scribe for Clementeen Graham, MD.  John [name] is a 67 y.o. male who presents to [facility] at [facility] today for cont'd L shoulder pain. Pt was last seen by Dr. Denyse Amass on 11/09/22 and was given a L subacromial steroid injection and advised to cont HEP, and on proper naproxen dosing. He was also prescribed methocarbamol.  Today, pt reports ***  Dx imaging: 06/06/22 R & L shoulder XR 03/22/2020 C-spine MRI             03/09/2020 C-spine XR  Pertinent review of systems: ***  Relevant historical information: ***   Exam:  There were no vitals taken for this visit. General: Well Developed, well nourished, and in no acute distress.   MSK: ***    Lab and Radiology Results No results found for this or any previous visit (from the past 72 hour(s)). No results found.     Assessment and Plan: 67 y.o. male with ***   PDMP not reviewed this encounter. No orders of the defined types were placed in this encounter.  No orders of the defined types were placed in this encounter.    Discussed warning signs or symptoms. Please see discharge instructions. Patient expresses understanding.   ***

## 2022-11-16 ENCOUNTER — Encounter: Payer: Self-pay | Admitting: Family Medicine

## 2022-11-16 ENCOUNTER — Ambulatory Visit: Payer: Medicare Other | Admitting: Family Medicine

## 2022-11-16 ENCOUNTER — Other Ambulatory Visit: Payer: Self-pay

## 2022-11-16 VITALS — BP 168/100 | HR 76 | Ht 71.0 in | Wt 209.0 lb

## 2022-11-16 DIAGNOSIS — G8929 Other chronic pain: Secondary | ICD-10-CM | POA: Diagnosis not present

## 2022-11-16 DIAGNOSIS — M25512 Pain in left shoulder: Secondary | ICD-10-CM

## 2022-11-16 NOTE — Patient Instructions (Signed)
Thank you for coming in today.   You received an injection today. Seek immediate medical attention if the joint becomes red, extremely painful, or is oozing fluid.  

## 2022-11-28 ENCOUNTER — Encounter: Payer: Self-pay | Admitting: Family Medicine

## 2022-11-30 ENCOUNTER — Other Ambulatory Visit: Payer: Self-pay

## 2022-11-30 MED ORDER — TRELEGY ELLIPTA 100-62.5-25 MCG/ACT IN AEPB: 1.0000 | INHALATION_SPRAY | Freq: Every day | RESPIRATORY_TRACT | 12 refills | Status: DC

## 2022-12-07 ENCOUNTER — Other Ambulatory Visit: Payer: Self-pay

## 2022-12-07 ENCOUNTER — Telehealth: Payer: Self-pay

## 2022-12-07 MED ORDER — TRELEGY ELLIPTA 100-62.5-25 MCG/ACT IN AEPB: 1.0000 | INHALATION_SPRAY | Freq: Every day | RESPIRATORY_TRACT | Status: DC

## 2022-12-07 NOTE — Telephone Encounter (Signed)
I spoke the pt and informed him that his Fluticasone-Umeclidin-Vilant (TRELEGY ELLIPTA) 100-62.5-25 MCG/ACT AEPB  samples will be waiting for him. Pt informed me that he has called Andris Flurry several times about the [employer] paperwork and that he is not happy with the service that is being provided from her. I tried to reassure him that we are trying to get the [employer] situation handled as best as possible but he blames [place] for not getting what he needs. And threatened to leave the practice because he is not happy with our services. I informed the pt I will be noting everything that is told to me. Pt verbalized understanding. Nothing further needed at this time.

## 2022-12-13 ENCOUNTER — Encounter: Payer: Self-pay | Admitting: Internal Medicine

## 2022-12-13 ENCOUNTER — Other Ambulatory Visit: Payer: Self-pay | Admitting: Internal Medicine

## 2022-12-13 DIAGNOSIS — G8929 Other chronic pain: Secondary | ICD-10-CM

## 2022-12-13 DIAGNOSIS — M5010 Cervical disc disorder with radiculopathy, unspecified cervical region: Secondary | ICD-10-CM

## 2022-12-13 DIAGNOSIS — E291 Testicular hypofunction: Secondary | ICD-10-CM

## 2022-12-14 ENCOUNTER — Encounter: Payer: Self-pay | Admitting: Family Medicine

## 2022-12-15 ENCOUNTER — Other Ambulatory Visit: Payer: Self-pay

## 2022-12-15 MED ORDER — METHYLPREDNISOLONE 4 MG PO TBPK: ORAL_TABLET | ORAL | 0 refills | Status: DC

## 2022-12-18 ENCOUNTER — Other Ambulatory Visit: Payer: Self-pay

## 2022-12-18 DIAGNOSIS — M5412 Radiculopathy, cervical region: Secondary | ICD-10-CM

## 2022-12-19 ENCOUNTER — Other Ambulatory Visit: Payer: Self-pay | Admitting: Internal Medicine

## 2022-12-19 ENCOUNTER — Ambulatory Visit (INDEPENDENT_AMBULATORY_CARE_PROVIDER_SITE_OTHER): Payer: Medicare Other | Admitting: Internal Medicine

## 2022-12-19 ENCOUNTER — Encounter: Payer: Self-pay | Admitting: Internal Medicine

## 2022-12-19 VITALS — BP 140/108 | HR 94 | Temp 99.0°F | Ht 71.0 in | Wt 210.0 lb

## 2022-12-19 DIAGNOSIS — I10 Essential (primary) hypertension: Secondary | ICD-10-CM

## 2022-12-19 DIAGNOSIS — G8929 Other chronic pain: Secondary | ICD-10-CM

## 2022-12-19 DIAGNOSIS — M5010 Cervical disc disorder with radiculopathy, unspecified cervical region: Secondary | ICD-10-CM

## 2022-12-19 DIAGNOSIS — I480 Paroxysmal atrial fibrillation: Secondary | ICD-10-CM

## 2022-12-19 DIAGNOSIS — K219 Gastro-esophageal reflux disease without esophagitis: Secondary | ICD-10-CM

## 2022-12-19 DIAGNOSIS — E291 Testicular hypofunction: Secondary | ICD-10-CM | POA: Diagnosis not present

## 2022-12-19 DIAGNOSIS — N400 Enlarged prostate without lower urinary tract symptoms: Secondary | ICD-10-CM

## 2022-12-19 DIAGNOSIS — M159 Polyosteoarthritis, unspecified: Secondary | ICD-10-CM

## 2022-12-19 DIAGNOSIS — M5440 Lumbago with sciatica, unspecified side: Secondary | ICD-10-CM

## 2022-12-19 LAB — CBC WITH DIFFERENTIAL/PLATELET
Basophils Absolute: 0 10*3/uL (ref 0.0–0.1)
Basophils Relative: 0.6 % (ref 0.0–3.0)
Eosinophils Absolute: 0.1 10*3/uL (ref 0.0–0.7)
Eosinophils Relative: 1.2 % (ref 0.0–5.0)
HCT: 48.6 % (ref 39.0–52.0)
Hemoglobin: 15.1 g/dL (ref 13.0–17.0)
Lymphocytes Relative: 19.5 % (ref 12.0–46.0)
Lymphs Abs: 1.5 10*3/uL (ref 0.7–4.0)
MCHC: 31.2 g/dL (ref 30.0–36.0)
MCV: 90 fl (ref 78.0–100.0)
Monocytes Absolute: 1.1 10*3/uL — ABNORMAL HIGH (ref 0.1–1.0)
Monocytes Relative: 15.3 % — ABNORMAL HIGH (ref 3.0–12.0)
Neutro Abs: 4.7 10*3/uL (ref 1.4–7.7)
Neutrophils Relative %: 63.4 % (ref 43.0–77.0)
Platelets: 206 10*3/uL (ref 150.0–400.0)
RBC: 5.4 Mil/uL (ref 4.22–5.81)
RDW: 16.3 % — ABNORMAL HIGH (ref 11.5–15.5)
WBC: 7.4 10*3/uL (ref 4.0–10.5)

## 2022-12-19 LAB — URINALYSIS, ROUTINE W REFLEX MICROSCOPIC
Bilirubin Urine: NEGATIVE
Hgb urine dipstick: NEGATIVE
Leukocytes,Ua: NEGATIVE
Nitrite: NEGATIVE
Specific Gravity, Urine: >=1.03 — AB (ref 1.000–1.030)
Total Protein, Urine: 30 — AB
Urine Glucose: NEGATIVE
Urobilinogen, UA: 0.2 (ref 0.0–1.0)
pH: 6 (ref 5.0–8.0)

## 2022-12-19 LAB — HEPATIC FUNCTION PANEL
ALT: 30 U/L (ref 0–53)
AST: 22 U/L (ref 0–37)
Albumin: 4.1 g/dL (ref 3.5–5.2)
Alkaline Phosphatase: 59 U/L (ref 39–117)
Bilirubin, Direct: 0.1 mg/dL (ref 0.0–0.3)
Total Bilirubin: 0.6 mg/dL (ref 0.2–1.2)
Total Protein: 6.5 g/dL (ref 6.0–8.3)

## 2022-12-19 LAB — BASIC METABOLIC PANEL
BUN: 23 mg/dL (ref 6–23)
CO2: 31 meq/L (ref 19–32)
Calcium: 9.1 mg/dL (ref 8.4–10.5)
Chloride: 104 meq/L (ref 96–112)
Creatinine, Ser: 1.11 mg/dL (ref 0.40–1.50)
GFR: 69.65 mL/min (ref >60.00)
Glucose, Bld: 87 mg/dL (ref 70–99)
Potassium: 4 meq/L (ref 3.5–5.1)
Sodium: 142 meq/L (ref 135–145)

## 2022-12-19 LAB — PSA: PSA: 1.15 ng/mL (ref 0.10–4.00)

## 2022-12-19 MED ORDER — OLMESARTAN MEDOXOMIL 40 MG PO TABS: 40.0000 mg | ORAL_TABLET | Freq: Every day | ORAL | 0 refills | Status: DC

## 2022-12-19 MED ORDER — CARVEDILOL 6.25 MG PO TABS
6.2500 mg | ORAL_TABLET | Freq: Two times a day (BID) | ORAL | 1 refills | Status: DC
Start: 2022-12-19 — End: 2023-06-13

## 2022-12-19 NOTE — Patient Instructions (Signed)
Hypertension, Adult High blood pressure (hypertension) is when the force of blood pumping through the arteries is too strong. The arteries are the blood vessels that carry blood from the heart throughout the body. Hypertension forces the heart to work harder to pump blood and may cause arteries to become narrow or stiff. Untreated or uncontrolled hypertension can lead to a heart attack, heart failure, a stroke, kidney disease, and other problems. A blood pressure reading consists of a higher number over a lower number. Ideally, your blood pressure should be below 120/80. The first ("top") number is called the systolic pressure. It is a measure of the pressure in your arteries as your heart beats. The second ("bottom") number is called the diastolic pressure. It is a measure of the pressure in your arteries as the heart relaxes. What are the causes? The exact cause of this condition is not known. There are some conditions that result in high blood pressure. What increases the risk? Certain factors may make you more likely to develop high blood pressure. Some of these risk factors are under your control, including: Smoking. Not getting enough exercise or physical activity. Being overweight. Having too much fat, sugar, calories, or salt (sodium) in your diet. Drinking too much alcohol. Other risk factors include: Having a personal history of heart disease, diabetes, high cholesterol, or kidney disease. Stress. Having a family history of high blood pressure and high cholesterol. Having obstructive sleep apnea. Age. The risk increases with age. What are the signs or symptoms? High blood pressure may not cause symptoms. Very high blood pressure (hypertensive crisis) may cause: Headache. Fast or irregular heartbeats (palpitations). Shortness of breath. Nosebleed. Nausea and vomiting. Vision changes. Severe chest pain, dizziness, and seizures. How is this diagnosed? This condition is diagnosed by  measuring your blood pressure while you are seated, with your arm resting on a flat surface, your legs uncrossed, and your feet flat on the floor. The cuff of the blood pressure monitor will be placed directly against the skin of your upper arm at the level of your heart. Blood pressure should be measured at least twice using the same arm. Certain conditions can cause a difference in blood pressure between your right and left arms. If you have a high blood pressure reading during one visit or you have normal blood pressure with other risk factors, you may be asked to: Return on a different day to have your blood pressure checked again. Monitor your blood pressure at home for 1 week or longer. If you are diagnosed with hypertension, you may have other blood or imaging tests to help your health care provider understand your overall risk for other conditions. How is this treated? This condition is treated by making healthy lifestyle changes, such as eating healthy foods, exercising more, and reducing your alcohol intake. You may be referred for counseling on a healthy diet and physical activity. Your health care provider may prescribe medicine if lifestyle changes are not enough to get your blood pressure under control and if: Your systolic blood pressure is above 130. Your diastolic blood pressure is above 80. Your personal target blood pressure may vary depending on your medical conditions, your age, and other factors. Follow these instructions at home: Eating and drinking  Eat a diet that is high in fiber and potassium, and low in sodium, added sugar, and fat. An example of this eating plan is called the DASH diet. DASH stands for Dietary Approaches to Stop Hypertension. To eat this way: Eat   plenty of fresh fruits and vegetables. Try to fill one half of your plate at each meal with fruits and vegetables. Eat whole grains, such as whole-wheat pasta, brown rice, or whole-grain bread. Fill about one  fourth of your plate with whole grains. Eat or drink low-fat dairy products, such as skim milk or low-fat yogurt. Avoid fatty cuts of meat, processed or cured meats, and poultry with skin. Fill about one fourth of your plate with lean proteins, such as fish, chicken without skin, beans, eggs, or tofu. Avoid pre-made and processed foods. These tend to be higher in sodium, added sugar, and fat. Reduce your daily sodium intake. Many people with hypertension should eat less than 1,500 mg of sodium a day. Do not drink alcohol if: Your health care provider tells you not to drink. You are pregnant, may be pregnant, or are planning to become pregnant. If you drink alcohol: Limit how much you have to: 0-1 drink a day for women. 0-2 drinks a day for men. Know how much alcohol is in your drink. In the [place] one drink equals one 12 oz bottle of beer (355 mL), one 5 oz glass of wine (148 mL), or one 1 oz glass of hard liquor (44 mL). Lifestyle  Work with your health care provider to maintain a healthy body weight or to lose weight. Ask what an ideal weight is for you. Get at least 30 minutes of exercise that causes your heart to beat faster (aerobic exercise) most days of the week. Activities may include walking, swimming, or biking. Include exercise to strengthen your muscles (resistance exercise), such as Pilates or lifting weights, as part of your weekly exercise routine. Try to do these types of exercises for 30 minutes at least 3 days a week. Do not use any products that contain nicotine or tobacco. These products include cigarettes, chewing tobacco, and vaping devices, such as e-cigarettes. If you need help quitting, ask your health care provider. Monitor your blood pressure at home as told by your health care provider. Keep all follow-up visits. This is important. Medicines Take over-the-counter and prescription medicines only as told by your health care provider. Follow directions carefully. Blood  pressure medicines must be taken as prescribed. Do not skip doses of blood pressure medicine. Doing this puts you at risk for problems and can make the medicine less effective. Ask your health care provider about side effects or reactions to medicines that you should watch for. Contact a health care provider if you: Think you are having a reaction to a medicine you are taking. Have headaches that keep coming back (recurring). Feel dizzy. Have swelling in your ankles. Have trouble with your vision. Get help right away if you: Develop a severe headache or confusion. Have unusual weakness or numbness. Feel faint. Have severe pain in your chest or abdomen. Vomit repeatedly. Have trouble breathing. These symptoms may be an emergency. Get help right away. Call 911. Do not wait to see if the symptoms will go away. Do not drive yourself to the hospital. Summary Hypertension is when the force of blood pumping through your arteries is too strong. If this condition is not controlled, it may put you at risk for serious complications. Your personal target blood pressure may vary depending on your medical conditions, your age, and other factors. For most people, a normal blood pressure is less than 120/80. Hypertension is treated with lifestyle changes, medicines, or a combination of both. Lifestyle changes include losing weight, eating a healthy,   low-sodium diet, exercising more, and limiting alcohol. This information is not intended to replace advice given to you by your health care provider. Make sure you discuss any questions you have with your health care provider. Document Revised: 02/01/2021 Document Reviewed: 02/01/2021 Elsevier Patient Education  2024 [employer] Inc.  

## 2022-12-19 NOTE — Progress Notes (Unsigned)
Subjective:  Patient ID: John [name], male    DOB: [birth year]  Age: 67 y.o. MRN: [id number]  CC: Hypertension   HPI Harshal Burr Spade presents for f/up ----  Discussed the use of AI scribe software for clinical note transcription with the patient, who gave verbal consent to proceed.  History of Present Illness   The patient, with a history of hypertension, orthopedic issues, and pulmonary disease, reports no recent headaches, blurred vision, chest pain, or shortness of breath. He acknowledges a history of consistently high blood pressure readings, including a recent reading of 168/100, which he attributes to a "white coat phenomenon" and a recent neck injection. He is currently on a regimen of steroids, including a recent half-course of prednisone, due to the interim period between injections for his orthopedic condition.  The patient is also managing a pulmonary condition with Breo, but is in the process of transitioning to Trelegy due to cost considerations now that he is on Medicare. He reports no recent coughing, wheezing, or shortness of breath.  A recent lung scan revealed a kidney stone and a hiatal hernia. The kidney stone is asymptomatic and the hiatal hernia symptoms have been managed with dietary modifications, including avoiding late-night eating.       Outpatient Medications Prior to Visit  Medication Sig Dispense Refill   BREO ELLIPTA 100-25 MCG/ACT AEPB Inhale 1 puff into the lungs daily. 60 each 5   CHELATED MAGNESIUM PO Take 200 mg by mouth in the morning.     cholecalciferol (VITAMIN D3) 25 MCG (1000 UT) tablet Take 1,000 Units by mouth every evening.     Coenzyme Q10 (CO Q 10 PO) Take 400 mg by mouth in the morning.     diltiazem (CARDIZEM CD) 120 MG 24 hr capsule TAKE 1 CAPSULE(120 MG) BY MOUTH DAILY 90 capsule 0   diltiazem (CARDIZEM) 30 MG tablet TAKE 1 TABLET BY MOUTH EVERY 4 HOURS AS NEEDED HR ABOVE 100 AND TOP NUMBER B/P GREATER THAN 100 30 tablet 1   esomeprazole  (NEXIUM) 20 MG capsule Take 1 capsule (20 mg total) by mouth daily at 12 noon. 90 capsule 0   Flaxseed, Linseed, (FLAX SEED OIL PO) Take 1 tablet by mouth every morning.     fluticasone (FLONASE) 50 MCG/ACT nasal spray Place 2 sprays into both nostrils in the morning and at bedtime. 48 g 0   Fluticasone-Umeclidin-Vilant (TRELEGY ELLIPTA) 100-62.5-25 MCG/ACT AEPB Inhale 1 puff into the lungs daily. 1 each 12   methocarbamol (ROBAXIN) 750 MG tablet Take 1 tablet (750 mg total) by mouth at bedtime as needed for muscle spasms. 30 tablet 1   methylPREDNISolone (MEDROL DOSEPAK) 4 MG TBPK tablet 4 pills day 1, 3 pills day 2-3, 2 pills day 4-7, 1 pill day 8-10, 1/2 pill thereafter until gone 21 tablet 0   montelukast (SINGULAIR) 10 MG tablet Take 1 tablet (10 mg total) by mouth daily. 90 tablet 0   Multiple Vitamins-Minerals (MULTIVITAMIN WITH MINERALS) tablet Take 1 tablet by mouth in the morning.     naltrexone (DEPADE) 50 MG tablet Take 0.5 tablets (25 mg total) by mouth at bedtime. 15 tablet 5   naproxen (NAPROSYN) 500 MG tablet Take 1 tablet (500 mg total) by mouth 2 (two) times daily as needed. 60 tablet 1   olmesartan (BENICAR) 40 MG tablet Take 1 tablet (40 mg total) by mouth daily. 90 tablet 0   Omega-3 Fatty Acids (FISH OIL) 1200 MG CAPS Take 1 capsule by  mouth in the morning and at bedtime.     rosuvastatin (CRESTOR) 20 MG tablet TAKE 1 TABLET(20 MG) BY MOUTH DAILY 90 tablet 0   sildenafil (VIAGRA) 100 MG tablet Take 1 tablet (100 mg total) by mouth daily as needed for erectile dysfunction. 10 tablet 4   testosterone (ANDROGEL) 50 MG/5GM (1%) GEL Place 10 g onto the skin daily. 900 g 1   traMADol (ULTRAM) 50 MG tablet Take 1 tablet (50 mg total) by mouth every 12 (twelve) hours as needed. 60 tablet 1   vitamin B-12 (CYANOCOBALAMIN) 500 MCG tablet Take 500 mcg by mouth in the morning.     carvedilol (COREG) 3.125 MG tablet TAKE 1 TABLET(3.125 MG) BY MOUTH TWICE DAILY WITH A MEAL 180 tablet 0    Fluticasone-Umeclidin-Vilant (TRELEGY ELLIPTA) 100-62.5-25 MCG/ACT AEPB Inhale 1 each into the lungs daily. (Patient not taking: Reported on 12/19/2022)     No facility-administered medications prior to visit.    ROS Review of Systems  Objective:  BP (!) 140/108 (BP Location: Left Arm, Patient Position: Sitting, Cuff Size: Normal)   Pulse 94   Temp 99 F (37.2 C) (Oral)   Ht 5\' 11"  (1.803 m)   Wt 210 lb (95.3 kg)   SpO2 97%   BMI 29.29 kg/m   BP Readings from Last 3 Encounters:  12/19/22 (!) 140/108  11/16/22 (!) 168/100  11/09/22 138/86    Wt Readings from Last 3 Encounters:  12/19/22 210 lb (95.3 kg)  11/16/22 209 lb (94.8 kg)  11/09/22 208 lb (94.3 kg)    Physical Exam Cardiovascular:     Rate and Rhythm: Normal rate and regular rhythm.     Heart sounds: Normal heart sounds, S1 normal and S2 normal.     Comments: EKG- NSR, 80 bpm No LVH, Q waves, or ST/T waves  Musculoskeletal:     Right lower leg: No edema.     Left lower leg: No edema.     Lab Results  Component Value Date   WBC 7.4 12/19/2022   HGB 15.1 12/19/2022   HCT 48.6 12/19/2022   PLT 206.0 12/19/2022   GLUCOSE 87 12/19/2022   CHOL 121 05/22/2022   TRIG 96.0 05/22/2022   HDL 37.00 (L) 05/22/2022   LDLCALC 65 05/22/2022   ALT 30 12/19/2022   AST 22 12/19/2022   NA 142 12/19/2022   K 4.0 12/19/2022   CL 104 12/19/2022   CREATININE 1.11 12/19/2022   BUN 23 12/19/2022   CO2 31 12/19/2022   TSH 1.19 05/22/2022   PSA 1.15 12/19/2022   HGBA1C 5.8 05/22/2022    CT ANGIO CHEST AORTA W/CM & OR WO/CM  Result Date: 10/20/2022 CLINICAL DATA:  Thoracic aortic aneurysm seen on previous CT, chest pain EXAM: CT ANGIOGRAPHY CHEST WITH CONTRAST TECHNIQUE: Multidetector CT imaging of the chest was performed using the standard protocol during bolus administration of intravenous contrast. Multiplanar CT image reconstructions and MIPs were obtained to evaluate the vascular anatomy. RADIATION DOSE REDUCTION:  This exam was performed according to the departmental dose-optimization program which includes automated exposure control, adjustment of the mA and/or kV according to patient size and/or use of iterative reconstruction technique. CONTRAST:  75mL OMNIPAQUE IOHEXOL 350 MG/ML SOLN COMPARISON:  Noncontrast CT done on 10/09/2022 FINDINGS: Cardiovascular: There are no intraluminal filling defects in central pulmonary artery branches. Motion artifacts limit evaluation of small peripheral pulmonary artery branches. There is homogeneous enhancement in thoracic aorta. There is no demonstrable intimal flap. There is aneurysmal  dilation of the proximal descending thoracic aorta measuring 3.8 x 3.4 cm. Aortic arch measures 2.9 cm. Ascending thoracic aorta measures 3.7 cm. Distal course of descending thoracic aorta measures 2.5 cm. There are scattered atherosclerotic plaques and calcifications in thoracic aorta and its major branches. Mediastinum/Nodes: No significant lymphadenopathy is seen. Lungs/Pleura: There is no focal pulmonary consolidation. There is elevation of left hemidiaphragm. There is no pleural effusion or pneumothorax. Small linear density in right middle lobe may suggest minimal scarring. Upper Abdomen: There is 3 mm calcific density in the left kidney. Small hiatal hernia is seen. Musculoskeletal: No acute findings are seen. Review of the MIP images confirms the above findings. 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 There is no evidence of thoracic aortic dissection. Aortic arteriosclerosis. There is no evidence of central pulmonary artery embolism. Evaluation of small peripheral pulmonary artery branches is limited by motion artifacts. There is no focal pulmonary consolidation. There is no pleural effusion or pneumothorax. There is 3 mm left renal calculus.  Small hiatal hernia. Electronically Signed   By: Ernie Avena M.D.   On: 10/20/2022 15:10     Assessment & Plan:  Benign prostatic hyperplasia without lower urinary tract symptoms -     PSA; Future -     Urinalysis, Routine w reflex microscopic; Future  Primary hypertension -     Basic metabolic panel; Future -     Hepatic function panel; Future -     Urinalysis, Routine w reflex microscopic; Future -     EKG 12-Lead -     Carvedilol; Take 1 tablet (6.25 mg total) by mouth 2 (two) times daily with a meal.  Dispense: 180 tablet; Refill: 1  Gastroesophageal reflux disease without esophagitis -     CBC with Differential/Platelet; Future -     Hepatic function panel; Future  Hypogonadism, male -     Testosterone Total,Free,Bio, Males; Future -     CBC with Differential/Platelet; Future -     Hepatic function panel; Future  PAF (paroxysmal atrial fibrillation) (HCC) -     Carvedilol; Take 1 tablet (6.25 mg total) by mouth 2 (two) times daily with a meal.  Dispense: 180 tablet; Refill: 1     Follow-up: Return in about 4 months (around 04/20/2023).  Sanda Linger, MD

## 2022-12-20 ENCOUNTER — Telehealth: Payer: Self-pay | Admitting: Internal Medicine

## 2022-12-20 LAB — TESTOSTERONE TOTAL,FREE,BIO, MALES
Albumin: 4.3 g/dL (ref 3.6–5.1)
Sex Hormone Binding: 20 nmol/L — ABNORMAL LOW (ref 22–77)
Testosterone: 130 ng/dL — ABNORMAL LOW (ref 250–827)

## 2022-12-20 MED ORDER — TRAMADOL HCL 50 MG PO TABS
50.0000 mg | ORAL_TABLET | Freq: Two times a day (BID) | ORAL | 1 refills | Status: DC | PRN
Start: 2022-12-20 — End: 2023-04-03

## 2022-12-20 MED ORDER — TESTOSTERONE 50 MG/5GM (1%) TD GEL
10.0000 g | Freq: Every day | TRANSDERMAL | 1 refills | Status: DC
Start: 2022-12-20 — End: 2023-03-15

## 2022-12-20 NOTE — Telephone Encounter (Signed)
Patient called and would like a med refill traMADol (ULTRAM) 50 MG tablet  Please send to [facility] on [address].

## 2022-12-20 NOTE — Discharge Instructions (Signed)

## 2022-12-21 ENCOUNTER — Ambulatory Visit
Admission: RE | Admit: 2022-12-21 | Discharge: 2022-12-21 | Disposition: A | Discharge status: Home / Self Care | Payer: Medicare Other | Source: Ambulatory Visit | Attending: Family Medicine | Admitting: Family Medicine

## 2022-12-21 ENCOUNTER — Encounter: Payer: Self-pay | Admitting: Internal Medicine

## 2022-12-21 DIAGNOSIS — M4722 Other spondylosis with radiculopathy, cervical region: Secondary | ICD-10-CM | POA: Diagnosis not present

## 2022-12-21 DIAGNOSIS — M5412 Radiculopathy, cervical region: Secondary | ICD-10-CM

## 2022-12-21 MED ORDER — IOPAMIDOL (ISOVUE-M 300) INJECTION 61%
1.0000 mL | Freq: Once | INTRAMUSCULAR | Status: AC | PRN
  Administered 2022-12-21: 1 mL via EPIDURAL

## 2022-12-21 MED ORDER — TRIAMCINOLONE ACETONIDE 40 MG/ML IJ SUSP (RADIOLOGY)
60.0000 mg | Freq: Once | INTRAMUSCULAR | Status: AC
  Administered 2022-12-21: 60 mg via EPIDURAL

## 2022-12-29 ENCOUNTER — Encounter: Payer: Self-pay | Admitting: Pulmonary Disease

## 2022-12-31 ENCOUNTER — Other Ambulatory Visit: Payer: Self-pay | Admitting: Internal Medicine

## 2022-12-31 ENCOUNTER — Other Ambulatory Visit: Payer: Self-pay | Admitting: Family Medicine

## 2022-12-31 DIAGNOSIS — J309 Allergic rhinitis, unspecified: Secondary | ICD-10-CM

## 2022-12-31 DIAGNOSIS — K219 Gastro-esophageal reflux disease without esophagitis: Secondary | ICD-10-CM

## 2023-01-05 NOTE — Telephone Encounter (Signed)
01/05/23, 1550pm Spoke with Mr. Claiborne by phone.  He verified his identity by DOB prior to initiating any conversation. Explained that due to a change in staffing I see that his need for help with his Patient Assistance may have been overlooked.  He agreed that is the case.  He is working and does not have time to come into the office to discuss right now, but he will be more available in 2 weeks.  He states he still has 5 1/2 weeks of samples available and is not going without medication. Explained next week I will explore with [employer] and see if it can be determined where his application stands.  I will call him back by early next week so we can discuss my findings and move forward with next steps. Mr. Pile was agreeable, appreciative, and voiced understanding.

## 2023-01-08 NOTE — Progress Notes (Unsigned)
01/08/23 Spoke to French Ana A, rep with [employer], regarding pt's Patient Assistance application for Trelegy. French Ana reports the application has been denied because the prescription got cut off on the fax, and they also need a print out from his pharmacy (signed and dated by pharmacy) showing all the meds he has bought this year (med cost and what he paid).  Left pt a VM asking him to return call so this can be explained.  I will fax again when all info is obtained.  Will check back with pt tomorrow if no return call.  01/10/23 Spoke with pt by phone.  He was able to verify his full name and DOB.  Advised of what was asked by [employer] to complete Patient Assistance application.  He will call the pharmacy about the print out.  Will return fax the needed info when obtained.

## 2023-01-18 ENCOUNTER — Encounter: Payer: Self-pay | Admitting: Family Medicine

## 2023-01-18 ENCOUNTER — Other Ambulatory Visit: Payer: Self-pay | Admitting: Family Medicine

## 2023-01-18 DIAGNOSIS — E236 Other disorders of pituitary gland: Secondary | ICD-10-CM | POA: Diagnosis not present

## 2023-01-18 DIAGNOSIS — M4710 Other spondylosis with myelopathy, site unspecified: Secondary | ICD-10-CM | POA: Diagnosis not present

## 2023-01-18 DIAGNOSIS — M4712 Other spondylosis with myelopathy, cervical region: Secondary | ICD-10-CM | POA: Diagnosis not present

## 2023-01-18 DIAGNOSIS — M4803 Spinal stenosis, cervicothoracic region: Secondary | ICD-10-CM | POA: Diagnosis not present

## 2023-01-18 DIAGNOSIS — M4802 Spinal stenosis, cervical region: Secondary | ICD-10-CM | POA: Diagnosis not present

## 2023-01-18 MED ORDER — METHYLPREDNISOLONE 4 MG PO TBPK: ORAL_TABLET | ORAL | 0 refills | Status: DC

## 2023-01-19 ENCOUNTER — Telehealth: Payer: Self-pay | Admitting: *Deleted

## 2023-01-19 NOTE — Telephone Encounter (Signed)
Pre-operative Risk Assessment    Patient Name: John [name]  DOB: [birth year] MRN: [id number]   DATE OF LAST VISIT:  04/25/22 DR. CHRISTOPHER DATE OF NEXT VISIT: 01/25/23 DR. CHRISTOPHER  Request for Surgical Clearance    Procedure:   ANTERIOR CERVICAL FUSION  Date of Surgery:  Clearance TBD                                 Surgeon:  DR. Hoyt Koch Surgeon's Group or Practice Name:  [facility] Phone number:  [phone] Fax number:  [phone] ATTN: Erie Noe   Type of Clearance Requested:   - Medical ; NO MEDICATIONS LISTED AS NEEDING TO BE HELD   Type of Anesthesia:  General    Additional requests/questions:    John [name]   01/19/2023, 5:38 PM

## 2023-01-22 NOTE — Telephone Encounter (Signed)
Pt has appt 01/25/23 with Dr. Cristal Deer. I will update all parties involved./

## 2023-01-22 NOTE — Telephone Encounter (Signed)
Primary Cardiologist:Bridgette Cristal Deer, MD  Chart reviewed as part of pre-operative protocol coverage. Because of Hannibal Skalla Signorelli's past medical history and time since last visit, he/she will require a follow-up visit in order to better assess preoperative cardiovascular risk.  Pre-op covering staff: -Patient has an appointment on 01/25/2023 with Dr. Cristal Deer at which time clearance will be addressed.  Appointment notes have been updated. - Please contact requesting surgeon's office via preferred method (i.e, phone, fax) to inform them of need for appointment prior to surgery.  If applicable, this message will also be routed to pharmacy pool and/or primary cardiologist for input on holding anticoagulant/antiplatelet agent as requested below so that this information is available at time of patient's appointment.   Levi Aland, NP-C  01/22/2023, 10:27 AM [address] Office [phone] Fax [phone]

## 2023-01-25 ENCOUNTER — Ambulatory Visit (HOSPITAL_BASED_OUTPATIENT_CLINIC_OR_DEPARTMENT_OTHER): Payer: Medicare Other | Admitting: Cardiology

## 2023-01-25 NOTE — Progress Notes (Incomplete)
Cardiology Office Note:  .    Date:  01/25/2023  ID:  John [name], DOB 10/01/1955, MRN [id number] PCP: [name], MD  [facility] Providers Cardiologist:  Jodelle Red, MD { Click to update primary MD,subspecialty MD or APP then REFRESH:1}    History of Present Illness: .    John [name] is a 67 y.o. male with a hx of nonobstructive CAD, aortic aneurysm, paroxysmal atrial fibrillation, hypertension, hyperlipidemia, Morton's neuroma, bronchitis, asthma, COPD, GERD, osteoarthritis, obstructive sleep apnea on CPAP, and pancreatitis, who is seen for follow-up today. I initially met him 04/25/2022 as a new consult for preoperative clearance and stress test.   Previously seen by his PCP Dr. Sanda Linger on 01/30/22 for several month history of chest pain described as "twinges" at rest. Hx of paroxysmal afib, not on anticoagulation; good rate/rhythm control. An ETT was ordered but not performed due to orthopedic limitations. CT calcium score was performed instead showing a coronary calcium score of 41.7. He was cleared for surgery at that time as moderate risk.    Originally scheduled for anterior cervical discectomy and fusion (ACDF C5-6) 03/28/22 which was initially cancelled due to hypotension on induction (unresponsive to pressors and the case was aborted). Dr. Maisie Fus moved the case to [facility] and ordered preop echocardiogram on the morning of surgery 03/31/2022. Per anesthesiology it was not felt to be safe to proceed with general anesthesia without a stress test given his history of chest pain, comorbidities, and prior reaction to induction. Reportedly he was given a large dose of gabapentin prior to his surgery, which caused him to feel drowsy and have surreal dreams. He recalls labile blood pressures ranging from 120s to 170s systolic while in clinic at that time.    ***Initial preoperative assessment: Planned surgery: Anterior cervical discectomy and fusion (ACDF  C5-6) Pertinent past cardiac history: None aside from atrial fibrillation. Prior cardiac workup: Echocardiogram 03/2022 revealed LVEF 60-65% with grade 1 diastolic dysfunction. Ascending aortic aneurysm measured at 40 mm. CT 02/2022 showed coronary calcium score of 41.7 with calcifications of the LAD. History of valve disease: none History of CAD/PAD/CVA/TIA:  CAD. History of heart failure: none History of arrhythmia: Atrial fibrillation. Two times required cardioversion, both times he notes was in the setting of titration off prednisone. He notes his heart rate has always been higher throughout his life. Most recently, he had a 12 hour arrhythmic episode last June/July that was distinct from his Afib. On anticoagulation:  No. History of hypertension: Yes. History of OSA: +Obstructive sleep apnea on CPAP. History of anesthesia complications: Yes, became hypotensive on induction.  Current symptoms: Once in a while he experiences a strange chest discomfort localized in a small, vertical line just to the right of his sternum. He denies soreness on palpation. Additionally he complains of a loss of sensation on the top of his feet. While using a hairdryer, he was able to feel the air on his feet, but not the heat. Functional capacity: He states he is typically an active person.   At our initial visit, his blood pressure was 130/78, which he states is the lowest it has been in the clinical setting. He had been receiving therapeutic injections of kenalog every 2 months or so. He is a former smoker for 38 years. We pursued cardiac CTA 04/2022 which revealed mild, nonobstructive CAD in the LAD with a coronary calcium score 45.5. Recommended to add 81 mg ASA, however he subsequently reported intolerance to ASA (both  standard and enteric coated) due to exacerbation of IBS and it was discontinued. He had a CT scan for lung cancer screening 10/09/22 with his pulmonologist Dr. Tonia Brooms and was found to have an aortic  aneurysm (3.8 cm). He then had a chest CTA 10/20/22 confirming a 3.8 x 3.4 cm focal aneurysm of the proximal descending thoracic aorta without evidence of dissection. Incidental findings of 3 mm left renal calculus and a small hiatal hernia.  Today, he also presents for preoperative cardiovascular risk assessment. He is going to proceed with the anterior cervical fusion as above with general anesthesia, to be performed by Dr. Hoyt Koch, date TBD.   He denies any palpitations, chest pain, shortness of breath, peripheral edema, lightheadedness, headaches, syncope, orthopnea, or PND.  ROS:  Please see the history of present illness. ROS otherwise negative except as noted.  (+)  Studies Reviewed: Marland Kitchen         CTA Chest/Aorta  10/20/2022: FINDINGS: Cardiovascular: There are no intraluminal filling defects in central pulmonary artery branches. Motion artifacts limit evaluation of small peripheral pulmonary artery branches. There is homogeneous enhancement in thoracic aorta. There is no demonstrable intimal flap. There is aneurysmal dilation of the proximal descending thoracic aorta measuring 3.8 x 3.4 cm. Aortic arch measures 2.9 cm. Ascending thoracic aorta measures 3.7 cm. Distal course of descending thoracic aorta measures 2.5 cm. There are scattered atherosclerotic plaques and calcifications in thoracic aorta and its major branches.  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 There is no evidence of thoracic aortic dissection. Aortic arteriosclerosis.   There is no evidence of central pulmonary artery embolism. Evaluation of small peripheral pulmonary artery branches is limited by motion artifacts.   There is no focal pulmonary consolidation. There is no pleural effusion or pneumothorax.   There is 3 mm left renal calculus.  Small hiatal hernia.  Cardiac CTA  05/01/2022: IMPRESSION: 1. Coronary artery calcium score 45.5 Agatston  units. This places the patient in the 45th percentile for age and gender, suggesting intermediate risk for future cardiac events.   2.  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  Risk Assessment/Calculations:    {Does this patient have ATRIAL FIBRILLATION?:[id number]} No BP recorded.  {Refresh Note OR Click here to enter BP  :1}***       Physical Exam:    VS:  There were no vitals taken for this visit.   Wt Readings from Last 3 Encounters:  12/19/22 210 lb (95.3 kg)  11/16/22 209 lb (94.8 kg)  11/09/22 208 lb (94.3 kg)    GEN: Well nourished, well developed in no acute distress HEENT: Normal, moist mucous membranes NECK: No JVD CARDIAC: regular rhythm, normal S1 and S2, no rubs or gallops. ***No murmur. VASCULAR: Radial and DP pulses 2+ bilaterally. No carotid bruits RESPIRATORY:  Clear to auscultation without rales, wheezing or rhonchi  ABDOMEN: Soft, non-tender, non-distended MUSCULOSKELETAL:  Ambulates independently SKIN: Warm and dry, no edema NEUROLOGIC:  Alert and oriented x 3. No focal neuro deficits noted. PSYCHIATRIC:  Normal affect   ASSESSMENT AND PLAN: .    ***Plan:  ***Prior Plan: Preoperative cardiovascular evaluation Chest pain -it is unclear to me what happened during induction during his first procedure on 03/28/22. He plans to personally get records. He does note that he received gabapentin prior to surgery, which he has had issues with in the past, and he reports already feeling "different" prior to induction -echo unremarkable, ECG unremarkable -chest pain is  atypical, but given concern, best option is an anatomic test over a nuclear stress test. This will give [place] full understanding of his coronary anatomy and potential risk. Based on this, discussed cardiac CT. He is amenable.  -given borderline BP and that he is already on beta blocker and calcium channel blocker, will use ivabradine for rate control so that he will have BP room for nitro   Paroxysmal atrial  fibrillation -denies recent events -chadsvasc 1 currently, will be 2 after his next birthday. Not currently on anticoagulation   Hypertension -well controlled today   Cardiac risk counseling and prevention recommendations: -recommend heart healthy/Mediterranean diet, with whole grains, fruits, vegetable, fish, lean meats, nuts, and olive oil. Limit salt. -recommend moderate walking, 3-5 times/week for 30-50 minutes each session. Aim for at least 150 minutes.week. Goal should be pace of 3 miles/hours, or walking 1.5 miles in 30 minutes -recommend avoidance of tobacco products. Avoid excess alcohol. -ASCVD risk score: The 10-year ASCVD risk score (Arnett DK, et al., 2019) is: 18.7%   Values used to calculate the score:     Age: 89 years     Sex: Male     Is Non-Hispanic African American: No     Diabetic: No     Tobacco smoker: No     Systolic Blood Pressure: 130 mmHg     Is BP treated: Yes     HDL Cholesterol: 35.2 mg/dL     Total Cholesterol: 225 mg/dL       {Are you ordering a CV Procedure (e.g. stress test, cath, DCCV, TEE, etc)?   Press F2        :[id number]}  Dispo: Follow-up in *** months, or sooner as needed.  I,Mathew Stumpf,acting as a Neurosurgeon for [employer], MD.,have documented all relevant documentation on the behalf of Jodelle Red, MD,as directed by  Jodelle Red, MD while in the presence of Jodelle Red, MD.  ***  Signed, Carlena Bjornstad

## 2023-01-29 ENCOUNTER — Encounter: Payer: Self-pay | Admitting: Pulmonary Disease

## 2023-01-29 ENCOUNTER — Ambulatory Visit (HOSPITAL_BASED_OUTPATIENT_CLINIC_OR_DEPARTMENT_OTHER): Payer: Medicare Other | Admitting: Family

## 2023-01-29 ENCOUNTER — Encounter (HOSPITAL_BASED_OUTPATIENT_CLINIC_OR_DEPARTMENT_OTHER): Payer: Self-pay | Admitting: Family

## 2023-01-29 ENCOUNTER — Other Ambulatory Visit: Payer: Self-pay

## 2023-01-29 VITALS — BP 104/76 | HR 78 | Ht 71.0 in | Wt 203.2 lb

## 2023-01-29 DIAGNOSIS — I10 Essential (primary) hypertension: Secondary | ICD-10-CM | POA: Diagnosis not present

## 2023-01-29 DIAGNOSIS — I719 Aortic aneurysm of unspecified site, without rupture: Secondary | ICD-10-CM

## 2023-01-29 DIAGNOSIS — I2510 Atherosclerotic heart disease of native coronary artery without angina pectoris: Secondary | ICD-10-CM

## 2023-01-29 DIAGNOSIS — I480 Paroxysmal atrial fibrillation: Secondary | ICD-10-CM | POA: Diagnosis not present

## 2023-01-29 DIAGNOSIS — E785 Hyperlipidemia, unspecified: Secondary | ICD-10-CM

## 2023-01-29 DIAGNOSIS — M4802 Spinal stenosis, cervical region: Secondary | ICD-10-CM | POA: Diagnosis not present

## 2023-01-29 DIAGNOSIS — Z6828 Body mass index (BMI) 28.0-28.9, adult: Secondary | ICD-10-CM | POA: Diagnosis not present

## 2023-01-29 DIAGNOSIS — Z01810 Encounter for preprocedural cardiovascular examination: Secondary | ICD-10-CM

## 2023-01-29 MED ORDER — METHYLPREDNISOLONE 4 MG PO TBPK: ORAL_TABLET | ORAL | 0 refills | Status: DC

## 2023-01-29 NOTE — Patient Instructions (Addendum)
Medication Instructions:   Hold Olmesartan the morning of his procedure (Let us know if you would like to start anticoagulation. If you have atrial fibrillation while not on anticoagulation your risk of stroke is 3.2%.)  *If you need a refill on your cardiac medications before your next appointment, please call your pharmacy*   Follow-Up: At [facility], you and your health needs are our priority.  As part of our continuing mission to provide you with exceptional heart care, we have created designated Provider Care Teams.  These Care Teams include your primary Cardiologist (physician) and Advanced Practice Providers (APPs -  Physician Assistants and Nurse Practitioners) who all work together to provide you with the care you need, when you need it.  We recommend signing up for the patient portal called "MyChart".  Sign up information is provided on this After Visit Summary.  MyChart is used to connect with patients for Virtual Visits (Telemedicine).  Patients are able to view lab/test results, encounter notes, upcoming appointments, etc.  Non-urgent messages can be sent to your provider as well.   To learn more about what you can do with MyChart, go to [email or web address].    Your next appointment:   Follow up with Dr. Cristal Deer in 6 months.  Other Instructions  Information About Your Aneurysm  One of your tests has shown an aneurysm of your descending aorta measuring 3.8x3.4cm. The word "aneurysm" refers to a bulge in an artery (blood vessel). Most people think of them in the context of an emergency, but yours was found incidentally. At this point there is nothing you need to do from a procedure standpoint, but there are some important things to keep in mind for day-to-day life.  Mainstays of therapy for aneurysms include very good blood pressure control, healthy lifestyle, and avoiding tobacco products and street drugs. Research has raised concern that antibiotics in the  fluoroquinolone class could be associated with increased risk of having an aneurysm develop or tear. This includes medicines that end in "floxacin," like Cipro or Levaquin. Make sure to discuss this information with other healthcare providers if you require antibiotics.  Since aneurysms can run in families, you should discuss your diagnosis with first degree relatives as they may need to be screened for this. Regular mild-moderate physical exercise is important, but avoid heavy lifting/weight lifting over 30lbs, chopping wood, shoveling snow or digging heavy earth with a shovel. It is best to avoid activities that cause grunting or straining (medically referred to as a "Valsalva maneuver"). This happens when a person bears down against a closed throat to increase the strength of arm or abdominal muscles. There's often a tendency to do this when lifting heavy weights, doing sit-ups, push-ups or chin-ups, etc., but it may be harmful.  This is a finding I would expect to be monitored periodically by your cardiology team. Most unruptured thoracic aortic aneurysms cause no symptoms, so they are often found during exams for other conditions. Contact a health care provider if you develop any discomfort in your upper back, neck, abdomen, trouble swallowing, cough or hoarseness, or unexplained weight loss. Get help right away if you develop severe pain in your upper back or abdomen that may move into your chest and arms, or any other concerning symptoms such as shortness of breath or fever.

## 2023-01-29 NOTE — Progress Notes (Signed)
Cardiology Office Note:  .   Date:  01/29/2023  ID:  John [name], DOB [birth year], MRN [id number] PCP: [name], MD  [facility] Providers Cardiologist:  Jodelle Red, MD    History of Present Illness: .   John [name] is a 67 y.o. male with hx of PAF, HTN, HLD, Morton's neuroma, bronchitis, asthma, COPD, GERD, OA, OSA on CPAP, pancreatitis, aortic dilation.  Established with Dr. Cristal Deer 04/25/22. Prior CT calcium score calcium score 41.7. Initial anterior certival discectomy and fusion 03/2022 cancelled due to hypotension on induction. Echo 03/31/22 normal LVEF 60 to 65%, gr1dd, mildly elevated PASP, ascending aorta 40mm. Cardiac CTA 05/01/22 calcium score 45.5 placing him in 45th percentile for age/gender with mild nonobstructive 1-24% stenosis in the LAD, ascending aorta 3.7cm. 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  Cannot take Aspirin due to IBS per his report.   Presents today for preoperative clearance for anterior cervical fusion with Dr. Maisie Fus. 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 Did have episode yesterday with BP 61/49 which was symptomatic with minor "feeling funny". Checking BP intermittently. His surgeon did inquire if he should hold some antihypertensives prior to surgery. Drinks predominantly soft drinks RC, Sundrop, 7Up. Encouraged to increase oral hydration. Notes prior episodes of PAF were when on prednisone but never on methylprednisolone.   ROS: Please see the history of present illness.    All other systems reviewed and are negative.   Studies Reviewed: Marland Kitchen   EKG Interpretation Date/Time:  Monday January 29 2023 14:13:14 EDT Ventricular Rate:  78 PR Interval:  136 QRS Duration:  80 QT Interval:  356 QTC Calculation: 405 R Axis:   66  Text Interpretation: Normal sinus rhythm Normal ECG Confirmed by Gillian Shields ([id number]) on 01/29/2023 2:16:32 PM     Cardiac Studies & Procedures       ECHOCARDIOGRAM  ECHOCARDIOGRAM COMPLETE 03/31/2022  Narrative ECHOCARDIOGRAM REPORT    Patient Name:   John [name] Everage Date of Exam: 03/31/2022 Medical Rec #:  [id number]    Height:       71.0 in Accession #:    [id number]   Weight:       210.5 lb Date of Birth:  [birth year]     BSA:          2.155 m Patient Age:    66 years     BP:           156/127 mmHg Patient Gender: M            HR:           84 bpm. Exam Location:  Inpatient  Procedure: 2D Echo, Cardiac Doppler and Color Doppler  Indications:    Primary hypertension [208212]  History:        Patient has prior history of Echocardiogram examinations, most recent 12/06/2016. Arrythmias:Atrial Fibrillation; Risk Factors:Dyslipidemia, Hypertension, Sleep Apnea and Former Smoker.  Sonographer:    Aron Baba Referring Phys: [id number] Bedelia Person   Sonographer Comments: Image acquisition challenging due to respiratory motion. Global longitudinal strain was attempted. IMPRESSIONS   1. Left ventricular ejection fraction, by estimation, is 60 to 65%. The left ventricle has normal function. The left ventricle has no regional wall motion abnormalities. Left ventricular diastolic parameters are consistent with Grade I diastolic dysfunction (impaired relaxation). 2. Right ventricular systolic function is normal. The right ventricular size is normal. There is mildly  elevated pulmonary artery systolic pressure. 3. No evidence of mitral valve regurgitation. 4. The aortic valve is tricuspid. There is mild calcification of the aortic valve. Aortic valve regurgitation is not visualized. 5. Aneurysm of the ascending aorta, measuring 40 mm. 6. The inferior vena cava is normal in size with greater than 50% respiratory variability, suggesting right atrial pressure of 3 mmHg.  Comparison(s): No prior Echocardiogram.  FINDINGS Left Ventricle: Left ventricular ejection fraction, by estimation, is 60 to  65%. The left ventricle has normal function. The left ventricle has no regional wall motion abnormalities. The left ventricular internal cavity size was normal in size. There is borderline left ventricular hypertrophy. Left ventricular diastolic parameters are consistent with Grade I diastolic dysfunction (impaired relaxation).  Right Ventricle: The right ventricular size is normal. Right ventricular systolic function is normal. There is mildly elevated pulmonary artery systolic pressure. The tricuspid regurgitant velocity is 2.72 m/s, and with an assumed right atrial pressure of 8 mmHg, the estimated right ventricular systolic pressure is 37.6 mmHg.  Left Atrium: Left atrial size was normal in size.  Right Atrium: Right atrial size was normal in size.  Pericardium: There is no evidence of pericardial effusion.  Mitral Valve: No evidence of mitral valve regurgitation.  Tricuspid Valve: Tricuspid valve regurgitation is trivial.  Aortic Valve: The aortic valve is tricuspid. There is mild calcification of the aortic valve. Aortic valve regurgitation is not visualized.  Pulmonic Valve: Pulmonic valve regurgitation is not visualized.  Aorta: There is an aneurysm involving the ascending aorta measuring 40 mm.  Venous: The inferior vena cava is normal in size with greater than 50% respiratory variability, suggesting right atrial pressure of 3 mmHg.  IAS/Shunts: No atrial level shunt detected by color flow Doppler.   LEFT VENTRICLE PLAX 2D LVIDd:         4.50 cm   Diastology LVIDs:         2.80 cm   LV e' medial:    7.72 cm/s LV PW:         1.10 cm   LV E/e' medial:  10.8 LV IVS:        0.80 cm   LV e' lateral:   5.87 cm/s LVOT diam:     1.90 cm   LV E/e' lateral: 14.2 LV SV:         69 LV SV Index:   32 LVOT Area:     2.84 cm   RIGHT VENTRICLE RV S prime:     16.30 cm/s TAPSE (M-mode): 2.1 cm  LEFT ATRIUM             Index        RIGHT ATRIUM           Index LA diam:        4.00  cm 1.86 cm/m   RA Area:     13.20 cm LA Vol (A2C):   52.3 ml 24.27 ml/m  RA Volume:   30.80 ml  14.29 ml/m LA Vol (A4C):   39.9 ml 18.52 ml/m LA Biplane Vol: 45.9 ml 21.30 ml/m AORTIC VALVE LVOT Vmax:   141.00 cm/s LVOT Vmean:  86.500 cm/s LVOT VTI:    0.242 m  AORTA Ao Root diam: 3.80 cm Ao Asc diam:  3.85 cm  MITRAL VALVE                TRICUSPID VALVE MV Area (PHT): 3.72 cm     TR Peak grad:   29.6 mmHg  MV Decel Time: 204 msec     TR Vmax:        272.00 cm/s MR Peak grad: 59.0 mmHg MR Vmax:      384.00 cm/s   SHUNTS MV E velocity: 83.30 cm/s   Systemic VTI:  0.24 m MV A velocity: 120.00 cm/s  Systemic Diam: 1.90 cm MV E/A ratio:  0.69  [place] signed by Carolan Clines Signature Date/Time: 03/31/2022/10:35:10 AM    Final     CT SCANS  CT CORONARY MORPH W/CTA COR W/SCORE 05/01/2022  Addendum 05/03/2022  7:43 AM ADDENDUM REPORT: 05/03/2022 07:40  EXAM: OVER-READ INTERPRETATION  CT CHEST  The following report is an over-read performed by radiologist Dr. Royal Piedra [facility] on 05/03/2022. This over-read does not include interpretation of cardiac or coronary anatomy or pathology. The coronary calcium score and cardiac CTA interpretation by the cardiologist is attached.  COMPARISON:  Coronary calcium score 02/13/2022.  FINDINGS: Atherosclerotic calcifications in the thoracic aorta. Within the visualized portions of the thorax there are no suspicious appearing pulmonary nodules or masses, there is no acute consolidative airspace disease, no pleural effusions, no pneumothorax and no lymphadenopathy. Visualized portions of the upper abdomen are unremarkable. There are no aggressive appearing lytic or blastic lesions noted in the visualized portions of the skeleton.  IMPRESSION: 1.  Aortic Atherosclerosis (ICD10-I70.0).   Electronically Signed By: Trudie Reed M.D. On: 05/03/2022 07:40  Narrative CLINICAL DATA:   Chest pain  EXAM: Cardiac CTA  MEDICATIONS: Sub lingual nitro. 4mg  x 2  TECHNIQUE: The patient was scanned on a Siemens 192 slice scanner. Gantry rotation speed was 250 msecs. Collimation was 0.6 mm. A 100 kV prospective scan was triggered in the ascending thoracic aorta at 35-75% of the R-R interval. Average HR during the scan was 60 bpm. The 3D data set was interpreted on a dedicated work station using MPR, MIP and VRT modes. A total of 80cc of contrast was used.  FINDINGS: Non-cardiac: See separate report from [facility].  No LA appendage thrombus. Pulmonary veins drain normally to the left atrium.  Ascending aorta mildly enlarged 3.7 cm.  Calcium Score: 45.5 Agatston units.  Coronary Arteries: Right dominant with no anomalies  LM: No plaque or stenosis.  LAD system: Mixed plaque proximal LAD, mild (1-24%) stenosis. Large D1 without significant disease.  Circumflex system: No plaque or stenosis.  RCA system: No plaque or stenosis.  IMPRESSION: 1. Coronary artery calcium score 45.5 Agatston units. This places the patient in the 45th percentile for age and gender, suggesting intermediate risk for future cardiac events.  2.  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  [employer] account executive  Electronically Signed: By: Marca Ancona M.D. On: 05/01/2022 18:33          Risk Assessment/Calculations:    CHA2DS2-VASc Score = 3   This indicates a 3.2% annual risk of stroke. The patient's score is based upon: CHF History: 0 HTN History: 1 Diabetes History: 0 Stroke History: 0 Vascular Disease History: 1 (nonobstructive CAD) Age Score: 1 Gender Score: 0            Physical Exam:   VS:  BP 104/76 (BP Location: Left Arm, Patient Position: Sitting, Cuff Size: Large)   Pulse 78   Ht 5\' 11"  (1.803 m)   Wt 203 lb 3.2 oz (92.2 kg)   SpO2 94%   BMI 28.34 kg/m    Wt Readings from Last 3 Encounters:  01/29/23 203 lb 3.2 oz (92.2 kg)  12/19/22 210 lb (95.3 kg)   11/16/22 209 lb (94.8 kg)    GEN: Well nourished, well developed in no acute distress NECK: No JVD; No carotid bruits CARDIAC: RRR, no murmurs, rubs, gallops RESPIRATORY:  Clear to auscultation without rales, wheezing or rhonchi  ABDOMEN: Soft, non-tender, non-distended EXTREMITIES:  No edema; No deformity   ASSESSMENT AND PLAN: .    Preop clearance - According to the Revised Cardiac Risk Index (RCRI), his Perioperative Risk of Major Cardiac Event is (%): 0.9. His Functional Capacity in METs is: 7.59 according to the Duke Activity Status Index (DASI). Per AHA/ACC guidelines, he is deemed acceptable risk for the planned procedure without additional cardiovascular testing. Will route to surgical team so they are aware.  Due to relative hypotension,  hold Olmesartan prior to surgery. Take Diltiazem and Coreg as scheduled.   HTN - Now with relative hypotension. He will monitor BP at home for 2 weeks and check in via MyChart. If persistent hypotension, consider reduce Olmesartan. Continue Diltiazem, Coreg.   Nonobstructive coronary artery disease / HLD, LDL goal <70 - Stable with no anginal symptoms. No indication for ischemic evaluation.  GDMT Coreg, Rosuvastatin. Previously did not tolerate ASA.   PAF - Has been in NSRfor some time. Continue Coreg, Diltiazem. CHA2DS2-VASc Score = 3 [CHF History: 0, HTN History: 1, Diabetes History: 0, Stroke History: 0, Vascular Disease History: 1 (nonobstructive CAD), Age Score: 1, Gender Score: 0].  Therefore, the patient's annual risk of stroke is 3.2 %. Discussed stroke risk at length. He is uninterested in additional medical therapy with anticoagulation and verbalizes understanding of stroke risk.  Aortic dilation - Echo 03/2022 ascending aorta 40mm. CT 04/2022 ascending aorta 3.7 cm. CT aorta 10/2022 ascending aorta 3.7 cm and aneurysmal dilation of prox descending thoracic aorta 3.8x3.4cm.  Continue optimal BP control and beta blocker. Recommend avoidance of  Fluoroquinolones. Repeat CT aorta 10/2023 can be coordinated at follow up.        Dispo: follow up in 6 mos  Signed, Alver Sorrow, NP

## 2023-02-05 ENCOUNTER — Other Ambulatory Visit: Payer: Self-pay | Admitting: Neurosurgery

## 2023-02-05 NOTE — Telephone Encounter (Signed)
Dr. Tonia Brooms has agreed to dismissal based on patient's behavior toward staff.   Per most recent [facility] msg from patient he is planning to seek care at another pulmonary office.   FYI only.  Thank you!

## 2023-02-05 NOTE — Telephone Encounter (Signed)
Patient is VERY RUDE AND DISRESPECTFUL to staff. When attempting to provide him with his requested samples, he demanded to receive 4 of the Trelegy. He had been provided with 2. He would not continue the conversation and walked out the door. MyChart message sent relaying sample policy.   PATIENT SHOULD BE CONSIDERED FOR DISMISSAL IF BEHAVIOR CONTINUES.

## 2023-02-13 ENCOUNTER — Other Ambulatory Visit: Payer: Self-pay

## 2023-02-13 ENCOUNTER — Encounter (HOSPITAL_BASED_OUTPATIENT_CLINIC_OR_DEPARTMENT_OTHER): Payer: Self-pay

## 2023-02-13 DIAGNOSIS — M542 Cervicalgia: Secondary | ICD-10-CM

## 2023-02-14 ENCOUNTER — Other Ambulatory Visit: Payer: Self-pay | Admitting: Family Medicine

## 2023-02-14 ENCOUNTER — Other Ambulatory Visit: Payer: Self-pay

## 2023-02-14 MED ORDER — METHYLPREDNISOLONE 4 MG PO TBPK: ORAL_TABLET | ORAL | 0 refills | Status: DC

## 2023-02-16 ENCOUNTER — Other Ambulatory Visit: Payer: Self-pay | Admitting: Neurosurgery

## 2023-02-16 DIAGNOSIS — M4712 Other spondylosis with myelopathy, cervical region: Secondary | ICD-10-CM | POA: Diagnosis not present

## 2023-02-18 ENCOUNTER — Other Ambulatory Visit: Payer: Self-pay | Admitting: Internal Medicine

## 2023-02-18 DIAGNOSIS — E785 Hyperlipidemia, unspecified: Secondary | ICD-10-CM

## 2023-02-19 DIAGNOSIS — K080 Exfoliation of teeth due to systemic causes: Secondary | ICD-10-CM | POA: Diagnosis not present

## 2023-02-19 NOTE — Discharge Instructions (Signed)

## 2023-02-20 ENCOUNTER — Ambulatory Visit
Admission: RE | Admit: 2023-02-20 | Discharge: 2023-02-20 | Disposition: A | Discharge status: Home / Self Care | Payer: Medicare Other | Source: Ambulatory Visit | Attending: Family Medicine | Admitting: Family Medicine

## 2023-02-20 DIAGNOSIS — M4722 Other spondylosis with radiculopathy, cervical region: Secondary | ICD-10-CM | POA: Diagnosis not present

## 2023-02-20 DIAGNOSIS — M542 Cervicalgia: Secondary | ICD-10-CM

## 2023-02-20 MED ORDER — TRIAMCINOLONE ACETONIDE 40 MG/ML IJ SUSP (RADIOLOGY)
60.0000 mg | Freq: Once | INTRAMUSCULAR | Status: AC
  Administered 2023-02-20: 60 mg via EPIDURAL

## 2023-02-20 MED ORDER — IOPAMIDOL (ISOVUE-M 300) INJECTION 61%
1.0000 mL | Freq: Once | INTRAMUSCULAR | Status: AC | PRN
  Administered 2023-02-20: 1 mL via EPIDURAL

## 2023-02-26 ENCOUNTER — Telehealth (HOSPITAL_BASED_OUTPATIENT_CLINIC_OR_DEPARTMENT_OTHER): Payer: Self-pay | Admitting: Pulmonary Disease

## 2023-02-26 NOTE — Telephone Encounter (Signed)
Patient is needing refill of his inhaler. He is requesting to go back to [facility] if possible. He is also requesting to not go through [facility] this time as well. Pharmacy is [facility] on [address]. Patient will be switching to [facility] provider but needs refill before then. Please call patient as he is extremely unhappy with how our office has handled his care in the past.

## 2023-02-26 NOTE — Telephone Encounter (Signed)
Patient added on that he DOES NOT want this sent until the beginning of the year. He will be out by then, but he is wanting his insurance to pay for it-which they will do starting 2025.

## 2023-02-26 NOTE — Telephone Encounter (Signed)
Scheduled for with RA on 05/10/23. Nothing further needed.

## 2023-03-05 ENCOUNTER — Encounter: Payer: Self-pay | Admitting: Podiatry

## 2023-03-05 ENCOUNTER — Ambulatory Visit: Payer: Medicare Other | Admitting: Podiatry

## 2023-03-05 DIAGNOSIS — R252 Cramp and spasm: Secondary | ICD-10-CM | POA: Diagnosis not present

## 2023-03-05 DIAGNOSIS — L603 Nail dystrophy: Secondary | ICD-10-CM | POA: Diagnosis not present

## 2023-03-05 NOTE — Telephone Encounter (Signed)
Per message Dr. Tonia Brooms agreed to dismissal of patient  John Igo, DO   02/05/23 12:08 PM  I agree. I have zero tolerance policy for people not being nice.  I am ok to dismiss  It has been well documented. Please let mistry review as well.  Thanks  BLI

## 2023-03-05 NOTE — Telephone Encounter (Signed)
We are unable to hold on to refill messages and send them at a certain date/time. Patient will need to contact pharmacy or our office when he is ready to have medication refilled.   Per chart Dr. Tonia Brooms has already agreed to dismiss this patient based on his repeated behavior toward office staff. Message sent to [facility].

## 2023-03-05 NOTE — Progress Notes (Signed)
Subjective: Chief Complaint  Patient presents with   Nail Problem    Hallux right - thick, discolored nail x years, been using compounding cream, but hasn't really helped a lot   New Patient (Initial Visit)    Est pt 03/2020   67 year old male presents the office today with concerns of his right big toenail still thickened discolored.  He states he contacted the pharmacy and there is a stronger medication we can use topically.  He also stated concerns of getting cramps to his feet at nighttime while laying in bed.  He does not get this from today.  Objective: AAO x3, NAD DP/PT pulses palpable bilaterally, CRT less than 3 seconds Right hallux nails hypertrophic, dystrophic with fungal debris present.  No edema, erythema or signs of infection.  No open lesions.  No other areas of discomfort. No pain with calf compression, swelling, warmth, erythema  Assessment: Onychodystrophy  Plan: -All treatment options discussed with the patient including all alternatives, risks, complications.  -I did debride the nail any complications or bleeding today.  Faxed new perception to [employer]. -Discussed [employer], creams to help with the cramping at night.  -Patient encouraged to call the office with any questions, concerns, change in symptoms.   Vivi Barrack DPM

## 2023-03-05 NOTE — Telephone Encounter (Signed)
I am just seeing this message as my Epic training has not yet included [employer].  Per chart review with Lajoyce Lauber, it appears pt is scheduled to be seen at the [facility] location in January of 2025 with Dr. Myrlene Broker okay.  I did not receive a message from the pt as stated on 10/17, but I am willing to talk with the pt as needed.

## 2023-03-16 ENCOUNTER — Other Ambulatory Visit: Payer: Self-pay | Admitting: Internal Medicine

## 2023-03-16 DIAGNOSIS — I10 Essential (primary) hypertension: Secondary | ICD-10-CM

## 2023-03-16 NOTE — Progress Notes (Signed)
Surgical Instructions   Your procedure is scheduled on Thursday March 29, 2023. Report to [facility] Main Entrance "A" at 5:30 A.M., then check in with the Admitting office. Any questions or running late day of surgery: call [phone]  Questions prior to your surgery date: call [phone], Monday-Friday, 8am-4pm. If you experience any cold or flu symptoms such as cough, fever, chills, shortness of breath, etc. between now and your scheduled surgery, please notify us at the above number.     Remember:  Do not eat or drink after midnight the night before your surgery  Take these medicines the morning of surgery with A SIP OF WATER  carvedilol (COREG)  diltiazem (CARDIZEM CD)  fluticasone (FLONASE)  Fluticasone-Umeclidin-Vilant (TRELEGY ELLIPTA)  ipratropium (ATROVENT) 0.06 % nasal spray  montelukast (SINGULAIR)  rosuvastatin (CRESTOR)   May take these medicines IF NEEDED: traMADol (ULTRAM)    One week prior to surgery, STOP taking any Aspirin (unless otherwise instructed by your surgeon) Aleve, Naproxen, Ibuprofen, Motrin, Advil, Goody's, BC's, all herbal medications, fish oil, and non-prescription vitamins.                     Do NOT Smoke (Tobacco/Vaping) for 24 hours prior to your procedure.  If you use a CPAP at night, you may bring your mask/headgear for your overnight stay.   You will be asked to remove any contacts, glasses, piercing's, hearing aid's, dentures/partials prior to surgery. Please bring cases for these items if needed.    Patients discharged the day of surgery will not be allowed to drive home, and someone needs to stay with them for 24 hours.  SURGICAL WAITING ROOM VISITATION Patients may have no more than 2 support people in the waiting area - these visitors may rotate.   Pre-op nurse will coordinate an appropriate time for 1 ADULT support person, who may not rotate, to accompany patient in pre-op.  Children under the age of 20 must have an adult  with them who is not the patient and must remain in the main waiting area with an adult.  If the patient needs to stay at the hospital during part of their recovery, the visitor guidelines for inpatient rooms apply.  Please refer to the [facility] website for the visitor guidelines for any additional information.   If you received a COVID test during your pre-op visit  it is requested that you wear a mask when out in public, stay away from anyone that may not be feeling well and notify your surgeon if you develop symptoms. If you have been in contact with anyone that has tested positive in the last 10 days please notify you surgeon.      Pre-operative 5 CHG Bathing Instructions   You can play a key role in reducing the risk of infection after surgery. Your skin needs to be as free of germs as possible. You can reduce the number of germs on your skin by washing with CHG (chlorhexidine gluconate) soap before surgery. CHG is an antiseptic soap that kills germs and continues to kill germs even after washing.   DO NOT use if you have an allergy to chlorhexidine/CHG or antibacterial soaps. If your skin becomes reddened or irritated, stop using the CHG and notify one of our RNs at [phone].   Please shower with the CHG soap starting 4 days before surgery using the following schedule:     Please keep in mind the following:  DO NOT shave, including legs and  underarms, starting the day of your first shower.   You may shave your face at any point before/day of surgery.  Place clean sheets on your bed the day you start using CHG soap. Use a clean washcloth (not used since being washed) for each shower. DO NOT sleep with pets once you start using the CHG.   CHG Shower Instructions:  Wash your face and private area with normal soap. If you choose to wash your hair, wash first with your normal shampoo.  After you use shampoo/soap, rinse your hair and body thoroughly to remove shampoo/soap  residue.  Turn the water OFF and apply about 3 tablespoons (45 ml) of CHG soap to a CLEAN washcloth.  Apply CHG soap ONLY FROM YOUR NECK DOWN TO YOUR TOES (washing for 3-5 minutes)  DO NOT use CHG soap on face, private areas, open wounds, or sores.  Pay special attention to the area where your surgery is being performed.  If you are having back surgery, having someone wash your back for you may be helpful. Wait 2 minutes after CHG soap is applied, then you may rinse off the CHG soap.  Pat dry with a clean towel  Put on clean clothes/pajamas   If you choose to wear lotion, please use ONLY the CHG-compatible lotions on the back of this paper.   Additional instructions for the day of surgery: DO NOT APPLY any lotions, deodorants or cologne.    Do not bring valuables to the hospital. [facility] is not responsible for any belongings/valuables. Do not wear jewelry  Put on clean/comfortable clothes.  Please brush your teeth.  Ask your nurse before applying any prescription medications to the skin.     CHG Compatible Lotions   Aveeno Moisturizing lotion  Cetaphil Moisturizing Cream  Cetaphil Moisturizing Lotion  Clairol Herbal Essence Moisturizing Lotion, Dry Skin  Clairol Herbal Essence Moisturizing Lotion, Extra Dry Skin  Clairol Herbal Essence Moisturizing Lotion, Normal Skin  Curel Age Defying Therapeutic Moisturizing Lotion with Alpha Hydroxy  Curel Extreme Care Body Lotion  Curel Soothing Hands Moisturizing Hand Lotion  Curel Therapeutic Moisturizing Cream, Fragrance-Free  Curel Therapeutic Moisturizing Lotion, Fragrance-Free  Curel Therapeutic Moisturizing Lotion, Original Formula  Eucerin Daily Replenishing Lotion  Eucerin Dry Skin Therapy Plus Alpha Hydroxy Crme  Eucerin Dry Skin Therapy Plus Alpha Hydroxy Lotion  Eucerin Original Crme  Eucerin Original Lotion  Eucerin Plus Crme Eucerin Plus Lotion  Eucerin TriLipid Replenishing Lotion  Keri Anti-Bacterial Hand  Lotion  Keri Deep Conditioning Original Lotion Dry Skin Formula Softly Scented  Keri Deep Conditioning Original Lotion, Fragrance Free Sensitive Skin Formula  Keri Lotion Fast Absorbing Fragrance Free Sensitive Skin Formula  Keri Lotion Fast Absorbing Softly Scented Dry Skin Formula  Keri Original Lotion  Keri Skin Renewal Lotion Keri Silky Smooth Lotion  Keri Silky Smooth Sensitive Skin Lotion  Nivea Body Creamy Conditioning Oil  Nivea Body Extra Enriched Lotion  Nivea Body Original Lotion  Nivea Body Sheer Moisturizing Lotion Nivea Crme  Nivea Skin Firming Lotion  NutraDerm 30 Skin Lotion  NutraDerm Skin Lotion  NutraDerm Therapeutic Skin Cream  NutraDerm Therapeutic Skin Lotion  ProShield Protective Hand Cream  Provon moisturizing lotion  Please read over the following fact sheets that you were given.

## 2023-03-19 ENCOUNTER — Encounter (HOSPITAL_COMMUNITY): Payer: Self-pay

## 2023-03-19 ENCOUNTER — Encounter (HOSPITAL_COMMUNITY)
Admission: RE | Admit: 2023-03-19 | Discharge: 2023-03-19 | Disposition: A | Discharge status: Home / Self Care | Payer: Medicare Other | Source: Ambulatory Visit | Attending: Neurosurgery | Admitting: Neurosurgery

## 2023-03-19 ENCOUNTER — Other Ambulatory Visit: Payer: Self-pay

## 2023-03-19 VITALS — BP 148/90 | HR 93 | Temp 98.7°F | Resp 18 | Ht 71.0 in | Wt 211.1 lb

## 2023-03-19 DIAGNOSIS — I2510 Atherosclerotic heart disease of native coronary artery without angina pectoris: Secondary | ICD-10-CM | POA: Diagnosis not present

## 2023-03-19 DIAGNOSIS — J439 Emphysema, unspecified: Secondary | ICD-10-CM | POA: Insufficient documentation

## 2023-03-19 DIAGNOSIS — I7121 Aneurysm of the ascending aorta, without rupture: Secondary | ICD-10-CM | POA: Diagnosis not present

## 2023-03-19 DIAGNOSIS — G4733 Obstructive sleep apnea (adult) (pediatric): Secondary | ICD-10-CM | POA: Insufficient documentation

## 2023-03-19 DIAGNOSIS — Z87891 Personal history of nicotine dependence: Secondary | ICD-10-CM | POA: Diagnosis not present

## 2023-03-19 DIAGNOSIS — Z01812 Encounter for preprocedural laboratory examination: Secondary | ICD-10-CM | POA: Insufficient documentation

## 2023-03-19 DIAGNOSIS — Z01818 Encounter for other preprocedural examination: Secondary | ICD-10-CM

## 2023-03-19 HISTORY — DX: Personal history of urinary calculi: Z87.442

## 2023-03-19 HISTORY — DX: Other complications of anesthesia, initial encounter: T88.59XA

## 2023-03-19 LAB — BASIC METABOLIC PANEL
Anion gap: 8 (ref 5–15)
BUN: 20 mg/dL (ref 8–23)
CO2: 25 mmol/L (ref 22–32)
Calcium: 9.2 mg/dL (ref 8.9–10.3)
Chloride: 105 mmol/L (ref 98–111)
Creatinine, Ser: 1.12 mg/dL (ref 0.61–1.24)
GFR, Estimated: >60 mL/min (ref >60)
Glucose, Bld: 134 mg/dL — ABNORMAL HIGH (ref 70–99)
Potassium: 4.3 mmol/L (ref 3.5–5.1)
Sodium: 138 mmol/L (ref 135–145)

## 2023-03-19 LAB — SURGICAL PCR SCREEN
MRSA, PCR: NEGATIVE
Staphylococcus aureus: NEGATIVE

## 2023-03-19 LAB — CBC
HCT: 41 % (ref 39.0–52.0)
Hemoglobin: 13.6 g/dL (ref 13.0–17.0)
MCH: 31.5 pg (ref 26.0–34.0)
MCHC: 33.2 g/dL (ref 30.0–36.0)
MCV: 94.9 fL (ref 80.0–100.0)
Platelets: 140 10*3/uL — ABNORMAL LOW (ref 150–400)
RBC: 4.32 MIL/uL (ref 4.22–5.81)
RDW: 13.9 % (ref 11.5–15.5)
WBC: 5.1 10*3/uL (ref 4.0–10.5)
nRBC: 0 % (ref 0.0–0.2)

## 2023-03-19 LAB — TYPE AND SCREEN
ABO/RH(D): O NEG
Antibody Screen: NEGATIVE

## 2023-03-19 NOTE — Progress Notes (Signed)
PCP - Dr Sanda Linger Cardiologist - Dr Jodelle Red  PPM/ICD - denies   Chest x-ray - N/A EKG - 01/29/23 Stress Test - denies ECHO - 03/31/22 Cardiac Cath - denies  Sleep Study - pt has OSA but does not wear CPAP  Fasting Blood Sugar - N/A  Last dose of GLP1 agonist-  N/A  Blood Thinner Instructions: N/A Aspirin Instructions: N/A  ERAS Protcol - NPO order   COVID TEST- N/A   Anesthesia review: cardiac clearance in epic. Pt with history of "BP dropping" during last attempt of ACDF surgery at surgery center per patient. Pt states he was re-scheduled to have surgery at [facility] instead d/t this. Pt was instructed by MD to not take Olmesartan DOS, but Carvedilol and Diltiazem OK per pt report. Pt denies low BP, dizziness, or cardiac symptoms since seeing cardiology.   Patient denies shortness of breath, fever, cough and chest pain at PAT appointment   All instructions explained to the patient, with a verbal understanding of the material. Patient agrees to go over the instructions while at home for a better understanding. The opportunity to ask questions was provided.

## 2023-03-20 NOTE — Progress Notes (Signed)
Anesthesia Chart Review:  67 yo male, originally scheduled for ACDF C5-6 at [facility] 03/28/22 which was aborted due to hypotension on induction (copy of anesthesia records from this encounter on pt chart). Dr. Maisie Fus moved the case to [facility] and ordered preop echocardiogram on the morning of surgery 03/31/2022. Per anesthesiology, it was not felt to be safe to proceed with general anesthesia without a stress test given his history of chest pain, comorbidities, and prior reaction to induction. Echo was completed 03/31/22 which showed LVEF 60-65% with grade 1 diastolic dysfunction. Ascending aortic aneurysm measured at 40 mm.   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 Surgery was postponed by the patient due to work schedule. He was seen in followup by Tyler Aas, NP on 01/29/23 for preop eval. Per note, "Preop clearance - According to the Revised Cardiac Risk Index (RCRI), his Perioperative Risk of Major Cardiac Event is (%): 0.9. His Functional Capacity in METs is: 7.59 according to the Duke Activity Status Index (DASI). Per AHA/ACC guidelines, he is deemed acceptable risk for the planned procedure without additional cardiovascular testing. Will route to surgical team so they are aware.  Due to relative hypotension,  hold Olmesartan prior to surgery. Take Diltiazem and Coreg as scheduled." He also has a hx of paroxysmal afib, not on AC. Has been in NSR for some time.   Other pertinent hx includes OSA not on CPAP, GERD, asthma, former smoker with associated emphysema.   Preop labs reviewed, mild thrombocytopenia platelets 140k, otherwise unremarkable.   EKG 01/29/23: NSR. Rate 78.  Coronary CTA 05/01/22: IMPRESSION: 1. Coronary artery calcium score 45.5 Agatston units. This places the patient in the 45th percentile for age and gender, suggesting intermediate risk for future cardiac events.   2.   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  TTE 03/31/22: 1. Left ventricular ejection fraction, by estimation, is 60 to 65%. The  left ventricle has normal function. The left ventricle has no regional  wall motion abnormalities. Left ventricular diastolic parameters are  consistent with Grade I diastolic  dysfunction (impaired relaxation).   2. Right ventricular systolic function is normal. The right ventricular  size is normal. There is mildly elevated pulmonary artery systolic  pressure.   3. No evidence of mitral valve regurgitation.   4. The aortic valve is tricuspid. There is mild calcification of the  aortic valve. Aortic valve regurgitation is not visualized.   5. Aneurysm of the ascending aorta, measuring 40 mm.   6. The inferior vena cava is normal in size with greater than 50%  respiratory variability, suggesting right atrial pressure of 3 mmHg.    Zannie Cove [facility] Stay [facility]/Anesthesiology Phone [phone] 03/20/2023 3:39 PM

## 2023-03-20 NOTE — Anesthesia Preprocedure Evaluation (Addendum)
Anesthesia Evaluation  Patient identified by MRN, date of birth, ID band Patient awake    Reviewed: Allergy & Precautions, NPO status , Patient's Chart, lab work & pertinent test results  Airway Mallampati: III  TM Distance: <3 FB Neck ROM: Full    Dental  (+) Teeth Intact, Dental Advisory Given   Pulmonary asthma , sleep apnea and Continuous Positive Airway Pressure Ventilation , COPD, Patient abstained from smoking., former smoker   breath sounds clear to auscultation       Cardiovascular hypertension, Pt. on home beta blockers and Pt. on medications + dysrhythmias Atrial Fibrillation  Rhythm:Regular Rate:Normal     Neuro/Psych  PSYCHIATRIC DISORDERS Anxiety Depression     Neuromuscular disease    GI/Hepatic Neg liver ROS,GERD  ,,  Endo/Other  negative endocrine ROS    Renal/GU negative Renal ROS     Musculoskeletal  (+) Arthritis ,    Abdominal   Peds  Hematology   Anesthesia Other Findings   Reproductive/Obstetrics                             Anesthesia Physical Anesthesia Plan  ASA: 3  Anesthesia Plan: General   Post-op Pain Management: Tylenol PO (pre-op)*   Induction: Intravenous  PONV Risk Score and Plan: 3 and Ondansetron, Dexamethasone and Midazolam  Airway Management Planned: Oral ETT and Video Laryngoscope Planned  Additional Equipment: None  Intra-op Plan:   Post-operative Plan: Extubation in OR  Informed Consent: I have reviewed the patients History and Physical, chart, labs and discussed the procedure including the risks, benefits and alternatives for the proposed anesthesia with the patient or authorized representative who has indicated his/her understanding and acceptance.     Dental advisory given  Plan Discussed with: CRNA  Anesthesia Plan Comments: (PAT note by Antionette Poles, PA-C: 67 yo male, originally scheduled for ACDF C5-6 at [facility] 03/28/22 which was aborted due to hypotension on induction (copy of anesthesia records from this encounter on pt chart). Dr. Maisie Fus moved the case to [facility] and ordered preop echocardiogram on the morning of surgery 03/31/2022. Per anesthesiology, it was not felt to be safe to proceed with general anesthesia without a stress test given his history of chest pain, comorbidities, and prior reaction to induction. Echo was completed 03/31/22 which showed LVEF 60-65% with grade 1 diastolic dysfunction. Ascending aortic aneurysm measured at 40 mm.   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 Surgery was postponed by the patient due to work schedule. He was seen in followup by Tyler Aas, NP on 01/29/23 for preop eval. Per note, "Preop clearance - According to the Revised Cardiac Risk Index (RCRI), his Perioperative Risk of Major Cardiac Event is (%): 0.9. His Functional Capacity in METs is: 7.59 according to the Duke Activity Status Index (DASI). Per AHA/ACC guidelines, he is deemed acceptable risk for the planned procedure without additional cardiovascular testing. Will route to surgical team so they are aware.  Due to relative hypotension,  hold Olmesartan prior to surgery. Take Diltiazem and Coreg as scheduled." He also has a hx of paroxysmal afib, not on AC. Has been in NSR for some time.   Other pertinent hx includes OSA not on CPAP, GERD, asthma, former smoker with associated emphysema.   Preop labs reviewed, mild thrombocytopenia platelets 140k, otherwise unremarkable.   EKG 01/29/23: NSR. Rate 78.  Coronary  CTA 05/01/22: IMPRESSION: 1. Coronary artery calcium score 45.5 Agatston units. This places the patient in the 45th percentile for age and gender, suggesting intermediate risk for future cardiac events.  2.  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  TTE 03/31/22: 1. Left ventricular ejection fraction, by  estimation, is 60 to 65%. The  left ventricle has normal function. The left ventricle has no regional  wall motion abnormalities. Left ventricular diastolic parameters are  consistent with Grade I diastolic  dysfunction (impaired relaxation).  2. Right ventricular systolic function is normal. The right ventricular  size is normal. There is mildly elevated pulmonary artery systolic  pressure.  3. No evidence of mitral valve regurgitation.  4. The aortic valve is tricuspid. There is mild calcification of the  aortic valve. Aortic valve regurgitation is not visualized.  5. Aneurysm of the ascending aorta, measuring 40 mm.  6. The inferior vena cava is normal in size with greater than 50%  respiratory variability, suggesting right atrial pressure of 3 mmHg.    )        Anesthesia Quick Evaluation

## 2023-03-21 ENCOUNTER — Other Ambulatory Visit: Payer: Self-pay | Admitting: Internal Medicine

## 2023-03-21 DIAGNOSIS — J309 Allergic rhinitis, unspecified: Secondary | ICD-10-CM

## 2023-03-27 ENCOUNTER — Encounter: Payer: Self-pay | Admitting: Family Medicine

## 2023-03-29 ENCOUNTER — Other Ambulatory Visit: Payer: Self-pay

## 2023-03-29 ENCOUNTER — Ambulatory Visit (HOSPITAL_BASED_OUTPATIENT_CLINIC_OR_DEPARTMENT_OTHER): Payer: Medicare Other | Admitting: Certified Registered Nurse Anesthetist

## 2023-03-29 ENCOUNTER — Ambulatory Visit (HOSPITAL_COMMUNITY): Payer: Medicare Other | Admitting: Physician Assistant

## 2023-03-29 ENCOUNTER — Ambulatory Visit (HOSPITAL_COMMUNITY): Payer: Medicare Other

## 2023-03-29 ENCOUNTER — Encounter (HOSPITAL_COMMUNITY): Payer: Self-pay

## 2023-03-29 ENCOUNTER — Ambulatory Visit (HOSPITAL_COMMUNITY)
Admission: RE | Admit: 2023-03-29 | Discharge: 2023-03-29 | Disposition: A | Discharge status: Home / Self Care | Payer: Medicare Other | Attending: Neurosurgery | Admitting: Neurosurgery

## 2023-03-29 ENCOUNTER — Encounter (HOSPITAL_COMMUNITY)
Admission: RE | Disposition: A | Discharge status: Home / Self Care | Payer: Self-pay | Source: Home / Self Care | Attending: Neurosurgery

## 2023-03-29 DIAGNOSIS — M5412 Radiculopathy, cervical region: Secondary | ICD-10-CM | POA: Diagnosis not present

## 2023-03-29 DIAGNOSIS — J439 Emphysema, unspecified: Secondary | ICD-10-CM | POA: Diagnosis not present

## 2023-03-29 DIAGNOSIS — I4891 Unspecified atrial fibrillation: Secondary | ICD-10-CM | POA: Diagnosis not present

## 2023-03-29 DIAGNOSIS — J449 Chronic obstructive pulmonary disease, unspecified: Secondary | ICD-10-CM

## 2023-03-29 DIAGNOSIS — I10 Essential (primary) hypertension: Secondary | ICD-10-CM | POA: Diagnosis not present

## 2023-03-29 DIAGNOSIS — Z79899 Other long term (current) drug therapy: Secondary | ICD-10-CM | POA: Diagnosis not present

## 2023-03-29 DIAGNOSIS — K219 Gastro-esophageal reflux disease without esophagitis: Secondary | ICD-10-CM | POA: Diagnosis not present

## 2023-03-29 DIAGNOSIS — M4802 Spinal stenosis, cervical region: Secondary | ICD-10-CM | POA: Insufficient documentation

## 2023-03-29 DIAGNOSIS — I480 Paroxysmal atrial fibrillation: Secondary | ICD-10-CM | POA: Diagnosis not present

## 2023-03-29 DIAGNOSIS — Z87891 Personal history of nicotine dependence: Secondary | ICD-10-CM | POA: Insufficient documentation

## 2023-03-29 DIAGNOSIS — G4733 Obstructive sleep apnea (adult) (pediatric): Secondary | ICD-10-CM | POA: Insufficient documentation

## 2023-03-29 DIAGNOSIS — Z981 Arthrodesis status: Secondary | ICD-10-CM | POA: Diagnosis not present

## 2023-03-29 HISTORY — PX: ANTERIOR CERVICAL DECOMP/DISCECTOMY FUSION: SHX1161

## 2023-03-29 LAB — ABO/RH: ABO/RH(D): O NEG

## 2023-03-29 SURGERY — ANTERIOR CERVICAL DECOMPRESSION/DISCECTOMY FUSION 1 LEVEL
Anesthesia: General | Site: Spine Cervical

## 2023-03-29 MED ORDER — DEXAMETHASONE SODIUM PHOSPHATE 10 MG/ML IJ SOLN
INTRAMUSCULAR | Status: DC | PRN
  Administered 2023-03-29: 10 mg via INTRAVENOUS

## 2023-03-29 MED ORDER — ACETAMINOPHEN 10 MG/ML IV SOLN
1000.0000 mg | Freq: Once | INTRAVENOUS | Status: DC | PRN
Start: 2023-03-29 — End: 2023-03-30

## 2023-03-29 MED ORDER — PHENYLEPHRINE HCL (PRESSORS) 10 MG/ML IV SOLN
INTRAVENOUS | Status: AC
  Filled 2023-03-29: qty 1

## 2023-03-29 MED ORDER — EPHEDRINE SULFATE-NACL 50-0.9 MG/10ML-% IV SOSY
PREFILLED_SYRINGE | INTRAVENOUS | Status: DC | PRN
  Administered 2023-03-29 (×3): 10 mg via INTRAVENOUS

## 2023-03-29 MED ORDER — PROPOFOL 10 MG/ML IV BOLUS
INTRAVENOUS | Status: DC | PRN
  Administered 2023-03-29: 100 mg via INTRAVENOUS

## 2023-03-29 MED ORDER — PROPOFOL 10 MG/ML IV BOLUS
INTRAVENOUS | Status: AC
  Filled 2023-03-29: qty 20

## 2023-03-29 MED ORDER — CEFAZOLIN SODIUM-DEXTROSE 2-4 GM/100ML-% IV SOLN
INTRAVENOUS | Status: AC
  Filled 2023-03-29: qty 100

## 2023-03-29 MED ORDER — SUGAMMADEX SODIUM 200 MG/2ML IV SOLN
INTRAVENOUS | Status: DC | PRN
  Administered 2023-03-29: 300 mg via INTRAVENOUS

## 2023-03-29 MED ORDER — LIDOCAINE-EPINEPHRINE 1 %-1:100000 IJ SOLN
INTRAMUSCULAR | Status: DC | PRN
  Administered 2023-03-29: 7 mL

## 2023-03-29 MED ORDER — CHLORHEXIDINE GLUCONATE CLOTH 2 % EX PADS: 6.0000 | MEDICATED_PAD | Freq: Once | CUTANEOUS | Status: DC

## 2023-03-29 MED ORDER — SUCCINYLCHOLINE CHLORIDE 200 MG/10ML IV SOSY
PREFILLED_SYRINGE | INTRAVENOUS | Status: DC | PRN
  Administered 2023-03-29: 140 mg via INTRAVENOUS

## 2023-03-29 MED ORDER — ACETAMINOPHEN 325 MG PO TABS
325.0000 mg | ORAL_TABLET | Freq: Once | ORAL | Status: DC | PRN
Start: 2023-03-29 — End: 2023-04-03

## 2023-03-29 MED ORDER — CHLORHEXIDINE GLUCONATE 0.12 % MT SOLN
OROMUCOSAL | Status: AC
  Administered 2023-03-29: 15 mL via OROMUCOSAL
  Filled 2023-03-29: qty 15

## 2023-03-29 MED ORDER — ALBUMIN HUMAN 5 % IV SOLN: INTRAVENOUS | Status: DC | PRN

## 2023-03-29 MED ORDER — OXYCODONE HCL 5 MG PO TABS
ORAL_TABLET | ORAL | Status: AC
  Filled 2023-03-29: qty 1

## 2023-03-29 MED ORDER — OXYCODONE-ACETAMINOPHEN 5-325 MG PO TABS: 1.0000 | ORAL_TABLET | ORAL | 0 refills | Status: DC | PRN

## 2023-03-29 MED ORDER — ACETAMINOPHEN 160 MG/5ML PO SOLN
325.0000 mg | Freq: Once | ORAL | Status: DC | PRN
Start: 2023-03-29 — End: 2023-04-03

## 2023-03-29 MED ORDER — MIDAZOLAM HCL 2 MG/2ML IJ SOLN
INTRAMUSCULAR | Status: AC
  Filled 2023-03-29: qty 2

## 2023-03-29 MED ORDER — HYDROMORPHONE HCL 1 MG/ML IJ SOLN
0.2500 mg | INTRAMUSCULAR | Status: DC | PRN
Start: 2023-03-29 — End: 2023-04-03

## 2023-03-29 MED ORDER — FENTANYL CITRATE (PF) 250 MCG/5ML IJ SOLN
INTRAMUSCULAR | Status: AC
  Filled 2023-03-29: qty 5

## 2023-03-29 MED ORDER — THROMBIN 5000 UNITS EX SOLR: OROMUCOSAL | Status: DC | PRN

## 2023-03-29 MED ORDER — CEFAZOLIN SODIUM-DEXTROSE 2-4 GM/100ML-% IV SOLN
2.0000 g | INTRAVENOUS | Status: AC
  Administered 2023-03-29: 2 g via INTRAVENOUS

## 2023-03-29 MED ORDER — FENTANYL CITRATE (PF) 250 MCG/5ML IJ SOLN
INTRAMUSCULAR | Status: DC | PRN
  Administered 2023-03-29: 50 ug via INTRAVENOUS
  Administered 2023-03-29: 150 ug via INTRAVENOUS
  Administered 2023-03-29: 50 ug via INTRAVENOUS

## 2023-03-29 MED ORDER — SODIUM CHLORIDE 0.9 % IV SOLN: INTRAVENOUS | Status: DC | PRN

## 2023-03-29 MED ORDER — OXYCODONE HCL 5 MG PO TABS
5.0000 mg | ORAL_TABLET | Freq: Once | ORAL | Status: AC
  Administered 2023-03-29: 5 mg via ORAL

## 2023-03-29 MED ORDER — MEPERIDINE HCL 25 MG/ML IJ SOLN
6.2500 mg | INTRAMUSCULAR | Status: DC | PRN
Start: 2023-03-29 — End: 2023-04-03

## 2023-03-29 MED ORDER — MIDAZOLAM HCL 2 MG/2ML IJ SOLN
INTRAMUSCULAR | Status: DC | PRN
  Administered 2023-03-29: 2 mg via INTRAVENOUS

## 2023-03-29 MED ORDER — 0.9 % SODIUM CHLORIDE (POUR BTL) OPTIME
TOPICAL | Status: DC | PRN
  Administered 2023-03-29: 1000 mL

## 2023-03-29 MED ORDER — HYDROMORPHONE HCL 1 MG/ML IJ SOLN
INTRAMUSCULAR | Status: AC
  Filled 2023-03-29: qty 0.5

## 2023-03-29 MED ORDER — ROCURONIUM BROMIDE 10 MG/ML (PF) SYRINGE
PREFILLED_SYRINGE | INTRAVENOUS | Status: DC | PRN
  Administered 2023-03-29 (×2): 50 mg via INTRAVENOUS

## 2023-03-29 MED ORDER — LACTATED RINGERS IV SOLN: INTRAVENOUS | Status: DC

## 2023-03-29 MED ORDER — DROPERIDOL 2.5 MG/ML IJ SOLN
0.6250 mg | Freq: Once | INTRAMUSCULAR | Status: DC | PRN
Start: 2023-03-29 — End: 2023-04-03

## 2023-03-29 MED ORDER — DOCUSATE SODIUM 100 MG PO CAPS: 100.0000 mg | ORAL_CAPSULE | Freq: Two times a day (BID) | ORAL | 0 refills | Status: DC

## 2023-03-29 MED ORDER — HYDROMORPHONE HCL 1 MG/ML IJ SOLN
INTRAMUSCULAR | Status: DC | PRN
  Administered 2023-03-29: .5 mg via INTRAVENOUS

## 2023-03-29 MED ORDER — PHENYLEPHRINE HCL-NACL 20-0.9 MG/250ML-% IV SOLN
INTRAVENOUS | Status: DC | PRN
  Administered 2023-03-29: 240 ug via INTRAVENOUS

## 2023-03-29 MED ORDER — PHENYLEPHRINE 80 MCG/ML (10ML) SYRINGE FOR IV PUSH (FOR BLOOD PRESSURE SUPPORT)
PREFILLED_SYRINGE | INTRAVENOUS | Status: DC | PRN
  Administered 2023-03-29 (×2): 80 ug via INTRAVENOUS
  Administered 2023-03-29: 160 ug via INTRAVENOUS
  Administered 2023-03-29: 80 ug via INTRAVENOUS
  Administered 2023-03-29: 100 ug via INTRAVENOUS

## 2023-03-29 MED ORDER — CHLORHEXIDINE GLUCONATE 0.12 % MT SOLN: 15.0000 mL | Freq: Once | OROMUCOSAL | Status: AC

## 2023-03-29 MED ORDER — ORAL CARE MOUTH RINSE: 15.0000 mL | Freq: Once | OROMUCOSAL | Status: AC

## 2023-03-29 MED ORDER — LIDOCAINE-EPINEPHRINE 1 %-1:100000 IJ SOLN
INTRAMUSCULAR | Status: AC
  Filled 2023-03-29: qty 1

## 2023-03-29 MED ORDER — ONDANSETRON HCL 4 MG/2ML IJ SOLN
INTRAMUSCULAR | Status: DC | PRN
  Administered 2023-03-29: 4 mg via INTRAVENOUS

## 2023-03-29 MED ORDER — THROMBIN 5000 UNITS EX SOLR
CUTANEOUS | Status: AC
  Filled 2023-03-29: qty 5000

## 2023-03-29 MED ORDER — CYCLOBENZAPRINE HCL 5 MG PO TABS: 5.0000 mg | ORAL_TABLET | Freq: Three times a day (TID) | ORAL | 0 refills | Status: DC | PRN

## 2023-03-29 SURGICAL SUPPLY — 59 items
BAG COUNTER SPONGE SURGICOUNT (BAG) ×1 IMPLANT
BASKET BONE COLLECTION (BASKET) IMPLANT
BENZOIN TINCTURE PRP APPL 2/3 (GAUZE/BANDAGES/DRESSINGS) ×1 IMPLANT
BIT DRILL 13 (BIT) IMPLANT
BIT DRILL NEURO 2X3.1 SFT TUCH (MISCELLANEOUS) ×1 IMPLANT
BLADE CLIPPER SURG (BLADE) IMPLANT
BUR MATCHSTICK NEURO 3.0 LAGG (BURR) ×1 IMPLANT
CANISTER SUCT 3000ML PPV (MISCELLANEOUS) ×1 IMPLANT
DEVICE ENDSKLTN IMPL 16X14X7X6 (Cage) IMPLANT
DRAPE C-ARM 42X72 X-RAY (DRAPES) ×2 IMPLANT
DRAPE LAPAROTOMY 100X72 PEDS (DRAPES) ×1 IMPLANT
DRAPE MICROSCOPE SLANT 54X150 (MISCELLANEOUS) ×1 IMPLANT
DRAPE SHEET LG 3/4 BI-LAMINATE (DRAPES) ×1 IMPLANT
DRESSING MEPILEX FLEX 4X4 (GAUZE/BANDAGES/DRESSINGS) IMPLANT
DRILL NEURO 2X3.1 SOFT TOUCH (MISCELLANEOUS) ×1
DRSG MEPILEX FLEX 4X4 (GAUZE/BANDAGES/DRESSINGS)
DRSG OPSITE 4X5.5 SM (GAUZE/BANDAGES/DRESSINGS) ×2 IMPLANT
DRSG OPSITE POSTOP 3X4 (GAUZE/BANDAGES/DRESSINGS) ×1 IMPLANT
DRSG OPSITE POSTOP 4X6 (GAUZE/BANDAGES/DRESSINGS) IMPLANT
DURAPREP 26ML APPLICATOR (WOUND CARE) ×1 IMPLANT
ELECT COATED BLADE 2.86 ST (ELECTRODE) ×1 IMPLANT
ELECT REM PT RETURN 9FT ADLT (ELECTROSURGICAL) ×1
ELECTRODE REM PT RTRN 9FT ADLT (ELECTROSURGICAL) ×1 IMPLANT
ENDOSKELETON IMPLANT 16X14X7X6 (Cage) ×1 IMPLANT
GAUZE 4X4 16PLY ~~LOC~~+RFID DBL (SPONGE) IMPLANT
GLOVE BIO SURGEON STRL SZ7 (GLOVE) ×2 IMPLANT
GLOVE BIOGEL PI IND STRL 7.5 (GLOVE) ×2 IMPLANT
GLOVE ECLIPSE 7.5 STRL STRAW (GLOVE) ×1 IMPLANT
GOWN STRL REUS W/ TWL LRG LVL3 (GOWN DISPOSABLE) ×2 IMPLANT
GOWN STRL REUS W/ TWL XL LVL3 (GOWN DISPOSABLE) ×2 IMPLANT
GOWN STRL REUS W/TWL 2XL LVL3 (GOWN DISPOSABLE) IMPLANT
HEMOSTAT POWDER KIT SURGIFOAM (HEMOSTASIS) ×1 IMPLANT
KIT BASIN OR (CUSTOM PROCEDURE TRAY) ×1 IMPLANT
KIT TURNOVER KIT B (KITS) ×1 IMPLANT
NDL HYPO 22X1.5 SAFETY MO (MISCELLANEOUS) ×1 IMPLANT
NDL SPNL 22GX3.5 QUINCKE BK (NEEDLE) ×1 IMPLANT
NEEDLE HYPO 22X1.5 SAFETY MO (MISCELLANEOUS) ×1 IMPLANT
NEEDLE SPNL 22GX3.5 QUINCKE BK (NEEDLE) ×1 IMPLANT
NS IRRIG 1000ML POUR BTL (IV SOLUTION) ×1 IMPLANT
PACK LAMINECTOMY NEURO (CUSTOM PROCEDURE TRAY) ×1 IMPLANT
PAD ARMBOARD 7.5X6 YLW CONV (MISCELLANEOUS) ×3 IMPLANT
PATTIES SURGICAL .5 X3 (DISPOSABLE) ×1 IMPLANT
PIN DISTRACTION 14MM (PIN) ×2 IMPLANT
PLATE ZEVO 1LVL 19MM (Plate) IMPLANT
PUTTY DBF 1CC CORTICAL FIBERS (Putty) IMPLANT
SCREW 3.5 SELFDRILL 15MM VARI (Screw) IMPLANT
SCREW VA SD 3.5X16 (Screw) IMPLANT
SPIKE FLUID TRANSFER (MISCELLANEOUS) ×1 IMPLANT
SPONGE INTESTINAL PEANUT (DISPOSABLE) ×1 IMPLANT
STAPLER VISISTAT 35W (STAPLE) IMPLANT
STRIP CLOSURE SKIN 1/2X4 (GAUZE/BANDAGES/DRESSINGS) ×1 IMPLANT
SUT MNCRL AB 4-0 PS2 18 (SUTURE) ×1 IMPLANT
SUT SILK 2 0 TIES 10X30 (SUTURE) IMPLANT
SUT VIC AB 3-0 SH 8-18 (SUTURE) ×1 IMPLANT
TAPE CLOTH 3X10 TAN LF (GAUZE/BANDAGES/DRESSINGS) ×1 IMPLANT
TIP KERRISON THIN FOOTPLATE 2M (MISCELLANEOUS) ×1 IMPLANT
TOWEL GREEN STERILE (TOWEL DISPOSABLE) ×1 IMPLANT
TOWEL GREEN STERILE FF (TOWEL DISPOSABLE) ×1 IMPLANT
WATER STERILE IRR 1000ML POUR (IV SOLUTION) ×1 IMPLANT

## 2023-03-29 NOTE — Anesthesia Postprocedure Evaluation (Signed)
Anesthesia Post Note  Patient: RAZI WAITE  Procedure(s) Performed: Anterior Cervical Decompression/Discectomy Fusion, Cervical Five-Cervical Six (Spine Cervical)     Patient location during evaluation: PACU Anesthesia Type: General Level of consciousness: awake and alert Pain management: pain level controlled Vital Signs Assessment: post-procedure vital signs reviewed and stable Respiratory status: spontaneous breathing, nonlabored ventilation, respiratory function stable and patient connected to nasal cannula oxygen Cardiovascular status: blood pressure returned to baseline and stable Postop Assessment: no apparent nausea or vomiting Anesthetic complications: no  No notable events documented.  Last Vitals:  Vitals:   03/29/23 1045 03/29/23 1100  BP: 133/75 128/87  Pulse: 84 79  Resp: 20 15  Temp:    SpO2: 97% 97%    Last Pain:  Vitals:   03/29/23 1100  TempSrc:   PainSc: 0-No pain                 Shelton Silvas

## 2023-03-29 NOTE — Transfer of Care (Signed)
Immediate Anesthesia Transfer of Care Note  Patient: John [name]  Procedure(s) Performed: Anterior Cervical Decompression/Discectomy Fusion, Cervical Five-Cervical Six (Spine Cervical)  Patient Location: PACU  Anesthesia Type:General  Level of Consciousness: awake, alert , and oriented  Airway & Oxygen Therapy: Patient Spontanous Breathing  Post-op Assessment: Report given to RN and Post -op Vital signs reviewed and stable  Post vital signs: Reviewed and stable  Last Vitals:  Vitals Value Taken Time  BP 137/76 03/29/23 1039  Temp    Pulse 88 03/29/23 1043  Resp 27 03/29/23 1043  SpO2 91 % 03/29/23 1043  Vitals shown include unfiled device data.  Last Pain:  Vitals:   03/29/23 0625  TempSrc:   PainSc: 0-No pain         Complications: No notable events documented.

## 2023-03-29 NOTE — Op Note (Signed)
PREOP DIAGNOSIS: Cervical radiculopathy  POSTOP DIAGNOSIS: Cervical radiculopathy   PROCEDURE: 1. Arthrodesis C5-6, anterior interbody technique, including Discectomy for decompression of spinal cord and exiting nerve roots with foraminotomies  2. Placement of intervertebral biomechanical device C5-6 3. Placement of anterior instrumentation consisting of interbody plate and screws C5-6 4. Use of morselized bone allograft  5. Use of intraoperative microscope  SURGEON: Dr. Hoyt Koch, MD  ASSISTANT: Patrici Ranks, PA.  Please note, no qualified trainees were available to assist with the procedure.  Assistance was required for retraction of the visceral structures to safely allow for instrumentation.  ANESTHESIA: General Endotracheal  EBL: 25 ml  IMPLANTS: Medtronic 7 mm Titan-C cage 19 mm Zevo plate 16 mm screws x 2 at C5 15 mm screws x 2 at C6 DBF  SPECIMENS: None  DRAINS: None  COMPLICATIONS: None immediate  CONDITION: Hemodynamically stable to PACU  HISTORY: John [name] is a 67 y.o. y.o. male who developed right C6 radiculopathy refractory to medical, physical and injection therapy.  He was found to have significant stenosis at C5-6.  Risks, benefits, alternatives, and expected convalescence were discussed with the patient.  Risks discussed included but were not limited to bleeding, pain, infection, dysphagia, dysphonia, pseudoarthrosis, hardware failure, adjacent segment disease, CSF leak, neurologic deficits, weakness, numbness, paralysis, coma, and death. After all questions were answered, informed consent was obtained.  PROCEDURE IN DETAIL: The patient was brought to the operating room and transferred to the operative table. After induction of general anesthesia, the patient was positioned on the operative table in the supine position with all pressure points meticulously padded. The skin of the neck was then prepped and draped in the usual sterile  fashion.  After timeout was conducted, the skin was infiltrated with local anesthetic. Skin incision was then made sharply and Bovie electrocautery was used to dissect the subcutaneous tissue until the platysma was identified. The platysma was then divided and undermined. The sternocleidomastoid muscle was then identified and, utilizing natural fascial planes in the neck, the prevertebral fascia was identified and the carotid sheath was retracted laterally and the trachea and esophagus retracted medially. Again using fluoroscopy, the C5-6 disc space was identified. Bovie electrocautery was used to dissect in the subperiosteal plane and elevate the bilateral longus coli muscles. Self-retaining retractors were then placed. Anterior osteophyte was removed.  Caspar distraction pins were placed in the adjacent bodies to allow for gentle distraction.  At this point, the microscope was draped and brought into the field, and the remainder of the case was done under the microscope using microdissecting technique.  The disc space was incised sharply and combination of high speed drill, curettes, and rongeurs were use to initially complete a discectomy. The high-speed drill was then used to complete discectomy including posterior osteophytectomy until the posterior annulus was identified and removed and the posterior longitudinal ligament was identified. Using a nerve hook, the PLL was elevated, and Kerrison rongeurs were used to remove the posterior longitudinal ligament and the ventral thecal sac was identified. Using a combination of curettes and rongeurs, complete decompression of the thecal sac and exiting nerve roots at this level was completed, and verified with easy passage of micro-nerve hook centrally and in the bilateral foramina.  Having completed our decompression, attention was turned to placement of the intervertebral device. Trial spacers were used to select a size 7 mm graft. This graft was then filled  with morcellized allograft, and inserted under live fluoroscopy.  After placement of the intervertebral  device, the caspar pins were removed.  An anterior cervical plate was placed across the interspaces for anterior fixation.  Using a high-speed drill, the cortex of the cervical vertebral bodies was punctured, and screws inserted in the vertebral bodies. Final fluoroscopic images in AP and lateral projections were taken to confirm good hardware placement.  At this point, after all counts were verified to be correct, meticulous hemostasis was secured using a combination of bipolar electrocautery and passive hemostatics. The platysma muscle was then closed using interrupted 3-0 Vicryl sutures, and the skin was closed with a 4-0 monocryl in subcutical fashion. Sterile dressings were then applied and the drapes removed.  The patient tolerated the procedure well and was extubated in the room and taken to the postanesthesia care unit in stable condition.  All counts were correct at the end of the procedure.

## 2023-03-29 NOTE — Progress Notes (Signed)
Orthopedic Tech Progress Note Patient Details:  John [name] [birth year] [id number]  Aspen collar is at bedside for application.  Ortho Devices Type of Ortho Device: Aspen cervical collar Ortho Device/Splint Location: at bedside in PACU 10 for RN to apply Ortho Device/Splint Interventions: Ordered      Docia Furl 03/29/2023, 11:16 AM

## 2023-03-29 NOTE — Anesthesia Procedure Notes (Signed)
Procedure Name: Intubation Date/Time: 03/29/2023 8:30 AM  Performed by: Hali Marry, CRNAPre-anesthesia Checklist: Patient identified, Emergency Drugs available, Suction available and Patient being monitored Patient Re-evaluated:Patient Re-evaluated prior to induction Oxygen Delivery Method: Circle system utilized Preoxygenation: Pre-oxygenation with 100% oxygen Induction Type: IV induction Ventilation: Mask ventilation without difficulty Laryngoscope Size: Glidescope and 4 Grade View: Grade I Tube type: Oral Tube size: 7.5 mm Number of attempts: 1 Airway Equipment and Method: Stylet and Oral airway Placement Confirmation: ETT inserted through vocal cords under direct vision, positive ETCO2 and breath sounds checked- equal and bilateral Secured at: 22 cm Tube secured with: Tape Dental Injury: Teeth and Oropharynx as per pre-operative assessment

## 2023-03-29 NOTE — Op Note (Signed)
CC: neck pain  HPI:     Patient is a 67 y.o. male presents with cervical radiculopathy affecting mostly the right arm.  He has tingilng in his thumb and forefinger.  His symptoms have been refractory to medical, physical and injection therapy.    Patient Active Problem List   Diagnosis Date Noted   Labral tear of long head of right biceps tendon 08/22/2022   Degenerative disc disease, lumbar 07/13/2022   Positive colorectal cancer screening using Cologuard test 06/23/2022   Myofascial pain 06/19/2022   Nerve pain due to spinal stenosis 06/19/2022   Chronic hyperglycemia 05/22/2022   Need for prophylactic vaccination and inoculation against varicella 05/22/2022   Need for vaccination 05/22/2022   Benign prostatic hyperplasia without lower urinary tract symptoms 05/22/2022   AC (acromioclavicular) joint arthritis 04/11/2022   OA (osteoarthritis)    Screen for colon cancer 05/16/2021   OSA (obstructive sleep apnea) 05/16/2021   Primary hypertension 11/25/2020   Hypogonadism, male 08/09/2020   Hyperlipidemia LDL goal <130 08/09/2020   Cervical disc disorder with radiculopathy of cervical region 03/09/2020   Chronic low back pain with sciatica 03/04/2018   COPD (chronic obstructive pulmonary disease) (HCC) 03/04/2018   Allergic rhinitis 03/04/2018   Anxiety 03/04/2018   Erectile dysfunction 03/04/2018   GERD (gastroesophageal reflux disease) 03/04/2018   PAF (paroxysmal atrial fibrillation) (HCC) 03/28/2017   Past Medical History:  Diagnosis Date   Afib (HCC)    no issues since 2018/2019   Anxiety and depression    Asthma    Complication of anesthesia    blood pressure dropped when trying to have ACDF at surgery center- aborted surgery and re-scheduled   Emphysema of lung (HCC)    GERD (gastroesophageal reflux disease)    Hernia    right inguinal   History of bronchitis    History of kidney stones    pt reports kidney stone on scan   Hyperlipidemia    Hypertension     Morton's neuroma    OA (osteoarthritis)    neck   Obstructive sleep apnea on CPAP    not using CPAP currently   Pancreatitis    Torn meniscus    Left    Past Surgical History:  Procedure Laterality Date   BONE CYST EXCISION  1991   left wrist   COLONOSCOPY     HERNIA REPAIR  04/14/11   right inguinal   HERNIA REPAIR     left inguinal, december 2020    Medications Prior to Admission  Medication Sig Dispense Refill Last Dose/Taking   carvedilol (COREG) 6.25 MG tablet Take 1 tablet (6.25 mg total) by mouth 2 (two) times daily with a meal. 180 tablet 1 03/29/2023 at  4:30 AM   CHELATED MAGNESIUM PO Take 200 mg by mouth in the morning.   03/28/2023   cholecalciferol (VITAMIN D3) 25 MCG (1000 UT) tablet Take 1,000 Units by mouth every evening.   03/28/2023   Coenzyme Q10 (COQ10) 200 MG CAPS Take 200 mg by mouth daily.   03/28/2023   diltiazem (CARDIZEM CD) 120 MG 24 hr capsule TAKE 1 CAPSULE(120 MG) BY MOUTH DAILY 90 capsule 0 03/28/2023 Bedtime   diltiazem (CARDIZEM) 30 MG tablet TAKE 1 TABLET BY MOUTH EVERY 4 HOURS AS NEEDED HR ABOVE 100 AND TOP NUMBER B/P GREATER THAN 100 30 tablet 1 03/28/2023 Bedtime   esomeprazole (NEXIUM) 20 MG capsule TAKE 1 CAPSULE (20 MG TOTAL) BY MOUTH DAILY AT 12 NOON. 90 capsule 0 03/28/2023  Ferrous Sulfate (IRON) 28 MG TABS Take 28 mg by mouth once a week.   Past Week   Flaxseed, Linseed, (FLAX SEED OIL) 1000 MG CAPS Take 1,000 mg by mouth daily.   Past Week   fluticasone (FLONASE) 50 MCG/ACT nasal spray Place 2 sprays into both nostrils in the morning and at bedtime. (Patient taking differently: Place 1 spray into both nostrils in the morning and at bedtime.) 48 g 0 03/28/2023   Fluticasone-Umeclidin-Vilant (TRELEGY ELLIPTA) 100-62.5-25 MCG/ACT AEPB Inhale 1 puff into the lungs daily. 1 each 12 Past Week   ipratropium (ATROVENT) 0.06 % nasal spray USE 1 SPRAY IN EACH NOSTRIL TWICE A DAY 45 mL 5 03/29/2023 at  4:30 AM   methylPREDNISolone (MEDROL DOSEPAK) 4  MG TBPK tablet Take by mouth. 4 day dosepack   Past Week   montelukast (SINGULAIR) 10 MG tablet TAKE 1 TABLET BY MOUTH EVERY DAY 90 tablet 0 03/29/2023 at  4:30 AM   Multiple Vitamins-Minerals (MULTIVITAMIN WITH MINERALS) tablet Take 1 tablet by mouth in the morning.   03/28/2023   naproxen (NAPROSYN) 500 MG tablet TAKE 1 TABLET BY MOUTH TWICE A DAY AS NEEDED 60 tablet 1 Past Week   olmesartan (BENICAR) 40 MG tablet TAKE 1 TABLET BY MOUTH EVERY DAY 90 tablet 0 03/28/2023   Omega-3 Fatty Acids (FISH OIL) 1000 MG CAPS Take 1,000 mg by mouth 2 (two) times daily.   Past Week   OVER THE COUNTER MEDICATION Take 1 tablet by mouth 2 (two) times daily. Prosta-Metto supplement   03/28/2023   OVER THE COUNTER MEDICATION Take 1 tablet by mouth every evening. Herba vision gold supplement   Past Week   POTASSIUM PO Take 50 mg by mouth daily.   03/28/2023   Prasterone, DHEA, (DHEA 50 PO) Take 100 mg by mouth daily.   Past Week   rosuvastatin (CRESTOR) 20 MG tablet TAKE 1 TABLET BY MOUTH EVERY DAY 90 tablet 0 03/28/2023   sildenafil (VIAGRA) 25 MG tablet Take 25 mg by mouth daily as needed for erectile dysfunction.   Past Week   simethicone (MYLICON) 125 MG chewable tablet Chew 125 mg by mouth every 6 (six) hours as needed for flatulence.   03/28/2023   traMADol (ULTRAM) 50 MG tablet Take 1 tablet (50 mg total) by mouth every 12 (twelve) hours as needed. 180 tablet 1 03/28/2023   TURMERIC CURCUMIN PO Take 800 mg by mouth 2 (two) times daily.   Past Week   vitamin B-12 (CYANOCOBALAMIN) 250 MCG tablet Take 250 mcg by mouth daily.   03/28/2023   vitamin E 180 MG (400 UNITS) capsule Take 400 Units by mouth daily.   Past Week   Allergies  Allergen Reactions   Nsaids Other (See Comments)    Upsets colon when taken in the evening time   Aprindine Other (See Comments)    Unknown reaction   Gabapentin Nausea And Vomiting and Palpitations    And severe anxiety and terrible nightmares     Social History   Tobacco  Use   Smoking status: Former    Current packs/day: 0.00    Average packs/day: 1.5 packs/day for 38.0 years (57.0 ttl pk-yrs)    Types: Cigarettes    Start date: 43    Quit date: 2015    Years since quitting: 9.9   Smokeless tobacco: Never  Substance Use Topics   Alcohol use: Not Currently    Family History  Problem Relation Age of Onset   Hypertension Mother  Dementia Other    Hypertension Sister    Hypertension Brother      Review of Systems Pertinent items are noted in HPI.  Objective:   Patient Vitals for the past 8 hrs:  BP Temp Temp src Pulse Resp SpO2 Height Weight  03/29/23 0551 137/79 98.9 F (37.2 C) Oral 87 18 94 % 5\' 11"  (1.803 m) 95.3 kg   No intake/output data recorded. No intake/output data recorded.      General : Alert, cooperative, no distress, appears stated age   Head:  Normocephalic/atraumatic    Eyes: PERRL, conjunctiva/corneas clear, EOM's intact. Fundi could not be visualized Neck: Supple Chest:  Respirations unlabored Chest wall: no tenderness or deformity Heart: Regular rate and rhythm Abdomen: Soft, nontender and nondistended Extremities: warm and well-perfused Skin: normal turgor, color and texture Neurologic:  Alert, oriented x 3.  Eyes open spontaneously. PERRL, EOMI, VFC, no facial droop. V1-3 intact.  No dysarthria, tongue protrusion symmetric.  CNII-XII intact. Normal strength, sensation and reflexes throughout.  No pronator drift, full strength in legs/  + Spurling's on right       Data ReviewCBC:  Lab Results  Component Value Date   WBC 5.1 03/19/2023   RBC 4.32 03/19/2023   BMP:  Lab Results  Component Value Date   GLUCOSE 134 (H) 03/19/2023   CO2 25 03/19/2023   BUN 20 03/19/2023   BUN 17 04/27/2022   CREATININE 1.12 03/19/2023   CREATININE 1.02 05/02/2019   CALCIUM 9.2 03/19/2023   Radiology review:  See clinic note  Assessment:   Active Problems:   * No active hospital problems. *  Cervical stenosis  with radiculopathy Plan:   - C5-6 ACDF today - Risks, benefits, alternatives, and expected convalescence were discussed with the patient.  Risks discussed included, but were not limited to bleeding, pain, infection, dysphagia, dysphonia, scar, spinal fluid leak, neurologic deficit, instability, pseudoarthrosis, adjacent segment disease, damage to nearby organs, and death.  Informed consent was obtained.

## 2023-03-30 ENCOUNTER — Encounter (HOSPITAL_COMMUNITY): Payer: Self-pay | Admitting: Neurosurgery

## 2023-04-01 ENCOUNTER — Other Ambulatory Visit: Payer: Self-pay | Admitting: Internal Medicine

## 2023-04-01 DIAGNOSIS — K219 Gastro-esophageal reflux disease without esophagitis: Secondary | ICD-10-CM

## 2023-04-01 NOTE — H&P (Signed)
 CC: neck pain  HPI:     Patient is a 67 y.o. male presents with cervical radiculopathy affecting mostly the right arm.  He has tingilng in his thumb and forefinger.  His symptoms have been refractory to medical, physical and injection therapy.    Patient Active Problem List   Diagnosis Date Noted   Labral tear of long head of right biceps tendon 08/22/2022   Degenerative disc disease, lumbar 07/13/2022   Positive colorectal cancer screening using Cologuard test 06/23/2022   Myofascial pain 06/19/2022   Nerve pain due to spinal stenosis 06/19/2022   Chronic hyperglycemia 05/22/2022   Need for prophylactic vaccination and inoculation against varicella 05/22/2022   Need for vaccination 05/22/2022   Benign prostatic hyperplasia without lower urinary tract symptoms 05/22/2022   AC (acromioclavicular) joint arthritis 04/11/2022   OA (osteoarthritis)    Screen for colon cancer 05/16/2021   OSA (obstructive sleep apnea) 05/16/2021   Primary hypertension 11/25/2020   Hypogonadism, male 08/09/2020   Hyperlipidemia LDL goal <130 08/09/2020   Cervical disc disorder with radiculopathy of cervical region 03/09/2020   Chronic low back pain with sciatica 03/04/2018   COPD (chronic obstructive pulmonary disease) (HCC) 03/04/2018   Allergic rhinitis 03/04/2018   Anxiety 03/04/2018   Erectile dysfunction 03/04/2018   GERD (gastroesophageal reflux disease) 03/04/2018   PAF (paroxysmal atrial fibrillation) (HCC) 03/28/2017   Past Medical History:  Diagnosis Date   Afib (HCC)    no issues since 2018/2019   Anxiety and depression    Asthma    Complication of anesthesia    blood pressure dropped when trying to have ACDF at surgery center- aborted surgery and re-scheduled   Emphysema of lung (HCC)    GERD (gastroesophageal reflux disease)    Hernia    right inguinal   History of bronchitis    History of kidney stones    pt reports kidney stone on scan   Hyperlipidemia    Hypertension     Morton's neuroma    OA (osteoarthritis)    neck   Obstructive sleep apnea on CPAP    not using CPAP currently   Pancreatitis    Torn meniscus    Left    Past Surgical History:  Procedure Laterality Date   BONE CYST EXCISION  1991   left wrist   COLONOSCOPY     HERNIA REPAIR  04/14/11   right inguinal   HERNIA REPAIR     left inguinal, december 2020    Medications Prior to Admission  Medication Sig Dispense Refill Last Dose/Taking   carvedilol (COREG) 6.25 MG tablet Take 1 tablet (6.25 mg total) by mouth 2 (two) times daily with a meal. 180 tablet 1 03/29/2023 at  4:30 AM   CHELATED MAGNESIUM PO Take 200 mg by mouth in the morning.   03/28/2023   cholecalciferol (VITAMIN D3) 25 MCG (1000 UT) tablet Take 1,000 Units by mouth every evening.   03/28/2023   Coenzyme Q10 (COQ10) 200 MG CAPS Take 200 mg by mouth daily.   03/28/2023   diltiazem (CARDIZEM CD) 120 MG 24 hr capsule TAKE 1 CAPSULE(120 MG) BY MOUTH DAILY 90 capsule 0 03/28/2023 Bedtime   diltiazem (CARDIZEM) 30 MG tablet TAKE 1 TABLET BY MOUTH EVERY 4 HOURS AS NEEDED HR ABOVE 100 AND TOP NUMBER B/P GREATER THAN 100 30 tablet 1 03/28/2023 Bedtime   esomeprazole (NEXIUM) 20 MG capsule TAKE 1 CAPSULE (20 MG TOTAL) BY MOUTH DAILY AT 12 NOON. 90 capsule 0 03/28/2023  Ferrous Sulfate (IRON) 28 MG TABS Take 28 mg by mouth once a week.   Past Week   Flaxseed, Linseed, (FLAX SEED OIL) 1000 MG CAPS Take 1,000 mg by mouth daily.   Past Week   fluticasone (FLONASE) 50 MCG/ACT nasal spray Place 2 sprays into both nostrils in the morning and at bedtime. (Patient taking differently: Place 1 spray into both nostrils in the morning and at bedtime.) 48 g 0 03/28/2023   Fluticasone-Umeclidin-Vilant (TRELEGY ELLIPTA) 100-62.5-25 MCG/ACT AEPB Inhale 1 puff into the lungs daily. 1 each 12 Past Week   ipratropium (ATROVENT) 0.06 % nasal spray USE 1 SPRAY IN EACH NOSTRIL TWICE A DAY 45 mL 5 03/29/2023 at  4:30 AM   methylPREDNISolone (MEDROL DOSEPAK) 4  MG TBPK tablet Take by mouth. 4 day dosepack   Past Week   montelukast (SINGULAIR) 10 MG tablet TAKE 1 TABLET BY MOUTH EVERY DAY 90 tablet 0 03/29/2023 at  4:30 AM   Multiple Vitamins-Minerals (MULTIVITAMIN WITH MINERALS) tablet Take 1 tablet by mouth in the morning.   03/28/2023   naproxen (NAPROSYN) 500 MG tablet TAKE 1 TABLET BY MOUTH TWICE A DAY AS NEEDED 60 tablet 1 Past Week   olmesartan (BENICAR) 40 MG tablet TAKE 1 TABLET BY MOUTH EVERY DAY 90 tablet 0 03/28/2023   Omega-3 Fatty Acids (FISH OIL) 1000 MG CAPS Take 1,000 mg by mouth 2 (two) times daily.   Past Week   OVER THE COUNTER MEDICATION Take 1 tablet by mouth 2 (two) times daily. Prosta-Metto supplement   03/28/2023   OVER THE COUNTER MEDICATION Take 1 tablet by mouth every evening. Herba vision gold supplement   Past Week   POTASSIUM PO Take 50 mg by mouth daily.   03/28/2023   Prasterone, DHEA, (DHEA 50 PO) Take 100 mg by mouth daily.   Past Week   rosuvastatin (CRESTOR) 20 MG tablet TAKE 1 TABLET BY MOUTH EVERY DAY 90 tablet 0 03/28/2023   sildenafil (VIAGRA) 25 MG tablet Take 25 mg by mouth daily as needed for erectile dysfunction.   Past Week   simethicone (MYLICON) 125 MG chewable tablet Chew 125 mg by mouth every 6 (six) hours as needed for flatulence.   03/28/2023   traMADol (ULTRAM) 50 MG tablet Take 1 tablet (50 mg total) by mouth every 12 (twelve) hours as needed. 180 tablet 1 03/28/2023   TURMERIC CURCUMIN PO Take 800 mg by mouth 2 (two) times daily.   Past Week   vitamin B-12 (CYANOCOBALAMIN) 250 MCG tablet Take 250 mcg by mouth daily.   03/28/2023   vitamin E 180 MG (400 UNITS) capsule Take 400 Units by mouth daily.   Past Week   Allergies  Allergen Reactions   Nsaids Other (See Comments)    Upsets colon when taken in the evening time   Aprindine Other (See Comments)    Unknown reaction   Gabapentin Nausea And Vomiting and Palpitations    And severe anxiety and terrible nightmares     Social History   Tobacco  Use   Smoking status: Former    Current packs/day: 0.00    Average packs/day: 1.5 packs/day for 38.0 years (57.0 ttl pk-yrs)    Types: Cigarettes    Start date: 43    Quit date: 2015    Years since quitting: 9.9   Smokeless tobacco: Never  Substance Use Topics   Alcohol use: Not Currently    Family History  Problem Relation Age of Onset   Hypertension Mother  Dementia Other    Hypertension Sister    Hypertension Brother      Review of Systems Pertinent items are noted in HPI.  Objective:   Patient Vitals for the past 8 hrs:  BP Temp Temp src Pulse Resp SpO2 Height Weight  03/29/23 0551 137/79 98.9 F (37.2 C) Oral 87 18 94 % 5\' 11"  (1.803 m) 95.3 kg   No intake/output data recorded. No intake/output data recorded.      General : Alert, cooperative, no distress, appears stated age   Head:  Normocephalic/atraumatic    Eyes: PERRL, conjunctiva/corneas clear, EOM's intact. Fundi could not be visualized Neck: Supple Chest:  Respirations unlabored Chest wall: no tenderness or deformity Heart: Regular rate and rhythm Abdomen: Soft, nontender and nondistended Extremities: warm and well-perfused Skin: normal turgor, color and texture Neurologic:  Alert, oriented x 3.  Eyes open spontaneously. PERRL, EOMI, VFC, no facial droop. V1-3 intact.  No dysarthria, tongue protrusion symmetric.  CNII-XII intact. Normal strength, sensation and reflexes throughout.  No pronator drift, full strength in legs/  + Spurling's on right       Data ReviewCBC:  Lab Results  Component Value Date   WBC 5.1 03/19/2023   RBC 4.32 03/19/2023   BMP:  Lab Results  Component Value Date   GLUCOSE 134 (H) 03/19/2023   CO2 25 03/19/2023   BUN 20 03/19/2023   BUN 17 04/27/2022   CREATININE 1.12 03/19/2023   CREATININE 1.02 05/02/2019   CALCIUM 9.2 03/19/2023   Radiology review:  See clinic note  Assessment:   Active Problems:   * No active hospital problems. *  Cervical stenosis  with radiculopathy Plan:   - C5-6 ACDF today - Risks, benefits, alternatives, and expected convalescence were discussed with the patient.  Risks discussed included, but were not limited to bleeding, pain, infection, dysphagia, dysphonia, scar, spinal fluid leak, neurologic deficit, instability, pseudoarthrosis, adjacent segment disease, damage to nearby organs, and death.  Informed consent was obtained.

## 2023-04-12 ENCOUNTER — Telehealth (HOSPITAL_BASED_OUTPATIENT_CLINIC_OR_DEPARTMENT_OTHER): Payer: Self-pay | Admitting: Pulmonary Disease

## 2023-04-12 NOTE — Telephone Encounter (Signed)
 Patient is needing a refill of Breo as he is running out. Patient is wondering if there is any equivalent script to [facility] and Trelegy as patient would rather not have medications from [facility]. Is this possible?   Due to the nature of the previous encounters patient has been dismissed by Dr. Brenna, but not the entire practice correct? Routing to manager to be aware of the situation.  Pharmacy:[facility] [address], patient recently had surgery and has no way of getting out but will have a driver tomorrow if it could be called in by tomorrow.  Please advise and call patient back.

## 2023-04-16 ENCOUNTER — Encounter (HOSPITAL_BASED_OUTPATIENT_CLINIC_OR_DEPARTMENT_OTHER): Payer: Self-pay | Admitting: Pulmonary Disease

## 2023-04-16 MED ORDER — BREZTRI AEROSPHERE 160-9-4.8 MCG/ACT IN AERO: 2.0000 | INHALATION_SPRAY | Freq: Two times a day (BID) | RESPIRATORY_TRACT | 0 refills | Status: DC

## 2023-04-16 MED ORDER — BUDESONIDE-FORMOTEROL FUMARATE 160-4.5 MCG/ACT IN AERO: 2.0000 | INHALATION_SPRAY | Freq: Two times a day (BID) | RESPIRATORY_TRACT | 0 refills | Status: DC

## 2023-04-16 NOTE — Telephone Encounter (Signed)
 I have researched patient's chart. It does not appear that patient was discharged from our practice.  Per 02/26/2023 phone note, Dr. Brenna approved for patient to be seen at [facility].  Okay for patient to keep scheduled appt with Dr. Jude. Dr. Alva has requested that all messages prior to 1/30 appt with him be directed to Dr. Brenna. I have informed all clinical staff of this.

## 2023-04-16 NOTE — Telephone Encounter (Signed)
 Message routed to [place] [facility] [place].

## 2023-04-24 DIAGNOSIS — M542 Cervicalgia: Secondary | ICD-10-CM | POA: Diagnosis not present

## 2023-05-02 DIAGNOSIS — M542 Cervicalgia: Secondary | ICD-10-CM | POA: Diagnosis not present

## 2023-05-04 DIAGNOSIS — M542 Cervicalgia: Secondary | ICD-10-CM | POA: Diagnosis not present

## 2023-05-08 DIAGNOSIS — M542 Cervicalgia: Secondary | ICD-10-CM | POA: Diagnosis not present

## 2023-05-10 ENCOUNTER — Encounter (HOSPITAL_BASED_OUTPATIENT_CLINIC_OR_DEPARTMENT_OTHER): Payer: Self-pay | Admitting: Pulmonary Disease

## 2023-05-10 ENCOUNTER — Ambulatory Visit (HOSPITAL_BASED_OUTPATIENT_CLINIC_OR_DEPARTMENT_OTHER): Payer: Medicare Other | Admitting: Pulmonary Disease

## 2023-05-10 VITALS — BP 130/80 | HR 89 | Ht 71.0 in | Wt 208.0 lb

## 2023-05-10 DIAGNOSIS — J4489 Other specified chronic obstructive pulmonary disease: Secondary | ICD-10-CM | POA: Diagnosis not present

## 2023-05-10 DIAGNOSIS — I7123 Aneurysm of the descending thoracic aorta, without rupture: Secondary | ICD-10-CM

## 2023-05-10 DIAGNOSIS — M542 Cervicalgia: Secondary | ICD-10-CM | POA: Diagnosis not present

## 2023-05-10 MED ORDER — BUDESONIDE-FORMOTEROL FUMARATE 160-4.5 MCG/ACT IN AERO: 2.0000 | INHALATION_SPRAY | Freq: Two times a day (BID) | RESPIRATORY_TRACT | 5 refills | Status: DC

## 2023-05-10 MED ORDER — ALBUTEROL SULFATE HFA 108 (90 BASE) MCG/ACT IN AERS: 2.0000 | INHALATION_SPRAY | RESPIRATORY_TRACT | 5 refills | Status: DC | PRN

## 2023-05-10 NOTE — Patient Instructions (Addendum)
Refills on symbicort & albuterol  Take singulair during spring & fall  RSV vaccine recommended

## 2023-05-10 NOTE — Addendum Note (Signed)
Addended byClyda Greener M on: 05/10/2023 12:01 PM   Modules accepted: Orders

## 2023-05-10 NOTE — Progress Notes (Signed)
Subjective:    Patient ID: John [name], male    DOB: 11-04-1955, 68 y.o.   MRN: [id number]  HPI  68 yo for FU of moderate COPD, likely asthma COPD overlap.  Former smoker -57 pack years, quit 2015  Meds : used Breo 2024, and used some Trelegy samples- very expensive, turned down for [facility] assistance  Discussed the use of AI scribe software for clinical note transcription with the patient, who gave verbal consent to proceed.  History of Present Illness   The patient is a 68 year old man with a history of COPD, chronic bronchitis, and asthma. He presents today for a routine follow-up. 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 He is currently taking Symbicort for his COPD and reports that it is working well for him. He takes it once a day in the evening and has not needed to use a rescue inhaler. He also has a history of smoking but quit in 2015 and has since been vaping. He recently had surgery for two fused vertebrae and is still recovering. He also has a kidney stone and an aneurysm, which are being monitored annually. He has not had a flu shot and does not plan to get one.     He was unhappy with ancillary staff support at [facility] and wants to switch over to [facility] location and presents to establish with me  Significant tests/ events reviewed  10/2022 CT angio aorta >> aneurysmal dilation of the proximal descending thoracic aorta measuring 3.8 x 3.4 cm. Aortic arch measures 2.9 cm. Ascending thoracic aorta measures 3.7 cm  PFTs 10/2019 mild airway obstruction, ratio 65, FEV1 61%, FVC 71%, improved to 69% with 12% bronchodilator response  Review of Systems neg for any significant sore throat, dysphagia, itching, sneezing, nasal congestion or excess/ purulent secretions, fever, chills, sweats, unintended wt loss, pleuritic or exertional cp, hempoptysis, orthopnea pnd or change  in chronic leg swelling. Also denies presyncope, palpitations, heartburn, abdominal pain, nausea, vomiting, diarrhea or change in bowel or urinary habits, dysuria,hematuria, rash, arthralgias, visual complaints, headache, numbness weakness or ataxia.     Objective:   Physical Exam  Gen. Pleasant, well-nourished, in no distress ENT - no thrush, no pallor/icterus,no post nasal drip Neck: No JVD, no thyromegaly, no carotid bruits Lungs: no use of accessory muscles, no dullness to percussion, clear without rales or rhonchi  Cardiovascular: Rhythm regular, heart sounds  normal, no murmurs or gallops, no peripheral edema Musculoskeletal: No deformities, no cyanosis or clubbing        Assessment & Plan:    Assessment and Plan    Chronic Obstructive Pulmonary Disease (COPD) COPD with chronic bronchitis and significant 38-year smoking history. Currently managed with Symbicort, used once daily in the evening instead of the prescribed twice daily. Lung function tests show FEV1 at 60-62%, improving to 69-70% post-bronchodilator. Reports good symptom control with current regimen and prefers minimal medication use. Discussed benefits of morning Symbicort for better daytime control, small effect of montelukast, and role of albuterol as a rescue medication. - Continue Symbicort, advise morning use for better daytime control - Discontinue montelukast for one month trial - Renew albuterol prescription for rescue use - Follow-up in six months  Aortic Aneurysm Aortic aneurysm identified on a scan last year. Scheduled for annual scans to monitor progression. Concerned about radiation exposure due to smoking history. Discussed necessity of annual scans and potential  to reduce frequency if stable. - Continue annual scans to monitor aortic aneurysm, next 10/2023  - Coordinate with cardiology for follow-up and review of scan results   General Health Maintenance Averse to vaccinations but has received  tetanus. Has not received flu, COVID, or RSV vaccines. Lives alone with minimal social contact, cautious about infections. Discussed benefits and effectiveness of RSV (90% effective, good for at least two years) and flu vaccines (35-40% effective). - Recommend flu vaccine - Recommend RSV vaccine - Discuss potential benefits of COVID vaccine  Follow-up - Schedule follow-up appointment in six months.

## 2023-05-15 DIAGNOSIS — M542 Cervicalgia: Secondary | ICD-10-CM | POA: Diagnosis not present

## 2023-05-15 NOTE — Progress Notes (Signed)
 [name] [address] [place] [address] Phone: [phone] Subjective:   John [name], am serving as a scribe for Dr. Arthea Claudene.  I'm seeing this patient by the request  of:  [name], MD  CC: Right shoulder pain  YEP:Dlagzrupcz  08/22/22: Patient will be following up with neurosurgery and likely having surgery at the end of summer or early fall.  Patient needs to continue to work he states.  Hopefully the injection will help and knows that the possibility for any type of tendon injury can be done with this.  Will monitor closely     Patient given injection.  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  Discussed icing regimen and home exercises.  Discussed which activities to do and which ones to avoid.  Increase activity slowly.  Arm compression sleeve also discussed.  Follow-up again in 6 to 8 weeks     Update 05/16/23:  John [name] is a 68 y.o. male coming in with complaint of neck and R shoulder pain. Patient had neck surgery on 03/29/23.  Patient states here for advice and second opinion after surgery. Doesn't think it worked. Still having pain and it is getting worse.  Reviewing patient's chart he is scheduled to have a another scan done for his aortic aneurysm in July 2025.  Coordinating between pulmonary and cardiology.  Past Medical History:  Diagnosis Date   Afib (HCC)    no issues since 2018/2019   Anxiety and depression    Asthma    Complication of anesthesia    blood pressure dropped when trying to have ACDF at surgery center- aborted surgery and re-scheduled   Emphysema of lung (HCC)    GERD (gastroesophageal reflux disease)    Hernia    right inguinal   History of bronchitis    History of kidney stones    pt reports kidney stone on scan   Hyperlipidemia    Hypertension    Morton's neuroma    OA (osteoarthritis)    neck   Obstructive sleep apnea on CPAP     not using CPAP currently   Pancreatitis    Torn meniscus    Left   Past Surgical History:  Procedure Laterality Date   ANTERIOR CERVICAL DECOMP/DISCECTOMY FUSION N/A 03/29/2023   Procedure: Anterior Cervical Decompression/Discectomy Fusion, Cervical Five-Cervical Six;  Surgeon: Debby Dorn MATSU, MD;  Location: [facility] OR;  Service: Neurosurgery;  Laterality: N/A;  3C   BONE CYST EXCISION  1991   left wrist   COLONOSCOPY     HERNIA REPAIR  04/14/2011   right inguinal   HERNIA REPAIR     left inguinal, december 2020   Social History   Socioeconomic History   Marital status: Divorced    Spouse name: Not on file   Number of children: Not on file   Years of education: Not on file   Highest education level: Not on file  Occupational History   Not on file  Tobacco Use   Smoking status: Former    Current packs/day: 0.00    Average packs/day: 1.5 packs/day for 38.0 years (57.0 ttl pk-yrs)    Types: Cigarettes    Start date: 28    Quit date: 2015    Years since quitting: 10.1   Smokeless tobacco: Never  Vaping Use   Vaping status: Every Day   Substances: Nicotine  Substance and Sexual Activity  Alcohol use: Not Currently   Drug use: Yes    Comment: Delta-8 THC gummies 2-3 times/week   Sexual activity: Yes    Partners: Female  Other Topics Concern   Not on file  Social History Narrative   Not on file   Social Drivers of Health   Financial Resource Strain: Not on file  Food Insecurity: Not on file  Transportation Needs: Not on file  Physical Activity: Not on file  Stress: Not on file  Social Connections: Not on file   Allergies  Allergen Reactions   Nsaids Other (See Comments)    Upsets colon when taken in the evening time   Aprindine Other (See Comments)    Unknown reaction   Gabapentin  Nausea And Vomiting and Palpitations    And severe anxiety and terrible nightmares    Family History  Problem Relation Age of Onset   Hypertension Mother    Dementia Other     Hypertension Sister    Hypertension Brother      Current Outpatient Medications (Cardiovascular):    carvedilol  (COREG ) 6.25 MG tablet, Take 1 tablet (6.25 mg total) by mouth 2 (two) times daily with a meal.   diltiazem  (CARDIZEM  CD) 120 MG 24 hr capsule, TAKE 1 CAPSULE(120 MG) BY MOUTH DAILY   diltiazem  (CARDIZEM ) 30 MG tablet, TAKE 1 TABLET BY MOUTH EVERY 4 HOURS AS NEEDED HR ABOVE 100 AND TOP NUMBER B/P GREATER THAN 100   olmesartan  (BENICAR ) 40 MG tablet, TAKE 1 TABLET BY MOUTH EVERY DAY   rosuvastatin  (CRESTOR ) 20 MG tablet, TAKE 1 TABLET BY MOUTH EVERY DAY   sildenafil  (VIAGRA ) 25 MG tablet, Take 25 mg by mouth daily as needed for erectile dysfunction.  Current Outpatient Medications (Respiratory):    albuterol  (VENTOLIN  HFA) 108 (90 Base) MCG/ACT inhaler, Inhale 2 puffs into the lungs every 4 (four) hours as needed for wheezing or shortness of breath.   budesonide -formoterol  (SYMBICORT ) 160-4.5 MCG/ACT inhaler, Inhale 2 puffs into the lungs in the morning and at bedtime.   fluticasone  (FLONASE ) 50 MCG/ACT nasal spray, Place 2 sprays into both nostrils in the morning and at bedtime. (Patient taking differently: Place 1 spray into both nostrils in the morning and at bedtime.)   ipratropium (ATROVENT ) 0.06 % nasal spray, USE 1 SPRAY IN EACH NOSTRIL TWICE A DAY   montelukast  (SINGULAIR ) 10 MG tablet, TAKE 1 TABLET BY MOUTH EVERY DAY  Current Outpatient Medications (Analgesics):    traMADol  (ULTRAM ) 50 MG tablet, Take 50 mg by mouth 2 (two) times daily.  Current Outpatient Medications (Hematological):    Ferrous Sulfate (IRON) 28 MG TABS, Take 28 mg by mouth once a week.   vitamin B-12 (CYANOCOBALAMIN ) 250 MCG tablet, Take 250 mcg by mouth daily.  Current Outpatient Medications (Other):    DULoxetine  (CYMBALTA ) 20 MG capsule, Take 1 capsule (20 mg total) by mouth daily.   Vitamin D , Ergocalciferol , (DRISDOL ) 1.25 MG (50000 UNIT) CAPS capsule, Take 1 capsule (50,000 Units total) by  mouth every 7 (seven) days.   CHELATED MAGNESIUM  PO, Take 200 mg by mouth in the morning.   cholecalciferol (VITAMIN D3) 25 MCG (1000 UT) tablet, Take 1,000 Units by mouth every evening.   Coenzyme Q10 (COQ10) 200 MG CAPS, Take 200 mg by mouth daily.   esomeprazole  (NEXIUM ) 20 MG capsule, TAKE 1 CAPSULE (20 MG TOTAL) BY MOUTH DAILY AT 12 NOON.   Flaxseed, Linseed, (FLAX SEED OIL) 1000 MG CAPS, Take 1,000 mg by mouth daily.   Multiple Vitamins-Minerals (  MULTIVITAMIN WITH MINERALS) tablet, Take 1 tablet by mouth in the morning.   Omega-3 Fatty Acids (FISH OIL) 1000 MG CAPS, Take 1,000 mg by mouth 2 (two) times daily.   OVER THE COUNTER MEDICATION, Take 1 tablet by mouth 2 (two) times daily. Prosta-Metto supplement   OVER THE COUNTER MEDICATION, Take 1 tablet by mouth every evening. Herba vision gold supplement   POTASSIUM PO, Take 50 mg by mouth daily.   Prasterone, DHEA, (DHEA 50 PO), Take 100 mg by mouth daily.   simethicone (MYLICON) 125 MG chewable tablet, Chew 125 mg by mouth every 6 (six) hours as needed for flatulence.   TURMERIC CURCUMIN PO, Take 800 mg by mouth 2 (two) times daily.   vitamin E 180 MG (400 UNITS) capsule, Take 400 Units by mouth daily.   Reviewed prior external information including notes and imaging from  primary care provider As well as notes that were available from care everywhere and other healthcare systems.  Past medical history, social, surgical and family history all reviewed in electronic medical record.  No pertanent information unless stated regarding to the chief complaint.   Review of Systems:  No headache, visual changes, nausea, vomiting, diarrhea, constipation, dizziness, abdominal pain, skin rash, fevers, chills, night sweats, weight loss, swollen lymph nodes, body aches, joint swelling, chest pain, shortness of breath, mood changes. POSITIVE muscle aches  Objective  Blood pressure (!) 136/90, pulse 85, height 5' 11 (1.803 m), weight 209 lb (94.8  kg), SpO2 97%.   General: No apparent distress alert and oriented x3 mood and affect normal, dressed appropriately.  HEENT: Pupils equal, extraocular movements intact  Respiratory: Patient's speak in full sentences and does not appear short of breath  Cardiovascular: No lower extremity edema, non tender, no erythema  Neck exam does show some very mild loss lordosis.  Postsurgical scar on the anterior aspect of the neck does not show any type of infectious etiology.  Nontender in the area.  No redness. Neurovascular intact distally.    Impression and Recommendations:    The above documentation has been reviewed and is accurate and complete Rickey Sadowski M Celester Lech, DO

## 2023-05-16 ENCOUNTER — Ambulatory Visit (INDEPENDENT_AMBULATORY_CARE_PROVIDER_SITE_OTHER): Payer: Medicare Other

## 2023-05-16 ENCOUNTER — Ambulatory Visit: Payer: Medicare Other | Admitting: Family Medicine

## 2023-05-16 ENCOUNTER — Encounter: Payer: Self-pay | Admitting: Family Medicine

## 2023-05-16 VITALS — BP 136/90 | HR 85 | Ht 71.0 in | Wt 209.0 lb

## 2023-05-16 DIAGNOSIS — M2550 Pain in unspecified joint: Secondary | ICD-10-CM | POA: Diagnosis not present

## 2023-05-16 DIAGNOSIS — M5010 Cervical disc disorder with radiculopathy, unspecified cervical region: Secondary | ICD-10-CM | POA: Diagnosis not present

## 2023-05-16 DIAGNOSIS — M5021 Other cervical disc displacement,  high cervical region: Secondary | ICD-10-CM | POA: Diagnosis not present

## 2023-05-16 DIAGNOSIS — M542 Cervicalgia: Secondary | ICD-10-CM | POA: Diagnosis not present

## 2023-05-16 DIAGNOSIS — M4802 Spinal stenosis, cervical region: Secondary | ICD-10-CM | POA: Diagnosis not present

## 2023-05-16 DIAGNOSIS — M47812 Spondylosis without myelopathy or radiculopathy, cervical region: Secondary | ICD-10-CM | POA: Diagnosis not present

## 2023-05-16 DIAGNOSIS — Z4789 Encounter for other orthopedic aftercare: Secondary | ICD-10-CM | POA: Diagnosis not present

## 2023-05-16 LAB — COMPREHENSIVE METABOLIC PANEL
ALT: 34 U/L (ref 0–53)
AST: 28 U/L (ref 0–37)
Albumin: 4.3 g/dL (ref 3.5–5.2)
Alkaline Phosphatase: 59 U/L (ref 39–117)
BUN: 14 mg/dL (ref 6–23)
CO2: 28 meq/L (ref 19–32)
Calcium: 9.4 mg/dL (ref 8.4–10.5)
Chloride: 102 meq/L (ref 96–112)
Creatinine, Ser: 1.02 mg/dL (ref 0.40–1.50)
GFR: 76.87 mL/min (ref >60.00)
Glucose, Bld: 101 mg/dL — ABNORMAL HIGH (ref 70–99)
Potassium: 4.2 meq/L (ref 3.5–5.1)
Sodium: 137 meq/L (ref 135–145)
Total Bilirubin: 0.7 mg/dL (ref 0.2–1.2)
Total Protein: 6.8 g/dL (ref 6.0–8.3)

## 2023-05-16 LAB — VITAMIN B12: Vitamin B-12: 794 pg/mL (ref 211–911)

## 2023-05-16 LAB — VITAMIN D 25 HYDROXY (VIT D DEFICIENCY, FRACTURES): VITD: 38.75 ng/mL (ref 30.00–100.00)

## 2023-05-16 LAB — CBC WITH DIFFERENTIAL/PLATELET
Basophils Absolute: 0 10*3/uL (ref 0.0–0.1)
Basophils Relative: 0.6 % (ref 0.0–3.0)
Eosinophils Absolute: 0.1 10*3/uL (ref 0.0–0.7)
Eosinophils Relative: 2.2 % (ref 0.0–5.0)
HCT: 41.6 % (ref 39.0–52.0)
Hemoglobin: 14.2 g/dL (ref 13.0–17.0)
Lymphocytes Relative: 22.2 % (ref 12.0–46.0)
Lymphs Abs: 1.2 10*3/uL (ref 0.7–4.0)
MCHC: 34.2 g/dL (ref 30.0–36.0)
MCV: 97.1 fL (ref 78.0–100.0)
Monocytes Absolute: 0.6 10*3/uL (ref 0.1–1.0)
Monocytes Relative: 11.8 % (ref 3.0–12.0)
Neutro Abs: 3.5 10*3/uL (ref 1.4–7.7)
Neutrophils Relative %: 63.2 % (ref 43.0–77.0)
Platelets: 200 10*3/uL (ref 150.0–400.0)
RBC: 4.28 Mil/uL (ref 4.22–5.81)
RDW: 12.4 % (ref 11.5–15.5)
WBC: 5.5 10*3/uL (ref 4.0–10.5)

## 2023-05-16 LAB — TESTOSTERONE: Testosterone: 166.22 ng/dL — ABNORMAL LOW (ref 300.00–890.00)

## 2023-05-16 LAB — IBC PANEL
Iron: 135 ug/dL (ref 42–165)
Saturation Ratios: 32.7 % (ref 20.0–50.0)
TIBC: 413 ug/dL (ref 250.0–450.0)
Transferrin: 295 mg/dL (ref 212.0–360.0)

## 2023-05-16 LAB — C-REACTIVE PROTEIN: CRP: <1 mg/dL (ref 0.5–20.0)

## 2023-05-16 LAB — URIC ACID: Uric Acid, Serum: 5.3 mg/dL (ref 4.0–7.8)

## 2023-05-16 LAB — TSH: TSH: 0.98 u[IU]/mL (ref 0.35–5.50)

## 2023-05-16 LAB — SEDIMENTATION RATE: Sed Rate: 9 mm/h (ref 0–20)

## 2023-05-16 LAB — FERRITIN: Ferritin: 58 ng/mL (ref 22.0–322.0)

## 2023-05-16 MED ORDER — DULOXETINE HCL 20 MG PO CPEP: 20.0000 mg | ORAL_CAPSULE | Freq: Every day | ORAL | 0 refills | Status: DC

## 2023-05-16 MED ORDER — VITAMIN D (ERGOCALCIFEROL) 1.25 MG (50000 UNIT) PO CAPS: 50000.0000 [IU] | ORAL_CAPSULE | ORAL | 0 refills | Status: DC

## 2023-05-16 NOTE — Patient Instructions (Addendum)
Cymbalta 20mg   Labs today Vit D once weekly K2 daily See me again in 6 weeks with next xray and talk

## 2023-05-16 NOTE — Assessment & Plan Note (Signed)
 Patient is nearly 2 months out from the surgical intervention of his neck.  At this point though we need to get the x-rays not a significant amount of callus formation.  Discussed once weekly vitamin D , icing regimen, which activities to do and which ones to avoid.  We discussed with patient about trying to make sure that we optimize everything else to help with healing.  Would not change medical management significantly till talking to neurosurgery as well as getting in at least 3 months postsurgical.  Patient is in agreement with the plan and will follow-up with me again in 4 to 6 weeks

## 2023-05-17 ENCOUNTER — Other Ambulatory Visit: Payer: Self-pay

## 2023-05-17 DIAGNOSIS — M2550 Pain in unspecified joint: Secondary | ICD-10-CM

## 2023-05-17 LAB — TIQ-NTM

## 2023-05-17 LAB — PTH, INTACT AND CALCIUM

## 2023-05-18 ENCOUNTER — Other Ambulatory Visit: Payer: Medicare Other

## 2023-05-18 DIAGNOSIS — M2550 Pain in unspecified joint: Secondary | ICD-10-CM | POA: Diagnosis not present

## 2023-05-18 LAB — CYCLIC CITRUL PEPTIDE ANTIBODY, IGG: Cyclic Citrullin Peptide Ab: <16 U

## 2023-05-18 LAB — CALCIUM, IONIZED: Calcium, Ion: 5.3 mg/dL (ref 4.7–5.5)

## 2023-05-18 LAB — ANGIOTENSIN CONVERTING ENZYME: Angiotensin-Converting Enzyme: 34 U/L (ref 9–67)

## 2023-05-18 LAB — ANTI-NUCLEAR AB-TITER (ANA TITER): ANA Titer 1: 1:40 {titer} — ABNORMAL HIGH

## 2023-05-18 LAB — ANA: Anti Nuclear Antibody (ANA): POSITIVE — AB

## 2023-05-18 LAB — RHEUMATOID FACTOR: Rheumatoid fact SerPl-aCnc: <10 [IU]/mL (ref <14)

## 2023-05-19 LAB — PTH, INTACT AND CALCIUM
Calcium: 9.8 mg/dL (ref 8.6–10.3)
PTH: 44 pg/mL (ref 16–77)

## 2023-05-21 ENCOUNTER — Encounter: Payer: Self-pay | Admitting: Family Medicine

## 2023-05-22 ENCOUNTER — Ambulatory Visit: Payer: Medicare Other | Admitting: Family Medicine

## 2023-05-22 ENCOUNTER — Other Ambulatory Visit: Payer: Self-pay

## 2023-05-22 MED ORDER — METHYLPREDNISOLONE 4 MG PO TBPK: ORAL_TABLET | ORAL | 0 refills | Status: DC

## 2023-05-25 ENCOUNTER — Other Ambulatory Visit: Payer: Self-pay | Admitting: Internal Medicine

## 2023-05-25 DIAGNOSIS — I10 Essential (primary) hypertension: Secondary | ICD-10-CM

## 2023-05-25 DIAGNOSIS — I480 Paroxysmal atrial fibrillation: Secondary | ICD-10-CM

## 2023-05-25 DIAGNOSIS — E785 Hyperlipidemia, unspecified: Secondary | ICD-10-CM

## 2023-05-25 DIAGNOSIS — M4802 Spinal stenosis, cervical region: Secondary | ICD-10-CM | POA: Diagnosis not present

## 2023-05-28 ENCOUNTER — Encounter: Payer: Self-pay | Admitting: Internal Medicine

## 2023-05-28 ENCOUNTER — Ambulatory Visit (INDEPENDENT_AMBULATORY_CARE_PROVIDER_SITE_OTHER): Payer: Medicare Other | Admitting: Internal Medicine

## 2023-05-28 VITALS — BP 166/92 | HR 80 | Temp 98.4°F | Resp 16 | Ht 71.0 in | Wt 207.4 lb

## 2023-05-28 DIAGNOSIS — Z0001 Encounter for general adult medical examination with abnormal findings: Secondary | ICD-10-CM | POA: Insufficient documentation

## 2023-05-28 DIAGNOSIS — I10 Essential (primary) hypertension: Secondary | ICD-10-CM

## 2023-05-28 DIAGNOSIS — K5904 Chronic idiopathic constipation: Secondary | ICD-10-CM | POA: Diagnosis not present

## 2023-05-28 DIAGNOSIS — Z0000 Encounter for general adult medical examination without abnormal findings: Secondary | ICD-10-CM

## 2023-05-28 DIAGNOSIS — I480 Paroxysmal atrial fibrillation: Secondary | ICD-10-CM

## 2023-05-28 DIAGNOSIS — E785 Hyperlipidemia, unspecified: Secondary | ICD-10-CM

## 2023-05-28 DIAGNOSIS — R195 Other fecal abnormalities: Secondary | ICD-10-CM

## 2023-05-28 LAB — LIPID PANEL
Cholesterol: 127 mg/dL (ref 0–200)
HDL: 47.3 mg/dL (ref >39.00)
LDL Cholesterol: 64 mg/dL (ref 0–99)
NonHDL: 79.26
Total CHOL/HDL Ratio: 3
Triglycerides: 74 mg/dL (ref 0.0–149.0)
VLDL: 14.8 mg/dL (ref 0.0–40.0)

## 2023-05-28 LAB — URINALYSIS, ROUTINE W REFLEX MICROSCOPIC
Bilirubin Urine: NEGATIVE
Hgb urine dipstick: NEGATIVE
Ketones, ur: NEGATIVE
Leukocytes,Ua: NEGATIVE
Nitrite: NEGATIVE
RBC / HPF: NONE SEEN (ref 0–?)
Specific Gravity, Urine: 1.02 (ref 1.000–1.030)
Total Protein, Urine: NEGATIVE
Urine Glucose: NEGATIVE
Urobilinogen, UA: 0.2 (ref 0.0–1.0)
pH: 6 (ref 5.0–8.0)

## 2023-05-28 LAB — MAGNESIUM: Magnesium: 2 mg/dL (ref 1.5–2.5)

## 2023-05-28 LAB — CK: Total CK: 110 U/L (ref 7–232)

## 2023-05-28 MED ORDER — ROSUVASTATIN CALCIUM 20 MG PO TABS
ORAL_TABLET | ORAL | 0 refills | Status: DC
Start: 2023-05-28 — End: 2023-08-20

## 2023-05-28 MED ORDER — SILDENAFIL CITRATE 25 MG PO TABS: 25.0000 mg | ORAL_TABLET | Freq: Every day | ORAL | 5 refills | Status: AC | PRN

## 2023-05-28 MED ORDER — DILTIAZEM HCL ER COATED BEADS 120 MG PO CP24: ORAL_CAPSULE | ORAL | 0 refills | Status: DC

## 2023-05-28 NOTE — Patient Instructions (Signed)
 Health Maintenance, Male  Adopting a healthy lifestyle and getting preventive care are important in promoting health and wellness. Ask your health care provider about:  The right schedule for you to have regular tests and exams.  Things you can do on your own to prevent diseases and keep yourself healthy.  What should I know about diet, weight, and exercise?  Eat a healthy diet    Eat a diet that includes plenty of vegetables, fruits, low-fat dairy products, and lean protein.  Do not eat a lot of foods that are high in solid fats, added sugars, or sodium.  Maintain a healthy weight  Body mass index (BMI) is a measurement that can be used to identify possible weight problems. It estimates body fat based on height and weight. Your health care provider can help determine your BMI and help you achieve or maintain a healthy weight.  Get regular exercise  Get regular exercise. This is one of the most important things you can do for your health. Most adults should:  Exercise for at least 150 minutes each week. The exercise should increase your heart rate and make you sweat (moderate-intensity exercise).  Do strengthening exercises at least twice a week. This is in addition to the moderate-intensity exercise.  Spend less time sitting. Even light physical activity can be beneficial.  Watch cholesterol and blood lipids  Have your blood tested for lipids and cholesterol at 66 years of age, then have this test every 5 years.  You may need to have your cholesterol levels checked more often if:  Your lipid or cholesterol levels are high.  You are older than 66 years of age.  You are at high risk for heart disease.  What should I know about cancer screening?  Many types of cancers can be detected early and may often be prevented. Depending on your health history and family history, you may need to have cancer screening at various ages. This may include screening for:  Colorectal cancer.  Prostate cancer.  Skin cancer.  Lung  cancer.  What should I know about heart disease, diabetes, and high blood pressure?  Blood pressure and heart disease  High blood pressure causes heart disease and increases the risk of stroke. This is more likely to develop in people who have high blood pressure readings or are overweight.  Talk with your health care provider about your target blood pressure readings.  Have your blood pressure checked:  Every 3-5 years if you are 9-95 years of age.  Every year if you are 85 years old or older.  If you are between the ages of 29 and 29 and are a current or former smoker, ask your health care provider if you should have a one-time screening for abdominal aortic aneurysm (AAA).  Diabetes  Have regular diabetes screenings. This checks your fasting blood sugar level. Have the screening done:  Once every three years after age 23 if you are at a normal weight and have a low risk for diabetes.  More often and at a younger age if you are overweight or have a high risk for diabetes.  What should I know about preventing infection?  Hepatitis B  If you have a higher risk for hepatitis B, you should be screened for this virus. Talk with your health care provider to find out if you are at risk for hepatitis B infection.  Hepatitis C  Blood testing is recommended for:  Everyone born from [birth year] through 1963.  Anyone  with known risk factors for hepatitis C.  Sexually transmitted infections (STIs)  You should be screened each year for STIs, including gonorrhea and chlamydia, if:  You are sexually active and are younger than 66 years of age.  You are older than 66 years of age and your health care provider tells you that you are at risk for this type of infection.  Your sexual activity has changed since you were last screened, and you are at increased risk for chlamydia or gonorrhea. Ask your health care provider if you are at risk.  Ask your health care provider about whether you are at high risk for HIV. Your health care provider  may recommend a prescription medicine to help prevent HIV infection. If you choose to take medicine to prevent HIV, you should first get tested for HIV. You should then be tested every 3 months for as long as you are taking the medicine.  Follow these instructions at home:  Alcohol use  Do not drink alcohol if your health care provider tells you not to drink.  If you drink alcohol:  Limit how much you have to 0-2 drinks a day.  Know how much alcohol is in your drink. In the [place] one drink equals one 12 oz bottle of beer (355 mL), one 5 oz glass of wine (148 mL), or one 1 oz glass of hard liquor (44 mL).  Lifestyle  Do not use any products that contain nicotine or tobacco. These products include cigarettes, chewing tobacco, and vaping devices, such as e-cigarettes. If you need help quitting, ask your health care provider.  Do not use street drugs.  Do not share needles.  Ask your health care provider for help if you need support or information about quitting drugs.  General instructions  Schedule regular health, dental, and eye exams.  Stay current with your vaccines.  Tell your health care provider if:  You often feel depressed.  You have ever been abused or do not feel safe at home.  Summary  Adopting a healthy lifestyle and getting preventive care are important in promoting health and wellness.  Follow your health care provider's instructions about healthy diet, exercising, and getting tested or screened for diseases.  Follow your health care provider's instructions on monitoring your cholesterol and blood pressure.  This information is not intended to replace advice given to you by your health care provider. Make sure you discuss any questions you have with your health care provider.  Document Revised: 08/16/2020 Document Reviewed: 08/16/2020  Elsevier Patient Education  2024 [employer].

## 2023-05-28 NOTE — Progress Notes (Signed)
 Subjective:  Patient ID: John [name], male    DOB: Jul 28, 1955  Age: 68 y.o. MRN: [id number]  CC: Annual Exam, Hypertension, and Hyperlipidemia   HPI [name] presents for a CPX and f/up -----  Discussed the use of AI scribe software for clinical note transcription with the patient, who gave verbal consent to proceed.  History of Present Illness   John [name] "John [name]" is a 68 year old male with a history of ACDF surgery who presents with ongoing nerve pain and hypertension management.  He underwent ACDF surgery on December 19th, which did not yield the expected results, leading to significant ongoing nerve pain. He has completed three dose packs of steroids since the last week of December, with the most recent finished yesterday. Cymbalta was initiated 12 days ago for nerve pain management, as he cannot tolerate gabapentin or Lyrica. He is not currently taking tramadol for pain, opting to see the effects of Cymbalta first. He experiences constipation, a side effect of duloxetine, but does not feel the need for additional medication for it.  He has no symptoms of high blood pressure such as headaches, blurred vision, chest pain, or shortness of breath. His blood pressure readings have varied, with a recent high of 168 at a stressful doctor's appointment, but typically measure in the 120s to 130s at home. He is on olmesartan and carvedilol for blood pressure management. He is cautious about his blood sugar, avoiding sugary drinks to prevent spikes.  He has a history of atrial fibrillation but reports no recent episodes or irregular heartbeats. He uses Symbicort for respiratory issues and is monitored by a cardiologist and pulmonologist due to an aortic aneurysm, requiring annual scans of his heart and lungs.  He is taking a mega dose of vitamin D, 50,000 IUs weekly, and vitamin K2, 200 mg daily, to aid bone growth post-surgery. He consumes a lot of calcium and dairy, which he believes may  affect his cholesterol levels. His last specified labs were done 12 days ago, but did not include a cholesterol level.       Outpatient Medications Prior to Visit  Medication Sig Dispense Refill   albuterol (VENTOLIN HFA) 108 (90 Base) MCG/ACT inhaler Inhale 2 puffs into the lungs every 4 (four) hours as needed for wheezing or shortness of breath. 8 g 5   budesonide-formoterol (SYMBICORT) 160-4.5 MCG/ACT inhaler Inhale 2 puffs into the lungs in the morning and at bedtime. 10.2 g 5   carvedilol (COREG) 6.25 MG tablet Take 1 tablet (6.25 mg total) by mouth 2 (two) times daily with a meal. 180 tablet 1   CHELATED MAGNESIUM PO Take 200 mg by mouth in the morning.     cholecalciferol (VITAMIN D3) 25 MCG (1000 UT) tablet Take 1,000 Units by mouth every evening.     Coenzyme Q10 (COQ10) 200 MG CAPS Take 200 mg by mouth daily.     diltiazem (CARDIZEM) 30 MG tablet TAKE 1 TABLET BY MOUTH EVERY 4 HOURS AS NEEDED HR ABOVE 100 AND TOP NUMBER B/P GREATER THAN 100 30 tablet 1   esomeprazole (NEXIUM) 20 MG capsule TAKE 1 CAPSULE (20 MG TOTAL) BY MOUTH DAILY AT 12 NOON. 90 capsule 0   Ferrous Sulfate (IRON) 28 MG TABS Take 28 mg by mouth once a week.     Flaxseed, Linseed, (FLAX SEED OIL) 1000 MG CAPS Take 1,000 mg by mouth daily.     fluticasone (FLONASE) 50 MCG/ACT nasal spray Place 2 sprays into  both nostrils in the morning and at bedtime. (Patient taking differently: Place 1 spray into both nostrils in the morning and at bedtime.) 48 g 0   ipratropium (ATROVENT) 0.06 % nasal spray USE 1 SPRAY IN EACH NOSTRIL TWICE A DAY 45 mL 5   Menaquinone-7 (K2 PO) Take 200 mg by mouth daily.     montelukast (SINGULAIR) 10 MG tablet TAKE 1 TABLET BY MOUTH EVERY DAY 90 tablet 0   Multiple Vitamins-Minerals (MULTIVITAMIN WITH MINERALS) tablet Take 1 tablet by mouth in the morning.     olmesartan (BENICAR) 40 MG tablet TAKE 1 TABLET BY MOUTH EVERY DAY 90 tablet 0   Omega-3 Fatty Acids (FISH OIL) 1000 MG CAPS Take 1,000 mg  by mouth 2 (two) times daily.     OVER THE COUNTER MEDICATION Take 1 tablet by mouth 2 (two) times daily. Prosta-Metto supplement     OVER THE COUNTER MEDICATION Take 1 tablet by mouth every evening. Herba vision gold supplement     POTASSIUM PO Take 50 mg by mouth daily.     Prasterone, DHEA, (DHEA 50 PO) Take 100 mg by mouth daily.     simethicone (MYLICON) 125 MG chewable tablet Chew 125 mg by mouth every 6 (six) hours as needed for flatulence.     traMADol (ULTRAM) 50 MG tablet Take 50 mg by mouth 2 (two) times daily.     TURMERIC CURCUMIN PO Take 800 mg by mouth 2 (two) times daily.     vitamin B-12 (CYANOCOBALAMIN) 250 MCG tablet Take 250 mcg by mouth daily.     Vitamin D, Ergocalciferol, (DRISDOL) 1.25 MG (50000 UNIT) CAPS capsule Take 1 capsule (50,000 Units total) by mouth every 7 (seven) days. 12 capsule 0   vitamin E 180 MG (400 UNITS) capsule Take 400 Units by mouth daily.     diltiazem (CARDIZEM CD) 120 MG 24 hr capsule TAKE 1 CAPSULE(120 MG) BY MOUTH DAILY 90 capsule 0   DULoxetine (CYMBALTA) 20 MG capsule Take 1 capsule (20 mg total) by mouth daily. 30 capsule 0   rosuvastatin (CRESTOR) 20 MG tablet TAKE 1 TABLET BY MOUTH EVERY DAY 90 tablet 0   sildenafil (VIAGRA) 25 MG tablet Take 25 mg by mouth daily as needed for erectile dysfunction.     methylPREDNISolone (MEDROL DOSEPAK) 4 MG TBPK tablet 4 pills day 1, 3 pills day 2-3, 2 pills day 4-7, 1 pill day 8-10 (Patient not taking: Reported on 05/28/2023) 21 tablet 0   No facility-administered medications prior to visit.    ROS Review of Systems  Constitutional:  Negative for chills, diaphoresis, fatigue and fever.  HENT: Negative.    Eyes: Negative.   Respiratory:  Positive for apnea. Negative for cough, chest tightness, shortness of breath and wheezing.   Cardiovascular:  Negative for chest pain, palpitations and leg swelling.  Gastrointestinal:  Positive for constipation. Negative for abdominal distention, abdominal pain,  anal bleeding, blood in stool, diarrhea, nausea and vomiting.  Endocrine: Negative.   Genitourinary:  Negative for difficulty urinating and dysuria.  Musculoskeletal:  Positive for arthralgias and neck pain. Negative for back pain, joint swelling, myalgias and neck stiffness.  Skin: Negative.  Negative for color change and pallor.  Neurological: Negative.  Negative for dizziness and weakness.  Hematological:  Negative for adenopathy. Does not bruise/bleed easily.  Psychiatric/Behavioral: Negative.      Objective:  BP (!) 166/92 (BP Location: Left Arm, Patient Position: Sitting, Cuff Size: Large)   Pulse 80   Temp  98.4 F (36.9 C) (Oral)   Resp 16   Ht 5\' 11"  (1.803 m)   Wt 207 lb 6.4 oz (94.1 kg)   SpO2 98%   BMI 28.93 kg/m   BP Readings from Last 3 Encounters:  05/28/23 (!) 166/92  05/16/23 (!) 136/90  05/10/23 130/80    Wt Readings from Last 3 Encounters:  05/28/23 207 lb 6.4 oz (94.1 kg)  05/16/23 209 lb (94.8 kg)  05/10/23 208 lb (94.3 kg)    Physical Exam Vitals reviewed.  Constitutional:      Appearance: Normal appearance.  HENT:     Nose: Nose normal.     Mouth/Throat:     Mouth: Mucous membranes are moist.  Eyes:     General: No scleral icterus.    Conjunctiva/sclera: Conjunctivae normal.  Cardiovascular:     Rate and Rhythm: Normal rate and regular rhythm.     Heart sounds: No murmur heard.    No friction rub. No gallop.  Pulmonary:     Effort: Pulmonary effort is normal.     Breath sounds: No stridor. No wheezing, rhonchi or rales.  Abdominal:     General: Abdomen is flat. Bowel sounds are decreased.     Palpations: There is no hepatomegaly, splenomegaly or mass.     Tenderness: There is no abdominal tenderness. There is no guarding.  Genitourinary:    Comments: GU/DRE - deferred at his request Musculoskeletal:        General: Normal range of motion.     Cervical back: Neck supple.     Right lower leg: No edema.     Left lower leg: No edema.   Lymphadenopathy:     Cervical: No cervical adenopathy.  Skin:    General: Skin is warm and dry.  Neurological:     General: No focal deficit present.     Mental Status: He is alert. Mental status is at baseline.  Psychiatric:        Mood and Affect: Mood normal.        Behavior: Behavior normal.     Lab Results  Component Value Date   WBC 5.5 05/16/2023   HGB 14.2 05/16/2023   HCT 41.6 05/16/2023   PLT 200.0 05/16/2023   GLUCOSE 101 (H) 05/16/2023   CHOL 127 05/28/2023   TRIG 74.0 05/28/2023   HDL 47.30 05/28/2023   LDLCALC 64 05/28/2023   ALT 34 05/16/2023   AST 28 05/16/2023   NA 137 05/16/2023   K 4.2 05/16/2023   CL 102 05/16/2023   CREATININE 1.02 05/16/2023   BUN 14 05/16/2023   CO2 28 05/16/2023   TSH 0.98 05/16/2023   PSA 1.15 12/19/2022   HGBA1C 5.8 05/22/2022    DG Cervical Spine 1 View Result Date: 03/29/2023 CLINICAL DATA:  C5-C6 fusion. EXAM: DG CERVICAL SPINE - 1 VIEW COMPARISON:  MRI cervical spine dated January 18, 2023. FLUOROSCOPY TIME:  Radiation Exposure Index (as provided by the fluoroscopic device): 2.6 mGy Kerma C-arm fluoroscopic images were obtained intraoperatively and submitted for post operative interpretation. FINDINGS: Multiple intraoperative fluoroscopic images demonstrate interval C5-C6 ACDF. No acute osseous abnormality. IMPRESSION: 1. Intraoperative fluoroscopic guidance for C5-C6 ACDF. Electronically Signed   By: Obie Dredge M.D.   On: 03/29/2023 15:16   DG C-Arm 1-60 Min-No Report Result Date: 03/29/2023 Fluoroscopy was utilized by the requesting physician.  No radiographic interpretation.   DG C-Arm 1-60 Min-No Report Result Date: 03/29/2023 Fluoroscopy was utilized by the requesting physician.  No  radiographic interpretation.    Assessment & Plan:   Hyperlipidemia LDL goal <130- LDL goal achieved. Doing well on the statin  -     Lipid panel; Future -     Rosuvastatin Calcium; TAKE 1 TABLET BY MOUTH EVERY DAY  Dispense:  90 tablet; Refill: 0 -     CK; Future  Primary hypertension- He has not achieved his BP goal. Will add lozol. -     Aldosterone + renin activity w/ ratio; Future -     Urinalysis, Routine w reflex microscopic; Future -     dilTIAZem HCl ER Coated Beads; TAKE 1 CAPSULE(120 MG) BY MOUTH DAILY  Dispense: 90 capsule; Refill: 0 -     Indapamide; Take 1 tablet (1.25 mg total) by mouth daily.  Dispense: 90 tablet; Refill: 0  Chronic idiopathic constipation -     Magnesium; Future  Encounter for general adult medical examination with abnormal findings- Exam completed, labs reviewed, vaccines reviewed, cancer screenings addressed, pt ed material was given.   PAF (paroxysmal atrial fibrillation) (HCC)- He has good R/R control. -     dilTIAZem HCl ER Coated Beads; TAKE 1 CAPSULE(120 MG) BY MOUTH DAILY  Dispense: 90 capsule; Refill: 0  Positive colorectal cancer screening using Cologuard test -     Ambulatory referral to Gastroenterology  Other orders -     Sildenafil Citrate; Take 1 tablet (25 mg total) by mouth daily as needed for erectile dysfunction.  Dispense: 10 tablet; Refill: 5     Follow-up: Return in about 6 months (around 11/25/2023).  Sanda Linger, MD

## 2023-05-30 DIAGNOSIS — H04123 Dry eye syndrome of bilateral lacrimal glands: Secondary | ICD-10-CM | POA: Diagnosis not present

## 2023-05-30 DIAGNOSIS — H02831 Dermatochalasis of right upper eyelid: Secondary | ICD-10-CM | POA: Diagnosis not present

## 2023-05-30 DIAGNOSIS — H43813 Vitreous degeneration, bilateral: Secondary | ICD-10-CM | POA: Diagnosis not present

## 2023-05-30 DIAGNOSIS — H2513 Age-related nuclear cataract, bilateral: Secondary | ICD-10-CM | POA: Diagnosis not present

## 2023-05-31 ENCOUNTER — Telehealth: Payer: Self-pay | Admitting: Family Medicine

## 2023-05-31 NOTE — Telephone Encounter (Signed)
Sent patient MyChart message with recommendations.  

## 2023-05-31 NOTE — Telephone Encounter (Signed)
error 

## 2023-05-31 NOTE — Telephone Encounter (Signed)
Patient called back in regards to this. He said that since this message, he has had two really bad nights. He said that he is not getting any relief and feels like he is getting worse.  Please advise.

## 2023-05-31 NOTE — Telephone Encounter (Signed)
Patient called back. He wanted Dr Katrinka Blazing to know that he is getting much worse and would like to speak to someone.

## 2023-06-04 ENCOUNTER — Other Ambulatory Visit: Payer: Self-pay

## 2023-06-04 MED ORDER — DULOXETINE HCL 20 MG PO CPEP: 20.0000 mg | ORAL_CAPSULE | Freq: Every day | ORAL | 0 refills | Status: DC

## 2023-06-06 LAB — ALDOSTERONE + RENIN ACTIVITY W/ RATIO
ALDO / PRA Ratio: 0.6 {ratio} — ABNORMAL LOW (ref 0.9–28.9)
Aldosterone: 3 ng/dL
Renin Activity: 4.86 ng/mL/h (ref 0.25–5.82)

## 2023-06-06 MED ORDER — INDAPAMIDE 1.25 MG PO TABS: 1.2500 mg | ORAL_TABLET | Freq: Every day | ORAL | 0 refills | Status: DC

## 2023-06-12 DIAGNOSIS — M5412 Radiculopathy, cervical region: Secondary | ICD-10-CM | POA: Diagnosis not present

## 2023-06-13 ENCOUNTER — Other Ambulatory Visit: Payer: Self-pay | Admitting: Internal Medicine

## 2023-06-13 DIAGNOSIS — I10 Essential (primary) hypertension: Secondary | ICD-10-CM

## 2023-06-13 DIAGNOSIS — I480 Paroxysmal atrial fibrillation: Secondary | ICD-10-CM

## 2023-06-20 DIAGNOSIS — M5412 Radiculopathy, cervical region: Secondary | ICD-10-CM | POA: Diagnosis not present

## 2023-06-22 ENCOUNTER — Other Ambulatory Visit: Payer: Self-pay | Admitting: Internal Medicine

## 2023-06-22 DIAGNOSIS — J309 Allergic rhinitis, unspecified: Secondary | ICD-10-CM

## 2023-06-29 NOTE — Progress Notes (Signed)
 [facility] [address] [place] [address] Phone: [phone] Subjective:   [name], am serving as a scribe for Dr. Antoine Primas.  I'm seeing this patient by the request  of:  [name], MD  CC: Neck pain follow-up  BJY:NWGNFAOZHY  05/16/2023 Patient is nearly 2 months out from the surgical intervention of his neck.  At this point though we need to get the x-rays not a significant amount of callus formation.  Discussed once weekly vitamin D, icing regimen, which activities to do and which ones to avoid.  We discussed with patient about trying to make sure that we optimize everything else to help with healing.  Would not change medical management significantly till talking to neurosurgery as well as getting in at least 3 months postsurgical.  Patient is in agreement with the plan and will follow-up with me again in 4 to 6 weeks      Update 07/02/2023 John [name] is a 68 y.o. male coming in with complaint of cervical radiculopathy.  Started on Cymbalta more regularly.  Was getting some relief.  Still healing from the surgical intervention.  Patient states doing worse since the last visit. Saw [facility] adviser. Got an epidural, but only worked for 2 days. Started lyrica and also hasn't helped, only gotten side effects. Dizzy and tired all the time and swelling in ankles along with SOB. Severe pain over B SI joints. Pain goes away after sitting down.  Did work patient up for multiple other ailments.  ANA was positive but low titer.  Was found to have a low testosterone at 166.  Otherwise laboratory workup was unremarkable.     Past Medical History:  Diagnosis Date   Afib (HCC)    no issues since 2018/2019   Anxiety and depression    Asthma    Complication of anesthesia    blood pressure dropped when trying to have ACDF at surgery center- aborted surgery and re-scheduled   Emphysema of lung (HCC)    GERD (gastroesophageal reflux disease)     Hernia    right inguinal   History of bronchitis    History of kidney stones    pt reports kidney stone on scan   Hyperlipidemia    Hypertension    Morton's neuroma    OA (osteoarthritis)    neck   Obstructive sleep apnea on CPAP    not using CPAP currently   Pancreatitis    Torn meniscus    Left   Past Surgical History:  Procedure Laterality Date   ANTERIOR CERVICAL DECOMP/DISCECTOMY FUSION N/A 03/29/2023   Procedure: Anterior Cervical Decompression/Discectomy Fusion, Cervical Five-Cervical Six;  Surgeon: Bedelia Person, MD;  Location: [facility] OR;  Service: Neurosurgery;  Laterality: N/A;  3C   BONE CYST EXCISION  1991   left wrist   COLONOSCOPY     HERNIA REPAIR  04/14/2011   right inguinal   HERNIA REPAIR     left inguinal, december 2020   Social History   Socioeconomic History   Marital status: Divorced    Spouse name: Not on file   Number of children: Not on file   Years of education: Not on file   Highest education level: Not on file  Occupational History   Not on file  Tobacco Use   Smoking status: Former    Current packs/day: 0.00    Average packs/day: 1.5 packs/day for 38.0 years (57.0 ttl pk-yrs)    Types:  Cigarettes    Start date: 14    Quit date: 2015    Years since quitting: 10.2   Smokeless tobacco: Never  Vaping Use   Vaping status: Every Day   Substances: Nicotine  Substance and Sexual Activity   Alcohol use: Not Currently   Drug use: Yes    Comment: Delta-8 THC gummies 2-3 times/week   Sexual activity: Yes    Partners: Female  Other Topics Concern   Not on file  Social History Narrative   Not on file   Social Drivers of Health   Financial Resource Strain: Not on file  Food Insecurity: Not on file  Transportation Needs: Not on file  Physical Activity: Not on file  Stress: Not on file  Social Connections: Not on file   Allergies  Allergen Reactions   Nsaids Other (See Comments)    Upsets colon when taken in the evening time    Aprindine Other (See Comments)    Unknown reaction   Gabapentin Nausea And Vomiting and Palpitations    And severe anxiety and terrible nightmares    Family History  Problem Relation Age of Onset   Hypertension Mother    Dementia Other    Hypertension Sister    Hypertension Brother      Current Outpatient Medications (Cardiovascular):    carvedilol (COREG) 6.25 MG tablet, TAKE 1 TABLET BY MOUTH 2 TIMES DAILY WITH A MEAL.   diltiazem (CARDIZEM CD) 120 MG 24 hr capsule, TAKE 1 CAPSULE(120 MG) BY MOUTH DAILY   diltiazem (CARDIZEM) 30 MG tablet, TAKE 1 TABLET BY MOUTH EVERY 4 HOURS AS NEEDED HR ABOVE 100 AND TOP NUMBER B/P GREATER THAN 100   indapamide (LOZOL) 1.25 MG tablet, Take 1 tablet (1.25 mg total) by mouth daily.   olmesartan (BENICAR) 40 MG tablet, TAKE 1 TABLET BY MOUTH EVERY DAY   rosuvastatin (CRESTOR) 20 MG tablet, TAKE 1 TABLET BY MOUTH EVERY DAY   sildenafil (VIAGRA) 25 MG tablet, Take 1 tablet (25 mg total) by mouth daily as needed for erectile dysfunction.  Current Outpatient Medications (Respiratory):    albuterol (VENTOLIN HFA) 108 (90 Base) MCG/ACT inhaler, Inhale 2 puffs into the lungs every 4 (four) hours as needed for wheezing or shortness of breath.   budesonide-formoterol (SYMBICORT) 160-4.5 MCG/ACT inhaler, Inhale 2 puffs into the lungs in the morning and at bedtime.   fluticasone (FLONASE) 50 MCG/ACT nasal spray, Place 2 sprays into both nostrils in the morning and at bedtime. (Patient taking differently: Place 1 spray into both nostrils in the morning and at bedtime.)   ipratropium (ATROVENT) 0.06 % nasal spray, USE 1 SPRAY IN EACH NOSTRIL TWICE A DAY   montelukast (SINGULAIR) 10 MG tablet, TAKE 1 TABLET BY MOUTH EVERY DAY  Current Outpatient Medications (Analgesics):    traMADol (ULTRAM) 50 MG tablet, Take 50 mg by mouth 2 (two) times daily.  Current Outpatient Medications (Hematological):    Ferrous Sulfate (IRON) 28 MG TABS, Take 28 mg by mouth once a  week.   vitamin B-12 (CYANOCOBALAMIN) 250 MCG tablet, Take 250 mcg by mouth daily.  Current Outpatient Medications (Other):    CHELATED MAGNESIUM PO, Take 200 mg by mouth in the morning.   cholecalciferol (VITAMIN D3) 25 MCG (1000 UT) tablet, Take 1,000 Units by mouth every evening.   Coenzyme Q10 (COQ10) 200 MG CAPS, Take 200 mg by mouth daily.   DULoxetine (CYMBALTA) 20 MG capsule, Take 1 capsule (20 mg total) by mouth daily.   esomeprazole (  NEXIUM) 20 MG capsule, TAKE 1 CAPSULE (20 MG TOTAL) BY MOUTH DAILY AT 12 NOON.   Flaxseed, Linseed, (FLAX SEED OIL) 1000 MG CAPS, Take 1,000 mg by mouth daily.   Menaquinone-7 (K2 PO), Take 200 mg by mouth daily.   Multiple Vitamins-Minerals (MULTIVITAMIN WITH MINERALS) tablet, Take 1 tablet by mouth in the morning.   Omega-3 Fatty Acids (FISH OIL) 1000 MG CAPS, Take 1,000 mg by mouth 2 (two) times daily.   OVER THE COUNTER MEDICATION, Take 1 tablet by mouth 2 (two) times daily. Prosta-Metto supplement   OVER THE COUNTER MEDICATION, Take 1 tablet by mouth every evening. Herba vision gold supplement   POTASSIUM PO, Take 50 mg by mouth daily.   Prasterone, DHEA, (DHEA 50 PO), Take 100 mg by mouth daily.   simethicone (MYLICON) 125 MG chewable tablet, Chew 125 mg by mouth every 6 (six) hours as needed for flatulence.   TURMERIC CURCUMIN PO, Take 800 mg by mouth 2 (two) times daily.   Vitamin D, Ergocalciferol, (DRISDOL) 1.25 MG (50000 UNIT) CAPS capsule, Take 1 capsule (50,000 Units total) by mouth every 7 (seven) days.   vitamin E 180 MG (400 UNITS) capsule, Take 400 Units by mouth daily.   Reviewed prior external information including notes and imaging from  primary care provider As well as notes that were available from care everywhere and other healthcare systems.  Past medical history, social, surgical and family history all reviewed in electronic medical record.  No pertanent information unless stated regarding to the chief complaint.   Review  of Systems:  No headache, visual changes, nausea, vomiting, diarrhea, constipation, dizziness, abdominal pain, skin rash, fevers, chills, night sweats, weight loss, swollen lymph nodes, body aches, joint swelling, chest pain, shortness of breath, mood changes. POSITIVE muscle aches  Objective  Blood pressure 124/80, pulse 84, height 5\' 11"  (1.803 m), SpO2 96%.   General: No apparent distress alert and oriented x3 mood and affect normal, dressed appropriately.  HEENT: Pupils equal, extraocular movements intact  Respiratory: Patient's speak in full sentences and does not appear short of breath  Cardiovascular: No lower extremity edema, non tender, no erythema  Neck exam shows patient does have some limited range of motion noted.  Patient does have some weakness noted in the right upper extremity in the C6-C7 distribution noted. Low back severe tenderness to palpation over the sacroiliac joints bilaterally.  Negative straight leg test noted.  Worsening pain with extension of the back noted.  After verbal consent patient was prepped with alcohol swab and with a 21-gauge 2 inch needle injected into the right sacroiliac joint.  Total of 1 cc of 0.5% Marcaine and 1 cc of Kenalog 40 mg/mL used.  No blood loss.  Band-Aid placed.  Postinjection instructions given.  After verbal consent patient was prepped with alcohol swab and with a 21-gauge 2 inch needle injected into the left sacroiliac joint with a total of 1 cc of 0.5% Marcaine and 1 cc of Kenalog 40 mg/mL.  No blood loss.  Band-Aid placed.  Postinjection instructions given.   Impression and Recommendations:     The above documentation has been reviewed and is accurate and complete Judi Saa, DO

## 2023-07-02 ENCOUNTER — Encounter: Payer: Self-pay | Admitting: Family Medicine

## 2023-07-02 ENCOUNTER — Ambulatory Visit: Payer: Medicare Other | Admitting: Family Medicine

## 2023-07-02 VITALS — BP 124/80 | HR 84 | Ht 71.0 in

## 2023-07-02 DIAGNOSIS — M5136 Other intervertebral disc degeneration, lumbar region with discogenic back pain only: Secondary | ICD-10-CM | POA: Diagnosis not present

## 2023-07-02 DIAGNOSIS — G8929 Other chronic pain: Secondary | ICD-10-CM

## 2023-07-02 DIAGNOSIS — M5010 Cervical disc disorder with radiculopathy, unspecified cervical region: Secondary | ICD-10-CM

## 2023-07-02 DIAGNOSIS — M533 Sacrococcygeal disorders, not elsewhere classified: Secondary | ICD-10-CM

## 2023-07-02 DIAGNOSIS — M461 Sacroiliitis, not elsewhere classified: Secondary | ICD-10-CM

## 2023-07-02 IMAGING — XA DG INJECT/[PERSON_NAME] INC NEEDLE/CATH/PLC EPI/CERV/THOR W/IMG
2 series · 2 of 2 positions shown · non-contrast
Comparison: none

CLINICAL DATA: Cervical spondylosis without myelopathy. Recurrent
neck pain, right shoulder pain, and right upper extremity
numbness/burning. Positive response to multiple prior epidural
injections though with a shorter duration of relief following the
most recent injection in [DATE].

[Series 1: ortho standard · 1 of 1 slices shown (1 of 2)]
[im 1/1]
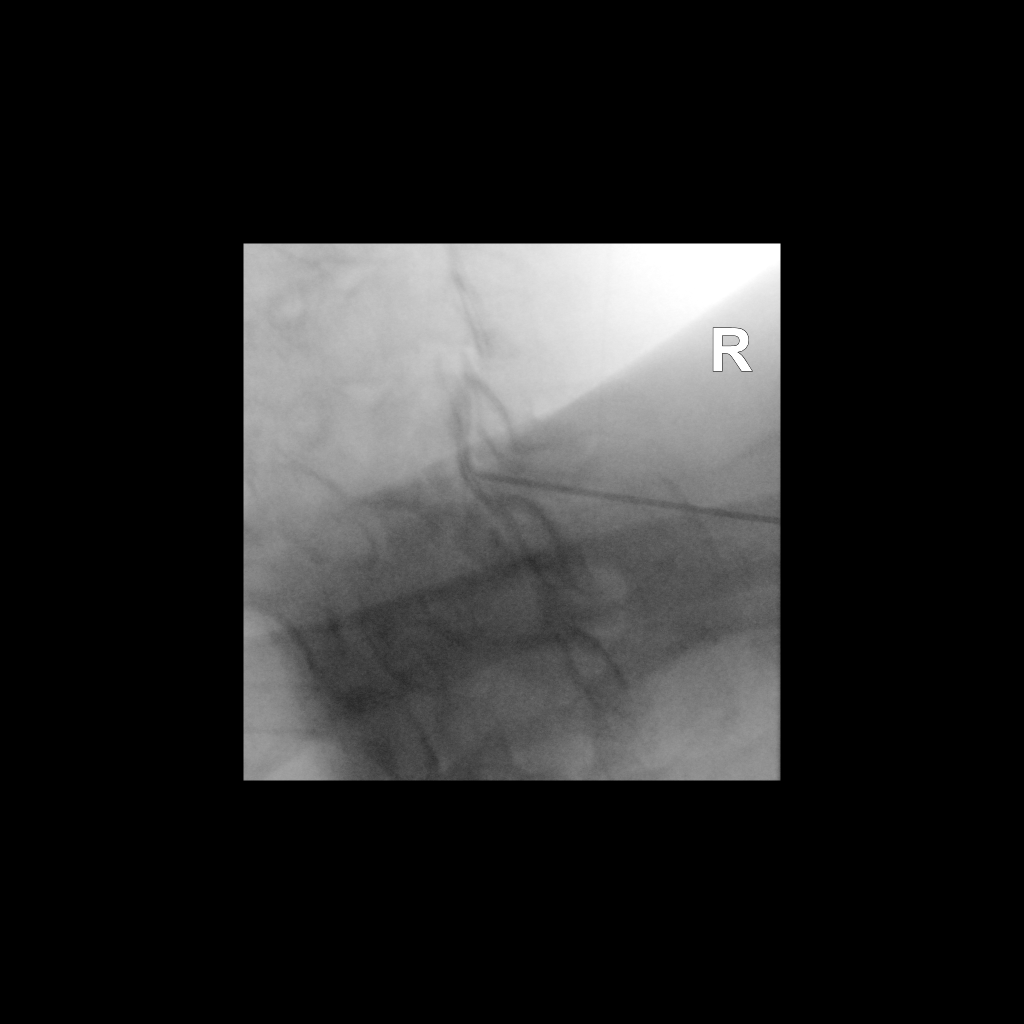

[Series 2: ortho standard · 1 of 1 slices shown (2 of 2)]
[im 1/1]
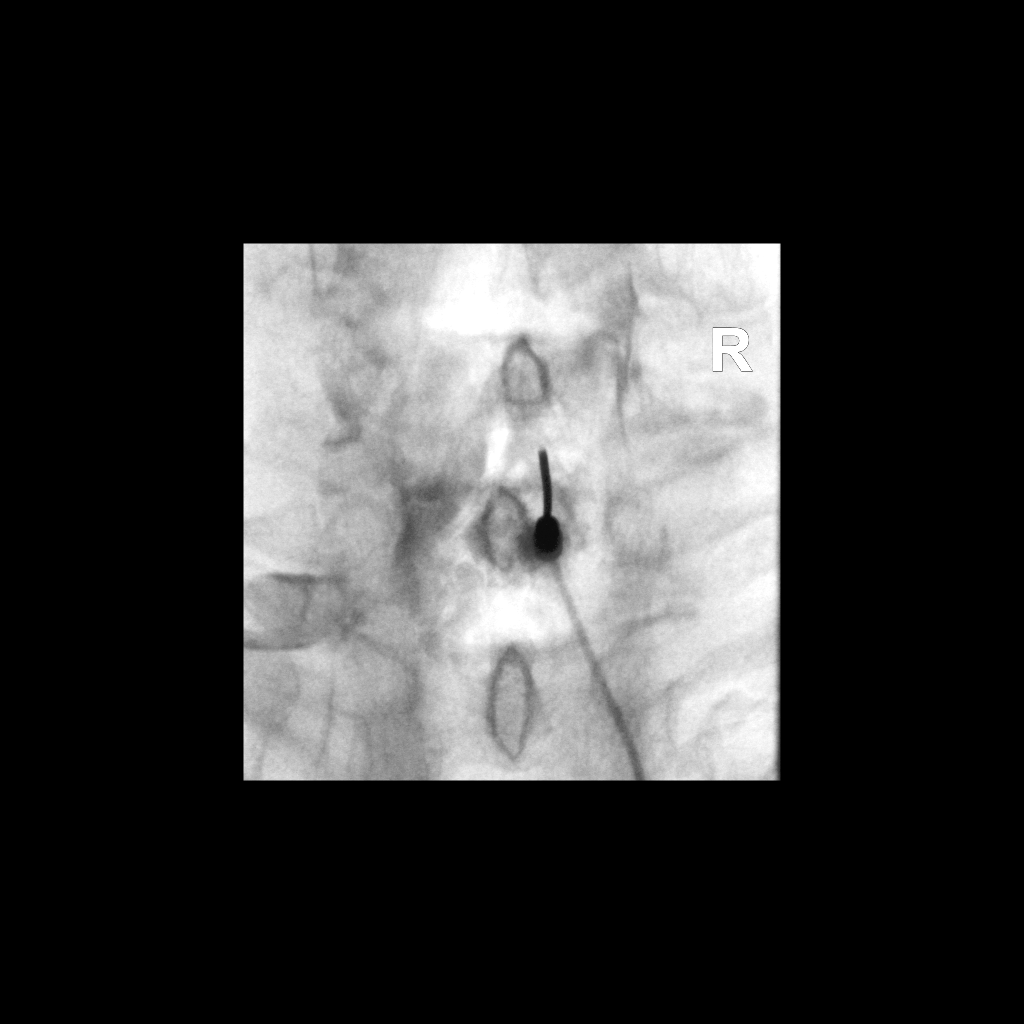

[2 of 2 positions shown; findings below may reference images not displayed]

FLUOROSCOPY TIME:  Fluoroscopy Time: 21 seconds

Radiation Exposure Index: 15.65 microGray*m^2

PROCEDURE:
The procedure, risks, benefits, and alternatives were explained to
the patient. Questions regarding the procedure were encouraged and
answered. The patient understands and consents to the procedure.

CERVICAL EPIDURAL INJECTION

An interlaminar approach was performed on the right at C7-T1. A
inch 20 gauge epidural needle was advanced using loss-of-resistance
technique.

DIAGNOSTIC EPIDURAL INJECTION

Injection of Isovue-M 300 shows a good epidural pattern with spread
above and below the level of needle placement bilaterally. No
vascular opacification is seen.

THERAPEUTIC EPIDURAL INJECTION

1.5 ml of Kenalog 40 mixed with 2 ml of normal saline were then
instilled. The procedure was well-tolerated, and the patient was
discharged thirty minutes following the injection in good condition.
IMPRESSION: Technically successful interlaminar epidural injection on the right
at C7-T1.

## 2023-07-02 NOTE — Assessment & Plan Note (Signed)
 Arthritic changes noted.  Given bilateral injections today.  See if this does make improvement.  Do feel that we may need to consider the possibility of home exercises otherwise.  Discussed icing regimen in which activities to do and which ones to avoid.  Increase activity slowly.  Follow-up again in 6 to 8 weeks otherwise.

## 2023-07-02 NOTE — Assessment & Plan Note (Signed)
 Degenerative disc disease noted.  Discussed icing regimen and home exercises, discussed which activities to do and which ones to avoid.  Increase activity slowly over the course the next several weeks.  Discussed icing regimen.  Follow-up again in in 6 to 8 weeks.  Not the main component.  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

## 2023-07-02 NOTE — Patient Instructions (Addendum)
 Suzetrigine is the name of the drug Tried SI joint injections  Tried to get  a CD of the MRI when you have it and drop it off Consider the low dose naltraxone as well with Dr. Berline Chough  Keep me updated as well with everything!

## 2023-07-02 NOTE — Assessment & Plan Note (Signed)
 Status post ADT CF.  Is going to be seeing the neurosurgeon again and getting an MRI next week.  Would like to look at it and see if I see anything else that could be contributing.  I would advise discussed the possibility of sodium channel blockers as well as low-dose naltrexone as different potentials for him.

## 2023-07-03 NOTE — Telephone Encounter (Signed)
Forwarding to Dr Smith as FYI

## 2023-07-05 ENCOUNTER — Other Ambulatory Visit: Payer: Self-pay | Admitting: Internal Medicine

## 2023-07-05 DIAGNOSIS — K219 Gastro-esophageal reflux disease without esophagitis: Secondary | ICD-10-CM

## 2023-07-12 DIAGNOSIS — M47813 Spondylosis without myelopathy or radiculopathy, cervicothoracic region: Secondary | ICD-10-CM | POA: Diagnosis not present

## 2023-07-12 DIAGNOSIS — M47812 Spondylosis without myelopathy or radiculopathy, cervical region: Secondary | ICD-10-CM | POA: Diagnosis not present

## 2023-07-12 DIAGNOSIS — M5412 Radiculopathy, cervical region: Secondary | ICD-10-CM | POA: Diagnosis not present

## 2023-07-12 DIAGNOSIS — M50223 Other cervical disc displacement at C6-C7 level: Secondary | ICD-10-CM | POA: Diagnosis not present

## 2023-07-12 DIAGNOSIS — M5030 Other cervical disc degeneration, unspecified cervical region: Secondary | ICD-10-CM | POA: Diagnosis not present

## 2023-07-16 DIAGNOSIS — Z6830 Body mass index (BMI) 30.0-30.9, adult: Secondary | ICD-10-CM | POA: Diagnosis not present

## 2023-07-16 DIAGNOSIS — M4802 Spinal stenosis, cervical region: Secondary | ICD-10-CM | POA: Diagnosis not present

## 2023-07-17 ENCOUNTER — Encounter (HOSPITAL_BASED_OUTPATIENT_CLINIC_OR_DEPARTMENT_OTHER): Payer: Self-pay | Admitting: Pulmonary Disease

## 2023-07-18 MED ORDER — BREZTRI AEROSPHERE 160-9-4.8 MCG/ACT IN AERO: 2.0000 | INHALATION_SPRAY | Freq: Two times a day (BID) | RESPIRATORY_TRACT | 5 refills | Status: DC

## 2023-07-18 NOTE — Telephone Encounter (Signed)
**Note De-identified  [place] Obfuscation** Please advise 

## 2023-07-18 NOTE — Telephone Encounter (Signed)
 He is currently on Symbicort. Will be logical to switch to [facility] -2 puffs twice daily I have sent in prescription to his pharmacy

## 2023-07-24 ENCOUNTER — Encounter (HOSPITAL_BASED_OUTPATIENT_CLINIC_OR_DEPARTMENT_OTHER): Payer: Self-pay

## 2023-07-24 DIAGNOSIS — I719 Aortic aneurysm of unspecified site, without rupture: Secondary | ICD-10-CM

## 2023-07-30 ENCOUNTER — Other Ambulatory Visit: Payer: Self-pay | Admitting: Internal Medicine

## 2023-07-30 DIAGNOSIS — M5440 Lumbago with sciatica, unspecified side: Secondary | ICD-10-CM

## 2023-07-30 DIAGNOSIS — M5010 Cervical disc disorder with radiculopathy, unspecified cervical region: Secondary | ICD-10-CM

## 2023-08-01 ENCOUNTER — Other Ambulatory Visit: Payer: Self-pay

## 2023-08-01 ENCOUNTER — Other Ambulatory Visit: Payer: Self-pay | Admitting: Internal Medicine

## 2023-08-01 ENCOUNTER — Encounter: Payer: Self-pay | Admitting: Internal Medicine

## 2023-08-01 DIAGNOSIS — M5412 Radiculopathy, cervical region: Secondary | ICD-10-CM | POA: Diagnosis not present

## 2023-08-01 DIAGNOSIS — M4800 Spinal stenosis, site unspecified: Secondary | ICD-10-CM

## 2023-08-01 DIAGNOSIS — M150 Primary generalized (osteo)arthritis: Secondary | ICD-10-CM

## 2023-08-01 DIAGNOSIS — M461 Sacroiliitis, not elsewhere classified: Secondary | ICD-10-CM

## 2023-08-01 DIAGNOSIS — M19011 Primary osteoarthritis, right shoulder: Secondary | ICD-10-CM

## 2023-08-01 DIAGNOSIS — M5136 Other intervertebral disc degeneration, lumbar region with discogenic back pain only: Secondary | ICD-10-CM

## 2023-08-01 DIAGNOSIS — G8929 Other chronic pain: Secondary | ICD-10-CM

## 2023-08-01 MED ORDER — TRAMADOL HCL 50 MG PO TABS
50.0000 mg | ORAL_TABLET | Freq: Two times a day (BID) | ORAL | 1 refills | Status: DC
Start: 2023-08-01 — End: 2023-12-12

## 2023-08-16 ENCOUNTER — Other Ambulatory Visit: Payer: Self-pay | Admitting: Internal Medicine

## 2023-08-16 DIAGNOSIS — I480 Paroxysmal atrial fibrillation: Secondary | ICD-10-CM

## 2023-08-16 DIAGNOSIS — E785 Hyperlipidemia, unspecified: Secondary | ICD-10-CM

## 2023-08-16 DIAGNOSIS — I10 Essential (primary) hypertension: Secondary | ICD-10-CM

## 2023-08-20 ENCOUNTER — Telehealth: Payer: Self-pay | Admitting: Internal Medicine

## 2023-08-20 ENCOUNTER — Encounter (HOSPITAL_BASED_OUTPATIENT_CLINIC_OR_DEPARTMENT_OTHER): Payer: Self-pay | Admitting: Pulmonary Disease

## 2023-08-20 ENCOUNTER — Other Ambulatory Visit: Payer: Self-pay

## 2023-08-20 DIAGNOSIS — I7123 Aneurysm of the descending thoracic aorta, without rupture: Secondary | ICD-10-CM

## 2023-08-20 DIAGNOSIS — E785 Hyperlipidemia, unspecified: Secondary | ICD-10-CM

## 2023-08-20 DIAGNOSIS — I10 Essential (primary) hypertension: Secondary | ICD-10-CM

## 2023-08-20 DIAGNOSIS — I480 Paroxysmal atrial fibrillation: Secondary | ICD-10-CM

## 2023-08-20 MED ORDER — DILTIAZEM HCL ER COATED BEADS 120 MG PO CP24: ORAL_CAPSULE | ORAL | 0 refills | Status: DC

## 2023-08-20 MED ORDER — ROSUVASTATIN CALCIUM 20 MG PO TABS: ORAL_TABLET | ORAL | 0 refills | Status: DC

## 2023-08-20 NOTE — Telephone Encounter (Signed)
 Duplicate, ordered today 08/20/23

## 2023-08-20 NOTE — Telephone Encounter (Signed)
 Copied from CRM [id number]. Topic: Clinical - Medication Refill >> Aug 20, 2023 12:40 PM Dyann Glaser G wrote: Medication: rosuvastatin  (CRESTOR ) 20 MG, diltiazem  (CARDIZEM  CD) 120 MG 24 hr capsule  Has the patient contacted their pharmacy? Yes (Agent: If no, request that the patient contact the pharmacy for the refill. If patient does not wish to contact the pharmacy document the reason why and proceed with request.) (Agent: If yes, when and what did the pharmacy advise?)  This is the patient's preferred pharmacy:  [id number] - [place], [place] - [address] AT [address]NER OF [facility] [address] [place] [address] Phone: [phone] Fax: [phone]    Is this the correct pharmacy for this prescription? Yes If no, delete pharmacy and type the correct one.   Has the prescription been filled recently? Yes  Is the patient out of the medication? No  Has the patient been seen for an appointment in the last year OR does the patient have an upcoming appointment? Yes  Can we respond through MyChart? Yes  Agent: Please be advised that Rx refills may take up to 3 business days. We ask that you follow-up with your pharmacy.

## 2023-08-20 NOTE — Telephone Encounter (Signed)
 Can we help get him scheduled?

## 2023-08-21 NOTE — Telephone Encounter (Signed)
 Order placed

## 2023-08-24 NOTE — Telephone Encounter (Signed)
Can you schedule

## 2023-08-27 DIAGNOSIS — K080 Exfoliation of teeth due to systemic causes: Secondary | ICD-10-CM | POA: Diagnosis not present

## 2023-08-27 NOTE — Telephone Encounter (Signed)
 Please confirm for pt

## 2023-09-07 NOTE — Progress Notes (Signed)
 [facility] [address] [place] [address] Phone: [phone] Subjective:   John [name], am serving as a scribe for Dr. Ronnell Coins.  I'm seeing this patient by the request  of:  Arcadio Knuckles, MD  CC: Neck pain worsening  BJY:[id number]  07/02/2023 Arthritic changes noted.  Given bilateral injections today.  See if this does make improvement.  Do feel that we may need to consider the possibility of home exercises otherwise.  Discussed icing regimen in which activities to do and which ones to avoid.  Increase activity slowly.  Follow-up again in 6 to 8 weeks otherwise     Status post ADT CF.  Is going to be seeing the neurosurgeon again and getting an MRI next week.  Would like to look at it and see if I see anything else that could be contributing.  I would advise discussed the possibility of sodium channel blockers as well as low-dose naltrexone  as different potentials for him.     Degenerative disc disease noted.  Discussed icing regimen and home exercises, discussed which activities to do and which ones to avoid.  Increase activity slowly over the course the next several weeks.  Discussed icing regimen.  Follow-up again in in 6 to 8 weeks.  Not the main component.  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      Update 09/10/2023 John [name] is a 68 y.o. male coming in with complaint of cervical, lumbar, and SI joint pain. Patient states that he started taking Lyrica and it is no longer working. Patient said that he thumb, pointer and middle fingers in R hand are worse than prior to surgery. He also has sharp pain that will intermittently occur in the fingers as well. Entire arm and neck are painful in the evenings.        Past Medical History:  Diagnosis Date   Afib (HCC)    no issues since 2018/2019   Anxiety and depression    Asthma     Complication of anesthesia    blood pressure dropped when trying to have ACDF at surgery center- aborted surgery and re-scheduled   Emphysema of lung (HCC)    GERD (gastroesophageal reflux disease)    Hernia    right inguinal   History of bronchitis    History of kidney stones    pt reports kidney stone on scan   Hyperlipidemia    Hypertension    Morton's neuroma    OA (osteoarthritis)    neck   Obstructive sleep apnea on CPAP    not using CPAP currently   Pancreatitis    Torn meniscus    Left   Past Surgical History:  Procedure Laterality Date   ANTERIOR CERVICAL DECOMP/DISCECTOMY FUSION N/A 03/29/2023   Procedure: Anterior Cervical Decompression/Discectomy Fusion, Cervical Five-Cervical Six;  Surgeon: Van Gelinas, MD;  Location: [facility] OR;  Service: Neurosurgery;  Laterality: N/A;  3C   BONE CYST EXCISION  1991   left wrist   COLONOSCOPY     HERNIA REPAIR  04/14/2011   right inguinal   HERNIA REPAIR     left inguinal, december 2020   Social History   Socioeconomic History   Marital status: Divorced    Spouse name: Not on file   Number of children: Not on file   Years of education: Not on file  Highest education level: Not on file  Occupational History   Not on file  Tobacco Use   Smoking status: Former    Current packs/day: 0.00    Average packs/day: 1.5 packs/day for 38.0 years (57.0 ttl pk-yrs)    Types: Cigarettes    Start date: 56    Quit date: 2015    Years since quitting: 10.4   Smokeless tobacco: Never  Vaping Use   Vaping status: Every Day   Substances: Nicotine  Substance and Sexual Activity   Alcohol use: Not Currently   Drug use: Yes    Comment: Delta-8 THC gummies 2-3 times/week   Sexual activity: Yes    Partners: Female  Other Topics Concern   Not on file  Social History Narrative   Not on file   Social Drivers of Health   Financial Resource Strain: Not on file  Food Insecurity: Not on file  Transportation Needs: Not on file   Physical Activity: Not on file  Stress: Not on file  Social Connections: Not on file   Allergies  Allergen Reactions   Nsaids Other (See Comments)    Upsets colon when taken in the evening time   Aprindine Other (See Comments)    Unknown reaction   Gabapentin  Nausea And Vomiting and Palpitations    And severe anxiety and terrible nightmares    Family History  Problem Relation Age of Onset   Hypertension Mother    Dementia Other    Hypertension Sister    Hypertension Brother     Current Outpatient Medications (Endocrine & Metabolic):    methylPREDNISolone  (MEDROL  DOSEPAK) 4 MG TBPK tablet, 4 pills day 1, 3 pills day 2-3, 2 pills day 4-7, 1 pill day 8-10  Current Outpatient Medications (Cardiovascular):    carvedilol  (COREG ) 6.25 MG tablet, TAKE 1 TABLET BY MOUTH 2 TIMES DAILY WITH A MEAL.   diltiazem  (CARDIZEM  CD) 120 MG 24 hr capsule, TAKE 1 CAPSULE(120 MG) BY MOUTH DAILY   diltiazem  (CARDIZEM ) 30 MG tablet, TAKE 1 TABLET BY MOUTH EVERY 4 HOURS AS NEEDED HR ABOVE 100 AND TOP NUMBER B/P GREATER THAN 100   indapamide  (LOZOL ) 1.25 MG tablet, Take 1 tablet (1.25 mg total) by mouth daily.   olmesartan  (BENICAR ) 40 MG tablet, TAKE 1 TABLET BY MOUTH EVERY DAY   rosuvastatin  (CRESTOR ) 20 MG tablet, TAKE 1 TABLET BY MOUTH EVERY DAY   sildenafil  (VIAGRA ) 25 MG tablet, Take 1 tablet (25 mg total) by mouth daily as needed for erectile dysfunction.  Current Outpatient Medications (Respiratory):    albuterol  (VENTOLIN  HFA) 108 (90 Base) MCG/ACT inhaler, Inhale 2 puffs into the lungs every 4 (four) hours as needed for wheezing or shortness of breath.   budeson-glycopyrrolate-formoterol  (BREZTRI  AEROSPHERE) 160-9-4.8 MCG/ACT AERO inhaler, Inhale 2 puffs into the lungs in the morning and at bedtime.   fluticasone  (FLONASE ) 50 MCG/ACT nasal spray, Place 2 sprays into both nostrils in the morning and at bedtime. (Patient taking differently: Place 1 spray into both nostrils in the morning and at  bedtime.)   ipratropium (ATROVENT ) 0.06 % nasal spray, USE 1 SPRAY IN EACH NOSTRIL TWICE A DAY   montelukast  (SINGULAIR ) 10 MG tablet, TAKE 1 TABLET BY MOUTH EVERY DAY  Current Outpatient Medications (Analgesics):    Suzetrigine (JOURNAVX) 50 MG TABS, Take 50 mg by mouth in the morning and at bedtime.   traMADol  (ULTRAM ) 50 MG tablet, Take 1 tablet (50 mg total) by mouth 2 (two) times daily.  Current Outpatient Medications (Hematological):  Ferrous Sulfate (IRON) 28 MG TABS, Take 28 mg by mouth once a week.   vitamin B-12 (CYANOCOBALAMIN ) 250 MCG tablet, Take 250 mcg by mouth daily.  Current Outpatient Medications (Other):    CHELATED MAGNESIUM  PO, Take 200 mg by mouth in the morning.   cholecalciferol (VITAMIN D3) 25 MCG (1000 UT) tablet, Take 1,000 Units by mouth every evening.   Coenzyme Q10 (COQ10) 200 MG CAPS, Take 200 mg by mouth daily.   DULoxetine  (CYMBALTA ) 20 MG capsule, Take 1 capsule (20 mg total) by mouth daily.   esomeprazole  (NEXIUM ) 20 MG capsule, TAKE 1 CAPSULE (20 MG TOTAL) BY MOUTH DAILY AT 12 NOON.   Flaxseed, Linseed, (FLAX SEED OIL) 1000 MG CAPS, Take 1,000 mg by mouth daily.   Menaquinone-7 (K2 PO), Take 200 mg by mouth daily.   Multiple Vitamins-Minerals (MULTIVITAMIN WITH MINERALS) tablet, Take 1 tablet by mouth in the morning.   Omega-3 Fatty Acids (FISH OIL) 1000 MG CAPS, Take 1,000 mg by mouth 2 (two) times daily.   OVER THE COUNTER MEDICATION, Take 1 tablet by mouth 2 (two) times daily. Prosta-Metto supplement   OVER THE COUNTER MEDICATION, Take 1 tablet by mouth every evening. Herba vision gold supplement   POTASSIUM PO, Take 50 mg by mouth daily.   Prasterone, DHEA, (DHEA 50 PO), Take 100 mg by mouth daily.   simethicone (MYLICON) 125 MG chewable tablet, Chew 125 mg by mouth every 6 (six) hours as needed for flatulence.   TURMERIC CURCUMIN PO, Take 800 mg by mouth 2 (two) times daily.   Vitamin D , Ergocalciferol , (DRISDOL ) 1.25 MG (50000 UNIT) CAPS  capsule, Take 1 capsule (50,000 Units total) by mouth every 7 (seven) days.   vitamin E 180 MG (400 UNITS) capsule, Take 400 Units by mouth daily.   Reviewed prior external information including notes and imaging from  primary care provider As well as notes that were available from care everywhere and other healthcare systems.  Past medical history, social, surgical and family history all reviewed in electronic medical record.  No pertanent information unless stated regarding to the chief complaint.   Review of Systems:  No headache, visual changes, nausea, vomiting, diarrhea, constipation, dizziness, abdominal pain, skin rash, fevers, chills, night sweats, weight loss, swollen lymph nodes, body aches, joint swelling, chest pain, shortness of breath, mood changes. POSITIVE muscle aches  Objective  Blood pressure 128/86, pulse 85, height 5\' 11"  (1.803 m), weight 219 lb (99.3 kg), SpO2 95%.   General: No apparent distress alert and oriented x3 mood and affect normal, dressed appropriately.  HEENT: Pupils equal, extraocular movements intact  Respiratory: Patient's speak in full sentences and does not appear short of breath  Cardiovascular: No lower extremity edema, non tender, no erythema  Neck exam does have some limited range of motion.  Seems to have a fullness noted.  Diffuse tenderness anteriorly and posteriorly.  Grip strength may be some very mild weakness with 4 out of 5 strength on the right side of the hand.  Some mild thenar eminence wasting noted grip strength also moderately weaker on the right than the left    Impression and Recommendations:    The above documentation has been reviewed and is accurate and complete Shaunika Italiano M Zaydn Gutridge, DO

## 2023-09-10 ENCOUNTER — Encounter: Payer: Self-pay | Admitting: Family Medicine

## 2023-09-10 ENCOUNTER — Other Ambulatory Visit: Payer: Self-pay

## 2023-09-10 ENCOUNTER — Ambulatory Visit: Admitting: Family Medicine

## 2023-09-10 ENCOUNTER — Other Ambulatory Visit: Payer: Self-pay | Admitting: Family Medicine

## 2023-09-10 VITALS — BP 128/86 | HR 85 | Ht 71.0 in | Wt 219.0 lb

## 2023-09-10 DIAGNOSIS — M5010 Cervical disc disorder with radiculopathy, unspecified cervical region: Secondary | ICD-10-CM | POA: Diagnosis not present

## 2023-09-10 MED ORDER — JOURNAVX 50 MG PO TABS: 50.0000 mg | ORAL_TABLET | Freq: Two times a day (BID) | ORAL | 0 refills | Status: DC

## 2023-09-10 MED ORDER — METHYLPREDNISOLONE 4 MG PO TBPK: ORAL_TABLET | ORAL | 0 refills | Status: DC

## 2023-09-10 NOTE — Patient Instructions (Signed)
 Referral Dr. Faylene Hoots EMG Medrol  dose pak See me in 7-10 days  Journevax

## 2023-09-10 NOTE — Assessment & Plan Note (Addendum)
 Status post surgical intervention that unfortunately patient feels like he is worsening.  Patient does bring in an MRI today and does show that he is healing potential mild slowness on the progression.  Patient will be referred to discuss second opinion and what else can be done because it is affecting his daily activities, and his nighttime pain, as well as his job performance.  I do feel at this point we do need to consider the possibility of also a nerve conduction test to make sure we are not missing the other be contributing.  Patient is in agreement with this plan.  Discussed icing regimen and home exercises, discussed with patient another epidural but will do a Medrol  Dosepak.  Follow-up again in 6 to 8 weeks otherwise.  Prescription for Journavax given to see if a peripheral sodium channel blocker would make any difference with the hand pain and arm pain he is having

## 2023-09-11 ENCOUNTER — Other Ambulatory Visit: Payer: Self-pay | Admitting: Family Medicine

## 2023-09-18 ENCOUNTER — Encounter: Payer: Self-pay | Admitting: Neurology

## 2023-09-18 NOTE — Telephone Encounter (Signed)
 In ref to Journavax: Per Alisa App at [facility], medication has been approved. 09/18/23-09/17/24 REF # [id number]  (An approval letter will be faxed as well.)

## 2023-09-19 ENCOUNTER — Other Ambulatory Visit: Payer: Self-pay

## 2023-09-19 DIAGNOSIS — R202 Paresthesia of skin: Secondary | ICD-10-CM

## 2023-09-26 ENCOUNTER — Other Ambulatory Visit: Payer: Self-pay | Admitting: Internal Medicine

## 2023-09-26 DIAGNOSIS — J309 Allergic rhinitis, unspecified: Secondary | ICD-10-CM

## 2023-10-10 ENCOUNTER — Other Ambulatory Visit: Payer: Self-pay | Admitting: Internal Medicine

## 2023-10-10 DIAGNOSIS — K219 Gastro-esophageal reflux disease without esophagitis: Secondary | ICD-10-CM

## 2023-10-15 ENCOUNTER — Other Ambulatory Visit: Payer: Self-pay

## 2023-10-15 DIAGNOSIS — M542 Cervicalgia: Secondary | ICD-10-CM

## 2023-10-15 DIAGNOSIS — Z6830 Body mass index (BMI) 30.0-30.9, adult: Secondary | ICD-10-CM | POA: Diagnosis not present

## 2023-10-15 DIAGNOSIS — M5412 Radiculopathy, cervical region: Secondary | ICD-10-CM | POA: Diagnosis not present

## 2023-10-16 ENCOUNTER — Other Ambulatory Visit: Payer: Self-pay

## 2023-10-16 DIAGNOSIS — M79601 Pain in right arm: Secondary | ICD-10-CM

## 2023-10-18 ENCOUNTER — Other Ambulatory Visit: Payer: Self-pay | Admitting: Internal Medicine

## 2023-10-18 DIAGNOSIS — I10 Essential (primary) hypertension: Secondary | ICD-10-CM

## 2023-10-20 ENCOUNTER — Encounter: Payer: Self-pay | Admitting: Internal Medicine

## 2023-10-26 NOTE — Discharge Instructions (Signed)

## 2023-10-29 ENCOUNTER — Ambulatory Visit: Payer: Self-pay | Admitting: Family Medicine

## 2023-10-29 ENCOUNTER — Ambulatory Visit (HOSPITAL_COMMUNITY)
Admission: RE | Admit: 2023-10-29 | Discharge: 2023-10-29 | Disposition: A | Discharge status: Home / Self Care | Source: Ambulatory Visit | Attending: Pulmonary Disease | Admitting: Pulmonary Disease

## 2023-10-29 ENCOUNTER — Ambulatory Visit: Admitting: Neurology

## 2023-10-29 ENCOUNTER — Ambulatory Visit
Admission: RE | Admit: 2023-10-29 | Discharge: 2023-10-29 | Disposition: A | Discharge status: Home / Self Care | Source: Ambulatory Visit | Attending: Family Medicine | Admitting: Family Medicine

## 2023-10-29 DIAGNOSIS — R202 Paresthesia of skin: Secondary | ICD-10-CM

## 2023-10-29 DIAGNOSIS — I7123 Aneurysm of the descending thoracic aorta, without rupture: Secondary | ICD-10-CM | POA: Insufficient documentation

## 2023-10-29 DIAGNOSIS — Z981 Arthrodesis status: Secondary | ICD-10-CM | POA: Diagnosis not present

## 2023-10-29 DIAGNOSIS — M542 Cervicalgia: Secondary | ICD-10-CM

## 2023-10-29 DIAGNOSIS — G5603 Carpal tunnel syndrome, bilateral upper limbs: Secondary | ICD-10-CM

## 2023-10-29 DIAGNOSIS — I700 Atherosclerosis of aorta: Secondary | ICD-10-CM | POA: Diagnosis not present

## 2023-10-29 DIAGNOSIS — K449 Diaphragmatic hernia without obstruction or gangrene: Secondary | ICD-10-CM | POA: Diagnosis not present

## 2023-10-29 DIAGNOSIS — M4722 Other spondylosis with radiculopathy, cervical region: Secondary | ICD-10-CM | POA: Diagnosis not present

## 2023-10-29 MED ORDER — IOHEXOL 350 MG/ML SOLN
100.0000 mL | Freq: Once | INTRAVENOUS | Status: AC | PRN
  Administered 2023-10-29: 100 mL via INTRAVENOUS

## 2023-10-29 MED ORDER — SODIUM CHLORIDE (PF) 0.9 % IJ SOLN
INTRAMUSCULAR | Status: AC
  Filled 2023-10-29: qty 50

## 2023-10-29 MED ORDER — IOPAMIDOL (ISOVUE-M 300) INJECTION 61%
1.0000 mL | Freq: Once | INTRAMUSCULAR | Status: AC
Start: 2023-10-29 — End: 2023-10-29
  Administered 2023-10-29: 1 mL via EPIDURAL

## 2023-10-29 MED ORDER — TRIAMCINOLONE ACETONIDE 40 MG/ML IJ SUSP (RADIOLOGY)
60.0000 mg | Freq: Once | INTRAMUSCULAR | Status: AC
  Administered 2023-10-29: 60 mg via EPIDURAL

## 2023-10-29 NOTE — Procedures (Signed)
 [facility]  [address], [address]  [place], [place] [address] Tel: [phone] Fax: [phone] Test Date:  10/29/2023  Patient: John [name] DOB: 1956-03-22 Physician: Venetia Potters, MD  Sex: Male Height: 5' 11 Ref Phys: Arthea Sharps, DO  ID#: [id number]   Technician:    History: This is a 68 year old male with bilateral arm pain.  NCV & EMG Findings: Extensive electrodiagnostic evaluation of bilateral upper limbs shows: Right median sensory response is absent. Left median sensory response shows prolonged distal peak latency (4.0 ms) and reduced amplitude (8 V). Left median-ulnar palmar sensory responses show prolonged distal peak latency (Median Palm-Wrist, 2.6 ms) and abnormal peak latency difference ((Median Palm-Wrist)-(Ulnar Palm-Wrist), 0.70 ms).  Bilateral ulnar and radial sensory responses are within normal limits. Bilateral median (APB) and ulnar (ADM) motor responses are within normal limits. Chronic motor axon loss changes without accompanying active denervation changes are seen in bilateral abductor pollicis brevis muscles.  Impression: This is an abnormal study. The findings are most consistent with the following: Evidence of bilateral median mononeuropathy at or distal to the wrist, consistent with carpal tunnel syndrome. The findings are severe in degree electrically on the right and mild in degree electrically on the left. No electrodiagnostic evidence of a right or left cervical (C5-T1) motor radiculopathy. Screening studies for right or left ulnar or radial mononeuropathies are normal.    ___________________________ Venetia Potters, MD    Nerve Conduction Studies Motor Nerve Results    Latency Amplitude F-Lat Segment Distance CV Comment  Site (ms) Norm (mV) Norm (ms)  (cm) (m/s) Norm   Left Median (APB) Motor  Wrist 3.4  < 4.0 5.4  > 5.0        Elbow 9.3 - 4.4 -  Elbow-Wrist 30.5 52  > 50   Right Median (APB) Motor  Wrist 2.9  < 4.0 5.6  >  5.0        Elbow 9.0 - 5.1 -  Elbow-Wrist 31 51  > 50   Left Ulnar (ADM) Motor  Wrist 2.1  < 3.1 8.5  > 7.0        Bel elbow 6.3 - 7.7 -  Bel elbow-Wrist 23.5 56  > 50   Ab elbow 8.0 - 7.3 -  Ab elbow-Bel elbow 10 59 -   Right Ulnar (ADM) Motor  Wrist 1.90  < 3.1 8.3  > 7.0        Bel elbow 6.5 - 7.2 -  Bel elbow-Wrist 24 52  > 50   Ab elbow 8.3 - 7.0 -  Ab elbow-Bel elbow 10 56 -    Sensory Sites    Neg Peak Lat Amplitude (O-P) Segment Distance Velocity Comment  Site (ms) Norm (V) Norm  (cm) (ms)   Left Median Sensory  Wrist-Dig II *4.0  < 3.8 *8  > 10 Wrist-Dig II 13    Right Median Sensory  Wrist-Dig II *NR  < 3.8 *NR  > 10 Wrist-Dig II 13    Left Median-Ulnar Palmar Sensory       Median  Palm-Wrist *2.6  < 2.2 19  > 10 Palm-Wrist 8         Ulnar  Palm-Wrist 1.90  < 2.2 14  > 5 Palm-Wrist 8    Left Radial Sensory  Forearm-Wrist 2.2  < 2.8 12  > 10 Forearm-Wrist 10    Right Radial Sensory  Forearm-Wrist 2.2  < 2.8 11  > 10 Forearm-Wrist 10  Left Ulnar Sensory  Wrist-Dig V 2.9  < 3.2 7  > 5 Wrist-Dig V 11    Right Ulnar Sensory  Wrist-Dig V 2.7  < 3.2 13  > 5 Wrist-Dig V 11     Inter-Nerve Comparisons   Nerve 1 Value 1 Nerve 2 Value 2 Parameter Result Normal  Sensory Sites  L Median Palm-Wrist 2.6 ms L Ulnar Palm-Wrist 1.90 ms Peak Lat Diff *0.70 ms <0.40   Electromyography   Side Muscle Ins.Act Fibs Fasc Recrt Amp Dur Poly Activation Comment  Right FDI Nml Nml Nml Nml Nml Nml Nml Nml N/A  Right EIP Nml Nml Nml Nml Nml Nml Nml Nml N/A  Right Pronator teres Nml Nml Nml Nml Nml Nml Nml Nml N/A  Right APB *1+ Nml Nml *3- Nml Nml Nml Nml N/A  Right Biceps Nml Nml Nml Nml Nml Nml Nml Nml N/A  Right Triceps Nml Nml Nml Nml Nml Nml Nml Nml N/A  Right Deltoid Nml Nml Nml Nml Nml Nml Nml Nml N/A  Left FDI Nml Nml Nml Nml Nml Nml Nml Nml N/A  Left EIP Nml Nml Nml Nml Nml Nml Nml Nml N/A  Left Pronator teres Nml Nml Nml Nml Nml Nml Nml Nml N/A  Left APB Nml Nml Nml *2-  *1+ *1+ *1+ Nml N/A  Left Biceps Nml Nml Nml Nml Nml Nml Nml Nml N/A  Left Triceps Nml Nml Nml Nml Nml Nml Nml Nml N/A  Left Deltoid Nml Nml Nml Nml Nml Nml Nml Nml N/A      Waveforms:  Motor           Sensory

## 2023-10-31 ENCOUNTER — Ambulatory Visit (HOSPITAL_BASED_OUTPATIENT_CLINIC_OR_DEPARTMENT_OTHER): Payer: Self-pay | Admitting: Family

## 2023-11-02 ENCOUNTER — Encounter (HOSPITAL_BASED_OUTPATIENT_CLINIC_OR_DEPARTMENT_OTHER): Payer: Self-pay | Admitting: Pulmonary Disease

## 2023-11-02 NOTE — Progress Notes (Signed)
 [facility] [address] [place] [address] Phone: [phone] Subjective:   LILLETTE Berwyn Posey, am serving as a scribe for Dr. Arthea Claudene.  I'm seeing this patient by the request  of:  [name], MD  CC: Neck pain follow-up  YEP:Dlagzrupcz  09/10/2023 Status post surgical intervention that unfortunately patient feels like he is worsening.  Patient does bring in an MRI today and does show that he is healing potential mild slowness on the progression.  Patient will be referred to discuss second opinion and what else can be done because it is affecting his daily activities, and his nighttime pain, as well as his job performance.  I do feel at this point we do need to consider the possibility of also a nerve conduction test to make sure we are not missing the other be contributing.  Patient is in agreement with this plan.  Discussed icing regimen and home exercises, discussed with patient another epidural but will do a Medrol  Dosepak.  Follow-up again in 6 to 8 weeks otherwise.  Prescription for Journavax given to see if a peripheral sodium channel blocker would make any difference with the hand pain and arm pain he is having      Update 11/05/2023 PEARLEY BARANEK is a 68 y.o. male coming in with complaint of cervical spine pain.  Has had surgery recently.  Also had an epidural of the neck July 21.  Patient states that he is doing a lot better. No pain. Second and third fingers are numb on R hand.   Fell on R elbow 3 weeks ago. Bursae swelling is decreasing but still present.      Past Medical History:  Diagnosis Date   Afib (HCC)    no issues since 2018/2019   Anxiety and depression    Asthma    Complication of anesthesia    blood pressure dropped when trying to have ACDF at surgery center- aborted surgery and re-scheduled   Emphysema of lung (HCC)    GERD (gastroesophageal reflux disease)    Hernia    right inguinal   History of bronchitis     History of kidney stones    pt reports kidney stone on scan   Hyperlipidemia    Hypertension    Morton's neuroma    OA (osteoarthritis)    neck   Obstructive sleep apnea on CPAP    not using CPAP currently   Pancreatitis    Torn meniscus    Left   Past Surgical History:  Procedure Laterality Date   ANTERIOR CERVICAL DECOMP/DISCECTOMY FUSION N/A 03/29/2023   Procedure: Anterior Cervical Decompression/Discectomy Fusion, Cervical Five-Cervical Six;  Surgeon: Debby Dorn MATSU, MD;  Location: [facility] OR;  Service: Neurosurgery;  Laterality: N/A;  3C   BONE CYST EXCISION  1991   left wrist   COLONOSCOPY     HERNIA REPAIR  04/14/2011   right inguinal   HERNIA REPAIR     left inguinal, december 2020   Social History   Socioeconomic History   Marital status: Divorced    Spouse name: Not on file   Number of children: Not on file   Years of education: Not on file   Highest education level: Not on file  Occupational History   Not on file  Tobacco Use   Smoking status: Former    Current packs/day: 0.00    Average packs/day: 1.5 packs/day for 38.0 years (57.0 ttl pk-yrs)    Types:  Cigarettes    Start date: 57    Quit date: 2015    Years since quitting: 10.5   Smokeless tobacco: Never  Vaping Use   Vaping status: Every Day   Substances: Nicotine  Substance and Sexual Activity   Alcohol use: Not Currently   Drug use: Yes    Comment: Delta-8 THC gummies 2-3 times/week   Sexual activity: Yes    Partners: Female  Other Topics Concern   Not on file  Social History Narrative   Not on file   Social Drivers of Health   Financial Resource Strain: Not on file  Food Insecurity: Not on file  Transportation Needs: Not on file  Physical Activity: Not on file  Stress: Not on file  Social Connections: Not on file   Allergies  Allergen Reactions   Nsaids Other (See Comments)    Upsets colon when taken in the evening time   Aprindine Other (See Comments)    Unknown reaction    Gabapentin  Nausea And Vomiting and Palpitations    And severe anxiety and terrible nightmares    Family History  Problem Relation Age of Onset   Hypertension Mother    Dementia Other    Hypertension Sister    Hypertension Brother     Current Outpatient Medications (Endocrine & Metabolic):    methylPREDNISolone  (MEDROL  DOSEPAK) 4 MG TBPK tablet, 4 pills day 1, 3 pills day 2-3, 2 pills day 4-7, 1 pill day 8-10  Current Outpatient Medications (Cardiovascular):    carvedilol  (COREG ) 6.25 MG tablet, TAKE 1 TABLET BY MOUTH 2 TIMES DAILY WITH A MEAL.   diltiazem  (CARDIZEM  CD) 120 MG 24 hr capsule, TAKE 1 CAPSULE(120 MG) BY MOUTH DAILY   diltiazem  (CARDIZEM ) 30 MG tablet, TAKE 1 TABLET BY MOUTH EVERY 4 HOURS AS NEEDED HR ABOVE 100 AND TOP NUMBER B/P GREATER THAN 100   indapamide  (LOZOL ) 1.25 MG tablet, Take 1 tablet (1.25 mg total) by mouth daily.   olmesartan  (BENICAR ) 40 MG tablet, TAKE 1 TABLET BY MOUTH EVERY DAY   rosuvastatin  (CRESTOR ) 20 MG tablet, TAKE 1 TABLET BY MOUTH EVERY DAY   sildenafil  (VIAGRA ) 25 MG tablet, Take 1 tablet (25 mg total) by mouth daily as needed for erectile dysfunction.  Current Outpatient Medications (Respiratory):    albuterol  (VENTOLIN  HFA) 108 (90 Base) MCG/ACT inhaler, Inhale 2 puffs into the lungs every 4 (four) hours as needed for wheezing or shortness of breath.   budeson-glycopyrrolate-formoterol  (BREZTRI  AEROSPHERE) 160-9-4.8 MCG/ACT AERO inhaler, Inhale 2 puffs into the lungs in the morning and at bedtime.   fluticasone  (FLONASE ) 50 MCG/ACT nasal spray, Place 2 sprays into both nostrils in the morning and at bedtime. (Patient taking differently: Place 1 spray into both nostrils in the morning and at bedtime.)   ipratropium (ATROVENT ) 0.06 % nasal spray, USE 1 SPRAY IN EACH NOSTRIL TWICE A DAY   montelukast  (SINGULAIR ) 10 MG tablet, TAKE 1 TABLET BY MOUTH EVERY DAY  Current Outpatient Medications (Analgesics):    naproxen  (NAPROSYN ) 500 MG tablet, TAKE 1  TABLET BY MOUTH TWICE A DAY AS NEEDED   Suzetrigine  (JOURNAVX ) 50 MG TABS, Take 50 mg by mouth in the morning and at bedtime.   traMADol  (ULTRAM ) 50 MG tablet, Take 1 tablet (50 mg total) by mouth 2 (two) times daily.  Current Outpatient Medications (Hematological):    Ferrous Sulfate (IRON) 28 MG TABS, Take 28 mg by mouth once a week.   vitamin B-12 (CYANOCOBALAMIN ) 250 MCG tablet, Take 250 mcg  by mouth daily.  Current Outpatient Medications (Other):    CHELATED MAGNESIUM  PO, Take 200 mg by mouth in the morning.   cholecalciferol (VITAMIN D3) 25 MCG (1000 UT) tablet, Take 1,000 Units by mouth every evening.   Coenzyme Q10 (COQ10) 200 MG CAPS, Take 200 mg by mouth daily.   DULoxetine  (CYMBALTA ) 20 MG capsule, Take 1 capsule (20 mg total) by mouth daily.   esomeprazole  (NEXIUM ) 20 MG capsule, TAKE 1 CAPSULE (20 MG TOTAL) BY MOUTH DAILY AT 12 NOON.   Flaxseed, Linseed, (FLAX SEED OIL) 1000 MG CAPS, Take 1,000 mg by mouth daily.   Menaquinone-7 (K2 PO), Take 200 mg by mouth daily.   Multiple Vitamins-Minerals (MULTIVITAMIN WITH MINERALS) tablet, Take 1 tablet by mouth in the morning.   Omega-3 Fatty Acids (FISH OIL) 1000 MG CAPS, Take 1,000 mg by mouth 2 (two) times daily.   OVER THE COUNTER MEDICATION, Take 1 tablet by mouth 2 (two) times daily. Prosta-Metto supplement   OVER THE COUNTER MEDICATION, Take 1 tablet by mouth every evening. Herba vision gold supplement   POTASSIUM PO, Take 50 mg by mouth daily.   Prasterone, DHEA, (DHEA 50 PO), Take 100 mg by mouth daily.   simethicone (MYLICON) 125 MG chewable tablet, Chew 125 mg by mouth every 6 (six) hours as needed for flatulence.   TURMERIC CURCUMIN PO, Take 800 mg by mouth 2 (two) times daily.   Vitamin D , Ergocalciferol , (DRISDOL ) 1.25 MG (50000 UNIT) CAPS capsule, Take 1 capsule (50,000 Units total) by mouth every 7 (seven) days.   vitamin E 180 MG (400 UNITS) capsule, Take 400 Units by mouth daily.   Reviewed prior external information  including notes and imaging from  primary care provider As well as notes that were available from care everywhere and other healthcare systems.  Past medical history, social, surgical and family history all reviewed in electronic medical record.  No pertanent information unless stated regarding to the chief complaint.   Review of Systems:  No headache, visual changes, nausea, vomiting, diarrhea, constipation, dizziness, abdominal pain, skin rash, fevers, chills, night sweats, weight loss, swollen lymph nodes, body aches, joint swelling, chest pain, shortness of breath, mood changes. POSITIVE muscle aches  Objective  Blood pressure (!) 140/82, pulse 77, height 5' 11 (1.803 m), weight 220 lb (99.8 kg), SpO2 98%.   General: No apparent distress alert and oriented x3 mood and affect normal, dressed appropriately.  HEENT: Pupils equal, extraocular movements intact  Respiratory: Patient's speak in full sentences and does not appear short of breath  Cardiovascular: No lower extremity edema, non tender, no erythema  Neck exam has some improvement in range of motion.  Patient actually has more improvement in rotation as well as sidebending.  Seems much more comfortable. Wrist exam does have a positive Tinel's on the right side.  No thenar eminence wasting noted. Procedure: Real-time Ultrasound Guided Injection of right carpal tunnel Device: GE Logiq Q7  Ultrasound guided injection is preferred based studies that show increased duration, increased effect, greater accuracy, decreased procedural pain, increased response rate with ultrasound guided versus blind injection.  Verbal informed consent obtained.  Time-out conducted.  Noted no overlying erythema, induration, or other signs of local infection.  Skin prepped in a sterile fashion.  Local anesthesia: Topical Ethyl chloride.  With sterile technique and under real time ultrasound guidance:  median nerve visualized.  23g 5/8 inch needle inserted  distal to proximal approach into nerve sheath. Pictures taken nfor needle placement. Patient did  have injection of 0.5 cc of 0.5% Marcaine, and  0.5 cc of Kenalog  40 mg/dL. Completed without difficulty  Pain immediately resolved suggesting accurate placement of the medication.  Advised to call if fevers/chills, erythema, induration, drainage, or persistent bleeding.  Images saved but did not appear to stay in the chart. Impression: Technically successful ultrasound guided injection.    Impression and Recommendations:     The above documentation has been reviewed and is accurate and complete Quintrell Baze M Austin Herd, DO

## 2023-11-05 ENCOUNTER — Ambulatory Visit: Admitting: Family Medicine

## 2023-11-05 ENCOUNTER — Encounter: Payer: Self-pay | Admitting: Family Medicine

## 2023-11-05 VITALS — BP 140/82 | HR 77 | Ht 71.0 in | Wt 220.0 lb

## 2023-11-05 DIAGNOSIS — G5601 Carpal tunnel syndrome, right upper limb: Secondary | ICD-10-CM | POA: Insufficient documentation

## 2023-11-05 DIAGNOSIS — M5010 Cervical disc disorder with radiculopathy, unspecified cervical region: Secondary | ICD-10-CM | POA: Diagnosis not present

## 2023-11-05 NOTE — Patient Instructions (Addendum)
 Injected wrist today Will likely need surgery for wrist Ok to titrate off Lyrica See me in 3 months

## 2023-11-05 NOTE — Assessment & Plan Note (Signed)
 Significant improvement after the epidural.  Very happy that this is making a difference for him.  Discussed icing regimen of home exercises, increase activity slowly.  Follow-up again 3 months.

## 2023-11-05 NOTE — Assessment & Plan Note (Signed)
 Injection given today, tolerated the procedure well.  Discussed with patient that this is diagnostic as well as therapeutic and we will see how much this does help patient with the symptoms.  Depending on this will also help us  delineate how much is from the neck of her so much of it is from the wrist.  Patient knows and will consider the possibility of surgical intervention for the carpal tunnel at some point with the severity noted on the nerve conduction test.  Would like to get through the season where he does a lot of yard work.  Follow-up with me again in 3 months

## 2023-11-06 NOTE — Telephone Encounter (Signed)
 Can you look at this but previous scan when I looked does show this and most recent scan does not

## 2023-11-18 ENCOUNTER — Other Ambulatory Visit: Payer: Self-pay | Admitting: Internal Medicine

## 2023-11-18 DIAGNOSIS — I10 Essential (primary) hypertension: Secondary | ICD-10-CM

## 2023-11-18 DIAGNOSIS — I480 Paroxysmal atrial fibrillation: Secondary | ICD-10-CM

## 2023-11-18 DIAGNOSIS — E785 Hyperlipidemia, unspecified: Secondary | ICD-10-CM

## 2023-11-26 ENCOUNTER — Ambulatory Visit: Payer: Self-pay | Admitting: Internal Medicine

## 2023-11-26 ENCOUNTER — Encounter: Payer: Self-pay | Admitting: Internal Medicine

## 2023-11-26 ENCOUNTER — Ambulatory Visit: Payer: Medicare Other | Admitting: Internal Medicine

## 2023-11-26 VITALS — BP 136/86 | HR 88 | Temp 98.7°F | Resp 16 | Ht 71.0 in | Wt 213.6 lb

## 2023-11-26 DIAGNOSIS — E538 Deficiency of other specified B group vitamins: Secondary | ICD-10-CM | POA: Insufficient documentation

## 2023-11-26 DIAGNOSIS — E559 Vitamin D deficiency, unspecified: Secondary | ICD-10-CM | POA: Insufficient documentation

## 2023-11-26 DIAGNOSIS — Z720 Tobacco use: Secondary | ICD-10-CM | POA: Insufficient documentation

## 2023-11-26 DIAGNOSIS — K219 Gastro-esophageal reflux disease without esophagitis: Secondary | ICD-10-CM | POA: Diagnosis not present

## 2023-11-26 DIAGNOSIS — N400 Enlarged prostate without lower urinary tract symptoms: Secondary | ICD-10-CM

## 2023-11-26 DIAGNOSIS — I10 Essential (primary) hypertension: Secondary | ICD-10-CM

## 2023-11-26 DIAGNOSIS — R739 Hyperglycemia, unspecified: Secondary | ICD-10-CM | POA: Diagnosis not present

## 2023-11-26 DIAGNOSIS — R0609 Other forms of dyspnea: Secondary | ICD-10-CM

## 2023-11-26 DIAGNOSIS — I5032 Chronic diastolic (congestive) heart failure: Secondary | ICD-10-CM | POA: Diagnosis not present

## 2023-11-26 DIAGNOSIS — I480 Paroxysmal atrial fibrillation: Secondary | ICD-10-CM

## 2023-11-26 DIAGNOSIS — F339 Major depressive disorder, recurrent, unspecified: Secondary | ICD-10-CM | POA: Insufficient documentation

## 2023-11-26 LAB — BASIC METABOLIC PANEL WITH GFR
BUN: 21 mg/dL (ref 6–23)
CO2: 30 meq/L (ref 19–32)
Calcium: 9.3 mg/dL (ref 8.4–10.5)
Chloride: 103 meq/L (ref 96–112)
Creatinine, Ser: 1.05 mg/dL (ref 0.40–1.50)
GFR: 73.96 mL/min (ref >60.00)
Glucose, Bld: 96 mg/dL (ref 70–99)
Potassium: 4.4 meq/L (ref 3.5–5.1)
Sodium: 140 meq/L (ref 135–145)

## 2023-11-26 LAB — TROPONIN I (HIGH SENSITIVITY): High Sens Troponin I: 3 ng/L (ref 2–17)

## 2023-11-26 LAB — CBC WITH DIFFERENTIAL/PLATELET
Basophils Absolute: 0 K/uL (ref 0.0–0.1)
Basophils Relative: 0.4 % (ref 0.0–3.0)
Eosinophils Absolute: 0.2 K/uL (ref 0.0–0.7)
Eosinophils Relative: 4.2 % (ref 0.0–5.0)
HCT: 40.7 % (ref 39.0–52.0)
Hemoglobin: 14 g/dL (ref 13.0–17.0)
Lymphocytes Relative: 23 % (ref 12.0–46.0)
Lymphs Abs: 1.1 K/uL (ref 0.7–4.0)
MCHC: 34.3 g/dL (ref 30.0–36.0)
MCV: 94.2 fl (ref 78.0–100.0)
Monocytes Absolute: 0.5 K/uL (ref 0.1–1.0)
Monocytes Relative: 11.4 % (ref 3.0–12.0)
Neutro Abs: 2.9 K/uL (ref 1.4–7.7)
Neutrophils Relative %: 61 % (ref 43.0–77.0)
Platelets: 156 K/uL (ref 150.0–400.0)
RBC: 4.32 Mil/uL (ref 4.22–5.81)
RDW: 13.6 % (ref 11.5–15.5)
WBC: 4.7 K/uL (ref 4.0–10.5)

## 2023-11-26 LAB — BRAIN NATRIURETIC PEPTIDE: Pro B Natriuretic peptide (BNP): 8 pg/mL (ref 0.0–100.0)

## 2023-11-26 LAB — PSA: PSA: 0.66 ng/mL (ref 0.10–4.00)

## 2023-11-26 LAB — HEMOGLOBIN A1C: Hgb A1c MFr Bld: 6.1 % (ref 4.6–6.5)

## 2023-11-26 NOTE — Progress Notes (Unsigned)
 Subjective:  Patient ID: John [name], male    DOB: February 15, 1956  Age: 68 y.o. MRN: [id number]  CC: Hypertension   HPI -  Discussed the use of AI scribe software for clinical note transcription with the patient, who gave verbal consent to proceed.  History of Present Illness John [name] is a 68 year old male who presents for a follow-up visit.  He experiences fatigue during activities, describing a sensation of feeling 'very tired, hot, and old'. No chest pain or shortness of breath, except for a past episode related to Lyrica (pregabalin) use, which he has since discontinued. No symptoms of atrial fibrillation or irregular heartbeats.  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 Lyrica provided minimal relief and caused shortness of breath, leading to its discontinuation after a month. John [name] was also tried but was costly and caused adverse effects. Currently, he finds tramadol  and Tylenol  effective for pain management when used together.  He reports that Dr. Claudene is managing his care and that he underwent an EMG test, after which carpal tunnel syndrome was suspected. He reports swelling in his elbow after a fall, and was told by a doctor that he had a deep bone bruise and a tiny hairline fracture. He is currently taking steroids.  He reports a hiatal hernia causing occasional painful swallowing at the start of meals, which improves with drinking fluids. He has lost seven pounds in the last month, attributing it to water weight loss from sweating during hot days. He takes Nexium  (esomeprazole ) for heartburn and indigestion. No current reflux symptoms or trouble swallowing beyond the initial discomfort at meals.  He uses a Breztri  inhaler regularly and reports it is going well. 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 He is not currently taking aspirin  and does not use sunscreen, leading to frequent skin cuts and  scrapes due to thin skin.  He is on diltiazem  and carvedilol  for blood pressure management, which he feels have been beneficial. No issues with urination or significant prostate problems.   John [name] presents for f/up -     Outpatient Medications Prior to Visit  Medication Sig Dispense Refill   albuterol  (VENTOLIN  HFA) 108 (90 Base) MCG/ACT inhaler Inhale 2 puffs into the lungs every 4 (four) hours as needed for wheezing or shortness of breath. 8 g 5   budeson-glycopyrrolate-formoterol  (BREZTRI  AEROSPHERE) 160-9-4.8 MCG/ACT AERO inhaler Inhale 2 puffs into the lungs in the morning and at bedtime. 1 each 5   carvedilol  (COREG ) 6.25 MG tablet TAKE 1 TABLET BY MOUTH 2 TIMES DAILY WITH A MEAL. 180 tablet 1   CHELATED MAGNESIUM  PO Take 200 mg by mouth in the morning.     cholecalciferol (VITAMIN D3) 25 MCG (1000 UT) tablet Take 1,000 Units by mouth every evening.     Coenzyme Q10 (COQ10) 200 MG CAPS Take 200 mg by mouth daily.     diltiazem  (CARDIZEM  CD) 120 MG 24 hr capsule TAKE 1 CAPSULE(120 MG) BY MOUTH DAILY 90 capsule 0   diltiazem  (CARDIZEM ) 30 MG tablet TAKE 1 TABLET BY MOUTH EVERY 4 HOURS AS NEEDED HR ABOVE 100 AND TOP NUMBER B/P GREATER THAN 100 30 tablet 1   esomeprazole  (NEXIUM ) 20 MG capsule TAKE 1 CAPSULE (20 MG TOTAL) BY MOUTH DAILY AT 12 NOON. 90 capsule 0   Ferrous Sulfate (IRON) 28 MG TABS Take 28 mg by mouth once a week.  fluticasone  (FLONASE ) 50 MCG/ACT nasal spray Place 2 sprays into both nostrils in the morning and at bedtime. (Patient taking differently: Place 1 spray into both nostrils in the morning and at bedtime.) 48 g 0   ipratropium (ATROVENT ) 0.06 % nasal spray USE 1 SPRAY IN EACH NOSTRIL TWICE A DAY 45 mL 5   montelukast  (SINGULAIR ) 10 MG tablet TAKE 1 TABLET BY MOUTH EVERY DAY 90 tablet 0   Multiple Vitamins-Minerals (MULTIVITAMIN WITH MINERALS) tablet Take 1 tablet by mouth in the morning.     naproxen  (NAPROSYN ) 500 MG tablet TAKE 1 TABLET BY MOUTH TWICE A  DAY AS NEEDED 60 tablet 1   olmesartan  (BENICAR ) 40 MG tablet TAKE 1 TABLET BY MOUTH EVERY DAY 90 tablet 0   Omega-3 Fatty Acids (FISH OIL) 1000 MG CAPS Take 1,000 mg by mouth 2 (two) times daily.     OVER THE COUNTER MEDICATION Take 1 tablet by mouth 2 (two) times daily. Prosta-Metto supplement     OVER THE COUNTER MEDICATION Take 1 tablet by mouth every evening. Herba vision gold supplement     POTASSIUM PO Take 50 mg by mouth daily.     Prasterone, DHEA, (DHEA 50 PO) Take 100 mg by mouth daily.     rosuvastatin  (CRESTOR ) 20 MG tablet TAKE 1 TABLET BY MOUTH EVERY DAY 90 tablet 0   sildenafil  (VIAGRA ) 25 MG tablet Take 1 tablet (25 mg total) by mouth daily as needed for erectile dysfunction. 10 tablet 5   simethicone (MYLICON) 125 MG chewable tablet Chew 125 mg by mouth every 6 (six) hours as needed for flatulence.     traMADol  (ULTRAM ) 50 MG tablet Take 1 tablet (50 mg total) by mouth 2 (two) times daily. 120 tablet 1   TURMERIC CURCUMIN PO Take 800 mg by mouth 2 (two) times daily.     vitamin B-12 (CYANOCOBALAMIN ) 250 MCG tablet Take 250 mcg by mouth daily.     vitamin E 180 MG (400 UNITS) capsule Take 400 Units by mouth daily.     DULoxetine  (CYMBALTA ) 20 MG capsule Take 1 capsule (20 mg total) by mouth daily. 90 capsule 0   Flaxseed, Linseed, (FLAX SEED OIL) 1000 MG CAPS Take 1,000 mg by mouth daily.     indapamide  (LOZOL ) 1.25 MG tablet Take 1 tablet (1.25 mg total) by mouth daily. 90 tablet 0   Menaquinone-7 (K2 PO) Take 200 mg by mouth daily.     methylPREDNISolone  (MEDROL  DOSEPAK) 4 MG TBPK tablet 4 pills day 1, 3 pills day 2-3, 2 pills day 4-7, 1 pill day 8-10 21 tablet 0   Suzetrigine  (JOURNAVX ) 50 MG TABS Take 50 mg by mouth in the morning and at bedtime. 60 tablet 0   Vitamin D , Ergocalciferol , (DRISDOL ) 1.25 MG (50000 UNIT) CAPS capsule Take 1 capsule (50,000 Units total) by mouth every 7 (seven) days. 12 capsule 0   No facility-administered medications prior to visit.     ROS Review of Systems  Constitutional:  Negative for appetite change, chills, diaphoresis and fatigue.  HENT: Negative.    Respiratory:  Positive for shortness of breath. Negative for cough, chest tightness and wheezing.   Cardiovascular:  Negative for chest pain, palpitations and leg swelling.  Gastrointestinal:  Negative for abdominal pain, constipation, diarrhea, nausea and vomiting.  Genitourinary:  Negative for difficulty urinating.  Musculoskeletal:  Positive for arthralgias. Negative for myalgias.  Skin: Negative.   Neurological:  Negative for dizziness, weakness and light-headedness.  Hematological:  Negative for  adenopathy. Does not bruise/bleed easily.  Psychiatric/Behavioral: Negative.      Objective:  BP 136/86 (BP Location: Left Arm, Patient Position: Sitting, Cuff Size: Normal)   Pulse 88   Temp 98.7 F (37.1 C) (Oral)   Resp 16   Ht 5' 11 (1.803 m)   Wt 213 lb 9.6 oz (96.9 kg)   SpO2 96%   BMI 29.79 kg/m   BP Readings from Last 3 Encounters:  11/29/23 134/86  11/26/23 136/86  11/05/23 (!) 140/82    Wt Readings from Last 3 Encounters:  11/29/23 212 lb 8 oz (96.4 kg)  11/26/23 213 lb 9.6 oz (96.9 kg)  11/05/23 220 lb (99.8 kg)    Physical Exam Vitals reviewed.  Constitutional:      Appearance: Normal appearance.  HENT:     Nose: Nose normal.     Mouth/Throat:     Mouth: Mucous membranes are moist.  Eyes:     General: No scleral icterus.    Conjunctiva/sclera: Conjunctivae normal.  Cardiovascular:     Rate and Rhythm: Normal rate and regular rhythm.     Heart sounds: Normal heart sounds, S1 normal and S2 normal. No murmur heard.    No friction rub. No gallop.     Comments: EKG--- NSR with SA, 75 bpm No LVH, Q waves, or ST/T wave changes  Unchanged  Pulmonary:     Effort: Pulmonary effort is normal.     Breath sounds: No stridor. No wheezing, rhonchi or rales.  Abdominal:     General: Abdomen is protuberant. There is no distension.      Palpations: There is no hepatomegaly, splenomegaly or mass.     Tenderness: There is no abdominal tenderness. There is no guarding.     Hernia: No hernia is present.  Musculoskeletal:     Cervical back: Neck supple.     Right lower leg: No edema.     Left lower leg: No edema.  Lymphadenopathy:     Cervical: No cervical adenopathy.  Skin:    General: Skin is warm and dry.  Neurological:     General: No focal deficit present.     Mental Status: He is alert. Mental status is at baseline.  Psychiatric:        Mood and Affect: Mood normal.        Behavior: Behavior normal.     Lab Results  Component Value Date   WBC 4.7 11/26/2023   HGB 14.0 11/26/2023   HCT 40.7 11/26/2023   PLT 156.0 11/26/2023   GLUCOSE 96 11/26/2023   CHOL 127 05/28/2023   TRIG 74.0 05/28/2023   HDL 47.30 05/28/2023   LDLCALC 64 05/28/2023   ALT 34 05/16/2023   AST 28 05/16/2023   NA 140 11/26/2023   K 4.4 11/26/2023   CL 103 11/26/2023   CREATININE 1.05 11/26/2023   BUN 21 11/26/2023   CO2 30 11/26/2023   TSH 0.98 05/16/2023   PSA 0.66 11/26/2023   HGBA1C 6.1 11/26/2023    CT ANGIO CHEST AORTA W/CM & OR WO/CM Result Date: 10/30/2023 CLINICAL DATA:  Descending thoracic aortic aneurysm. EXAM: CT ANGIOGRAPHY CHEST WITH CONTRAST TECHNIQUE: Multidetector CT imaging of the chest was performed using the standard protocol during bolus administration of intravenous contrast. Multiplanar CT image reconstructions and MIPs were obtained to evaluate the vascular anatomy. RADIATION DOSE REDUCTION: This exam was performed according to the departmental dose-optimization program which includes automated exposure control, adjustment of the mA and/or kV according to  patient size and/or use of iterative reconstruction technique. CONTRAST:  OMNIPAQUE  IOHEXOL  350 MG/ML SOLN COMPARISON:  Chest CT dated 10/20/2022. FINDINGS: Cardiovascular: There is no cardiomegaly or pericardial effusion. Mild atherosclerotic  calcification of the thoracic aorta. The proximal descending thoracic aorta measures up to 3.6 cm in maximal diameter. No aortic dissection. The origins of the great vessels of the aortic arch appear patent. No pulmonary artery embolus identified. Mediastinum/Nodes: No hilar or mediastinal adenopathy. Small hiatal hernia. The esophagus is grossly unremarkable. No mediastinal fluid collection. Lungs/Pleura: No focal consolidation, pleural effusion, or pneumothorax. The central airways are patent. Upper Abdomen: No acute abnormality. Musculoskeletal: No acute osseous pathology. Review of the MIP images confirms the above findings. IMPRESSION: 1. No acute intrathoracic pathology. 2. Proximal descending thoracic aorta measures up to 3.6 cm in diameter. No aortic dissection. 3.  Aortic Atherosclerosis (ICD10-I70.0). Electronically Signed   By: John [name] M.D.   On: 10/30/2023 18:17   DG INJECT DIAG/THERA/INC NEEDLE/CATH/PLC EPI/CERV/THOR W/IMG Result Date: 10/29/2023 CLINICAL DATA:  Cervical spondylosis without myelopathy. Status post C5-6 ACDF in December. Persistent neck pain and right upper extremity pain and numbness. FLUOROSCOPY: Radiation Exposure Index (as provided by the fluoroscopic device): 4.30 mGy Kerma PROCEDURE: The procedure, risks, benefits, and alternatives were explained to the patient. Questions regarding the procedure were encouraged and answered. The patient understands and consents to the procedure. CERVICAL EPIDURAL INJECTION An interlaminar approach was performed on the right at C7-T1. A 3.5 inch 20 gauge epidural needle was advanced using loss-of-resistance technique. DIAGNOSTIC EPIDURAL INJECTION Injection of Isovue -M 300 shows a good epidural pattern with spread above and below the level of needle placement primarily on the right. No vascular opacification is seen. THERAPEUTIC EPIDURAL INJECTION 60 mg of Kenalog  mixed with 2 mL of normal saline were then instilled. The procedure was  well-tolerated, and the patient was discharged 20 minutes following the injection in good condition. IMPRESSION: Technically successful interlaminar epidural injection on the right at C7-T1. Electronically Signed   By: John [name] M.D.   On: 10/29/2023 14:02   NCV with EMG(electromyography) Result Date: 10/29/2023 John John CROME, John [name]     10/29/2023 10:50 AM [facility] [address] [place], [address]  [place], [place] [address] Tel: [phone] Fax: [phone] Test Date:  10/29/2023 Patient: John [name] DOB: [birth year] Physician: John Leigh, John [name] Sex: Male Height: 5' 11 Ref Phys: Arthea Sharps, DO ID#: [id number]   Technician:  History: This is a 68 year old male with bilateral arm pain. NCV & EMG Findings: Extensive electrodiagnostic evaluation of bilateral upper limbs shows: Right median sensory response is absent. Left median sensory response shows prolonged distal peak latency (4.0 ms) and reduced amplitude (8 V). Left median-ulnar palmar sensory responses show prolonged distal peak latency (Median Palm-Wrist, 2.6 ms) and abnormal peak latency difference ((Median Palm-Wrist)-(Ulnar Palm-Wrist), 0.70 ms).  Bilateral ulnar and radial sensory responses are within normal limits. Bilateral median (APB) and ulnar (ADM) motor responses are within normal limits. Chronic motor axon loss changes without accompanying active denervation changes are seen in bilateral abductor pollicis brevis muscles. Impression: This is an abnormal study. The findings are most consistent with the following: Evidence of bilateral median mononeuropathy at or distal to the wrist, consistent with carpal tunnel syndrome. The findings are severe in degree electrically on the right and mild in degree electrically on the left. No electrodiagnostic evidence of a right or left cervical (C5-T1) motor radiculopathy. Screening studies for right or left ulnar or radial mononeuropathies  are normal. ___________________________ John Potters, John [name] Nerve  Conduction Studies Motor Nerve Results   Latency Amplitude F-Lat Segment Distance CV Comment Site (ms) Norm (mV) Norm (ms)  (cm) (m/s) Norm  Left Median (APB) Motor Wrist 3.4  < 4.0 5.4  > 5.0       Elbow 9.3 - 4.4 -  Elbow-Wrist 30.5 52  > 50  Right Median (APB) Motor Wrist 2.9  < 4.0 5.6  > 5.0       Elbow 9.0 - 5.1 -  Elbow-Wrist 31 51  > 50  Left Ulnar (ADM) Motor Wrist 2.1  < 3.1 8.5  > 7.0       Bel elbow 6.3 - 7.7 -  Bel elbow-Wrist 23.5 56  > 50  Ab elbow 8.0 - 7.3 -  Ab elbow-Bel elbow 10 59 -  Right Ulnar (ADM) Motor Wrist 1.90  < 3.1 8.3  > 7.0       Bel elbow 6.5 - 7.2 -  Bel elbow-Wrist 24 52  > 50  Ab elbow 8.3 - 7.0 -  Ab elbow-Bel elbow 10 56 -  Sensory Sites   Neg Peak Lat Amplitude (O-P) Segment Distance Velocity Comment Site (ms) Norm (V) Norm  (cm) (ms)  Left Median Sensory Wrist-Dig II *4.0  < 3.8 *8  > 10 Wrist-Dig II 13   Right Median Sensory Wrist-Dig II *NR  < 3.8 *NR  > 10 Wrist-Dig II 13   Left Median-Ulnar Palmar Sensory      Median Palm-Wrist *2.6  < 2.2 19  > 10 Palm-Wrist 8        Ulnar Palm-Wrist 1.90  < 2.2 14  > 5 Palm-Wrist 8   Left Radial Sensory Forearm-Wrist 2.2  < 2.8 12  > 10 Forearm-Wrist 10   Right Radial Sensory Forearm-Wrist 2.2  < 2.8 11  > 10 Forearm-Wrist 10   Left Ulnar Sensory Wrist-Dig V 2.9  < 3.2 7  > 5 Wrist-Dig V 11   Right Ulnar Sensory Wrist-Dig V 2.7  < 3.2 13  > 5 Wrist-Dig V 11   Inter-Nerve Comparisons  Nerve 1 Value 1 Nerve 2 Value 2 Parameter Result Normal Sensory Sites L Median Palm-Wrist 2.6 ms L Ulnar Palm-Wrist 1.90 ms Peak Lat Diff *0.70 ms <0.40 Electromyography  Side Muscle Ins.Act Fibs Fasc Recrt Amp Dur Poly Activation Comment Right FDI Nml Nml Nml Nml Nml Nml Nml Nml N/A Right EIP Nml Nml Nml Nml Nml Nml Nml Nml N/A Right Pronator teres Nml Nml Nml Nml Nml Nml Nml Nml N/A Right APB *1+ Nml Nml *3- Nml Nml Nml Nml N/A Right Biceps Nml Nml Nml Nml Nml Nml Nml Nml N/A Right Triceps Nml Nml Nml Nml Nml Nml Nml Nml N/A Right Deltoid Nml Nml Nml Nml  Nml Nml Nml Nml N/A Left FDI Nml Nml Nml Nml Nml Nml Nml Nml N/A Left EIP Nml Nml Nml Nml Nml Nml Nml Nml N/A Left Pronator teres Nml Nml Nml Nml Nml Nml Nml Nml N/A Left APB Nml Nml Nml *2- *1+ *1+ *1+ Nml N/A Left Biceps Nml Nml Nml Nml Nml Nml Nml Nml N/A Left Triceps Nml Nml Nml Nml Nml Nml Nml Nml N/A Left Deltoid Nml Nml Nml Nml Nml Nml Nml Nml N/A Waveforms: Motor       Sensory               Assessment & Plan:  Benign prostatic hyperplasia without lower urinary tract symptoms -  PSA; Future  Primary hypertension- BP is adequately well controlled. -     Basic metabolic panel with GFR; Future -     EKG 12-Lead  Chronic hyperglycemia -     Basic metabolic panel with GFR; Future -     Hemoglobin A1c; Future  Gastroesophageal reflux disease without esophagitis -     CBC with Differential/Platelet; Future  PAF (paroxysmal atrial fibrillation) (HCC)- He has good R/R control.  Chronic diastolic heart failure (HCC) -     Brain natriuretic peptide; Future  DOE (dyspnea on exertion) -     Troponin I (High Sensitivity); Future -     Brain natriuretic peptide; Future     Follow-up: Return in about 6 months (around 05/28/2024).  Debby Molt, John [name]

## 2023-11-26 NOTE — Patient Instructions (Signed)
 Hypertension, Adult High blood pressure (hypertension) is when the force of blood pumping through the arteries is too strong. The arteries are the blood vessels that carry blood from the heart throughout the body. Hypertension forces the heart to work harder to pump blood and may cause arteries to become narrow or stiff. Untreated or uncontrolled hypertension can lead to a heart attack, heart failure, a stroke, kidney disease, and other problems. A blood pressure reading consists of a higher number over a lower number. Ideally, your blood pressure should be below 120/80. The first ("top") number is called the systolic pressure. It is a measure of the pressure in your arteries as your heart beats. The second ("bottom") number is called the diastolic pressure. It is a measure of the pressure in your arteries as the heart relaxes. What are the causes? The exact cause of this condition is not known. There are some conditions that result in high blood pressure. What increases the risk? Certain factors may make you more likely to develop high blood pressure. Some of these risk factors are under your control, including: Smoking. Not getting enough exercise or physical activity. Being overweight. Having too much fat, sugar, calories, or salt (sodium) in your diet. Drinking too much alcohol. Other risk factors include: Having a personal history of heart disease, diabetes, high cholesterol, or kidney disease. Stress. Having a family history of high blood pressure and high cholesterol. Having obstructive sleep apnea. Age. The risk increases with age. What are the signs or symptoms? High blood pressure may not cause symptoms. Very high blood pressure (hypertensive crisis) may cause: Headache. Fast or irregular heartbeats (palpitations). Shortness of breath. Nosebleed. Nausea and vomiting. Vision changes. Severe chest pain, dizziness, and seizures. How is this diagnosed? This condition is diagnosed by  measuring your blood pressure while you are seated, with your arm resting on a flat surface, your legs uncrossed, and your feet flat on the floor. The cuff of the blood pressure monitor will be placed directly against the skin of your upper arm at the level of your heart. Blood pressure should be measured at least twice using the same arm. Certain conditions can cause a difference in blood pressure between your right and left arms. If you have a high blood pressure reading during one visit or you have normal blood pressure with other risk factors, you may be asked to: Return on a different day to have your blood pressure checked again. Monitor your blood pressure at home for 1 week or longer. If you are diagnosed with hypertension, you may have other blood or imaging tests to help your health care provider understand your overall risk for other conditions. How is this treated? This condition is treated by making healthy lifestyle changes, such as eating healthy foods, exercising more, and reducing your alcohol intake. You may be referred for counseling on a healthy diet and physical activity. Your health care provider may prescribe medicine if lifestyle changes are not enough to get your blood pressure under control and if: Your systolic blood pressure is above 130. Your diastolic blood pressure is above 80. Your personal target blood pressure may vary depending on your medical conditions, your age, and other factors. Follow these instructions at home: Eating and drinking  Eat a diet that is high in fiber and potassium, and low in sodium, added sugar, and fat. An example of this eating plan is called the DASH diet. DASH stands for Dietary Approaches to Stop Hypertension. To eat this way: Eat  plenty of fresh fruits and vegetables. Try to fill one half of your plate at each meal with fruits and vegetables. Eat whole grains, such as whole-wheat pasta, brown rice, or whole-grain bread. Fill about one  fourth of your plate with whole grains. Eat or drink low-fat dairy products, such as skim milk or low-fat yogurt. Avoid fatty cuts of meat, processed or cured meats, and poultry with skin. Fill about one fourth of your plate with lean proteins, such as fish, chicken without skin, beans, eggs, or tofu. Avoid pre-made and processed foods. These tend to be higher in sodium, added sugar, and fat. Reduce your daily sodium intake. Many people with hypertension should eat less than 1,500 mg of sodium a day. Do not drink alcohol if: Your health care provider tells you not to drink. You are pregnant, may be pregnant, or are planning to become pregnant. If you drink alcohol: Limit how much you have to: 0-1 drink a day for women. 0-2 drinks a day for men. Know how much alcohol is in your drink. In the [place] one drink equals one 12 oz bottle of beer (355 mL), one 5 oz glass of wine (148 mL), or one 1 oz glass of hard liquor (44 mL). Lifestyle  Work with your health care provider to maintain a healthy body weight or to lose weight. Ask what an ideal weight is for you. Get at least 30 minutes of exercise that causes your heart to beat faster (aerobic exercise) most days of the week. Activities may include walking, swimming, or biking. Include exercise to strengthen your muscles (resistance exercise), such as Pilates or lifting weights, as part of your weekly exercise routine. Try to do these types of exercises for 30 minutes at least 3 days a week. Do not use any products that contain nicotine or tobacco. These products include cigarettes, chewing tobacco, and vaping devices, such as e-cigarettes. If you need help quitting, ask your health care provider. Monitor your blood pressure at home as told by your health care provider. Keep all follow-up visits. This is important. Medicines Take over-the-counter and prescription medicines only as told by your health care provider. Follow directions carefully. Blood  pressure medicines must be taken as prescribed. Do not skip doses of blood pressure medicine. Doing this puts you at risk for problems and can make the medicine less effective. Ask your health care provider about side effects or reactions to medicines that you should watch for. Contact a health care provider if you: Think you are having a reaction to a medicine you are taking. Have headaches that keep coming back (recurring). Feel dizzy. Have swelling in your ankles. Have trouble with your vision. Get help right away if you: Develop a severe headache or confusion. Have unusual weakness or numbness. Feel faint. Have severe pain in your chest or abdomen. Vomit repeatedly. Have trouble breathing. These symptoms may be an emergency. Get help right away. Call 911. Do not wait to see if the symptoms will go away. Do not drive yourself to the hospital. Summary Hypertension is when the force of blood pumping through your arteries is too strong. If this condition is not controlled, it may put you at risk for serious complications. Your personal target blood pressure may vary depending on your medical conditions, your age, and other factors. For most people, a normal blood pressure is less than 120/80. Hypertension is treated with lifestyle changes, medicines, or a combination of both. Lifestyle changes include losing weight, eating a healthy,  low-sodium diet, exercising more, and limiting alcohol. This information is not intended to replace advice given to you by your health care provider. Make sure you discuss any questions you have with your health care provider. Document Revised: 02/01/2021 Document Reviewed: 02/01/2021 Elsevier Patient Education  2024 [employer].

## 2023-11-29 ENCOUNTER — Encounter (HOSPITAL_BASED_OUTPATIENT_CLINIC_OR_DEPARTMENT_OTHER): Payer: Self-pay | Admitting: Pulmonary Disease

## 2023-11-29 ENCOUNTER — Ambulatory Visit (HOSPITAL_BASED_OUTPATIENT_CLINIC_OR_DEPARTMENT_OTHER): Admitting: Pulmonary Disease

## 2023-11-29 VITALS — BP 134/86 | HR 85 | Ht 71.0 in | Wt 212.5 lb

## 2023-11-29 DIAGNOSIS — Z87891 Personal history of nicotine dependence: Secondary | ICD-10-CM | POA: Diagnosis not present

## 2023-11-29 DIAGNOSIS — J449 Chronic obstructive pulmonary disease, unspecified: Secondary | ICD-10-CM

## 2023-11-29 DIAGNOSIS — I7123 Aneurysm of the descending thoracic aorta, without rupture: Secondary | ICD-10-CM | POA: Diagnosis not present

## 2023-11-29 DIAGNOSIS — J4489 Other specified chronic obstructive pulmonary disease: Secondary | ICD-10-CM

## 2023-11-29 NOTE — Progress Notes (Signed)
   Subjective:    Patient ID: John [name], male    DOB: December 23, 1955, 68 y.o.   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  Former smoker -57 pack years, quit 2015   Meds : used Breo 2024, and used some Trelegy samples- very expensive, turned down for [employer] assistance  PMH - ACDF   Discussed the use of AI scribe software for clinical note transcription with the patient, who gave verbal consent to proceed.  History of Present Illness   Discussed the use of AI scribe software for clinical note transcription with the patient, who gave verbal consent to proceed.  History of Present Illness   John [name] is a 68 year old male with COPD who presents for a follow-up visit.  His breathing is stable. He experienced dyspnea after taking Lyrica post-surgery in December, which resolved three days after stopping the medication. He uses Breztri , taking two puffs at night and one in the morning, with no issues obtaining it. He prefers Ellipta inhalers but is satisfied with Breztri .            Significant tests/ events reviewed   10/2023 CTA chest >> 3.6 cm desc TAA,   10/2022 CT angio aorta >> aneurysmal dilation of the proximal descending thoracic aorta measuring 3.8 x 3.4 cm. Aortic arch measures 2.9 cm. Ascending thoracic aorta measures 3.7 cm   PFTs 10/2019 mild airway obstruction, ratio 65, FEV1 61%, FVC 71%, improved to 69% with 12% bronchodilator response  Review of Systems  neg for any significant sore throat, dysphagia, itching, sneezing, nasal congestion or excess/ purulent secretions, fever, chills, sweats, unintended wt loss, pleuritic or exertional cp, hempoptysis, orthopnea pnd or change in chronic leg swelling. Also denies presyncope, palpitations, heartburn, abdominal pain, nausea, vomiting, diarrhea or change in bowel or urinary habits, dysuria,hematuria, rash, arthralgias, visual complaints, headache, numbness weakness or ataxia.       Objective:   Physical Exam  Gen. Pleasant, well-nourished, in no distress ENT - no thrush, no pallor/icterus,no post nasal drip Neck: No JVD, no thyromegaly, no carotid bruits Lungs: no use of accessory muscles, no dullness to percussion, clear without rales or rhonchi  Cardiovascular: Rhythm regular, heart sounds  normal, no murmurs or gallops, no peripheral edema Musculoskeletal: No deformities, no cyanosis or clubbing        Assessment & Plan:   Assessment and Plan Assessment & Plan  Assessment and Plan    Chronic obstructive pulmonary disease (COPD) COPD is well-managed with Breztri  inhaler. Breathing improved after discontinuation of pregabalin, which was causing dyspnea. Lung function at 67%, indicating stage 2 COPD. No recent exacerbations or respiratory infections. Continues to vape, which may impact lung health. Prefers Ellipta delivery device but avoids [employer] products due to personal reasons. - Continue Breztri  inhaler. - Monitor lung function and symptoms annually. - Discuss potential pneumonia vaccination with primary care provider. - Consider RSV vaccination at pharmacy if desired.  Descending thoracic aortic aneurysm, stable Descending thoracic aortic aneurysm remains stable as per recent CT scan. No significant changes. The CT scan also screens for lung cancer. - Follow up with cardiologist next month for aneurysm monitoring. - Coordinate annual CT scan with cardiologist to monitor aneurysm and screen for lung cancer.

## 2023-11-29 NOTE — Patient Instructions (Signed)
  VISIT SUMMARY: You came in today for a follow-up visit regarding your COPD. Your breathing is stable, and you have not had any recent exacerbations or respiratory infections. We discussed your current medications and any recent changes in your health.  YOUR PLAN: -CHRONIC OBSTRUCTIVE PULMONARY DISEASE (COPD): COPD is a chronic lung disease that makes it hard to breathe. Your COPD is well-managed with the Breztri  inhaler, and your lung function is at 61%, indicating stage 2 COPD. You have not had any recent exacerbations or respiratory infections. Continue using the Breztri  inhaler as prescribed. We will monitor your lung function and symptoms annually. Discuss the potential pneumonia vaccination with your primary care provider and consider getting an RSV vaccination at the pharmacy if you wish.  -DESCENDING THORACIC AORTIC ANEURYSM, STABLE: A descending thoracic aortic aneurysm is a bulge in the aorta, the main artery in your chest. Your aneurysm is stable according to your recent CT scan, which also screens for lung cancer. Follow up with your cardiologist next month for monitoring. We will coordinate an annual CT scan with your cardiologist to keep an eye on the aneurysm and screen for lung cancer.  INSTRUCTIONS: Follow up with your cardiologist next month for aneurysm monitoring. Discuss potential pneumonia vaccination with your primary care provider. Consider RSV vaccination at the pharmacy if desired.                      Contains text generated by [employer].                                 Contains text generated by [employer].

## 2023-12-04 ENCOUNTER — Ambulatory Visit (HOSPITAL_BASED_OUTPATIENT_CLINIC_OR_DEPARTMENT_OTHER): Admitting: Cardiology

## 2023-12-07 ENCOUNTER — Other Ambulatory Visit: Payer: Self-pay | Admitting: Internal Medicine

## 2023-12-07 DIAGNOSIS — M461 Sacroiliitis, not elsewhere classified: Secondary | ICD-10-CM

## 2023-12-07 DIAGNOSIS — M4800 Spinal stenosis, site unspecified: Secondary | ICD-10-CM

## 2023-12-07 DIAGNOSIS — G8929 Other chronic pain: Secondary | ICD-10-CM

## 2023-12-07 DIAGNOSIS — M19011 Primary osteoarthritis, right shoulder: Secondary | ICD-10-CM

## 2023-12-07 DIAGNOSIS — M5136 Other intervertebral disc degeneration, lumbar region with discogenic back pain only: Secondary | ICD-10-CM

## 2023-12-07 DIAGNOSIS — M150 Primary generalized (osteo)arthritis: Secondary | ICD-10-CM

## 2023-12-11 ENCOUNTER — Encounter: Payer: Self-pay | Admitting: Internal Medicine

## 2023-12-12 NOTE — Telephone Encounter (Signed)
 Refill requested for Tramadol  sent to [facility] at [address].  Last appt 11/26/23 Next appt 06/02/24

## 2023-12-12 NOTE — Telephone Encounter (Unsigned)
 Copied from CRM [id number]. Topic: Clinical - Prescription Issue >> Dec 12, 2023 10:34 AM Drema MATSU wrote: Reason for CRM: Patient stated that [facility] sent [facility] refill request on 08/29 for Tramadol . Patient received a message stating that pcp will be out until 09/09. Patient has 1 pill left and needs medication. Please call to give patient an update.

## 2023-12-24 ENCOUNTER — Encounter (HOSPITAL_BASED_OUTPATIENT_CLINIC_OR_DEPARTMENT_OTHER): Payer: Self-pay | Admitting: Cardiology

## 2023-12-24 ENCOUNTER — Ambulatory Visit (HOSPITAL_BASED_OUTPATIENT_CLINIC_OR_DEPARTMENT_OTHER): Admitting: Cardiology

## 2023-12-24 VITALS — BP 132/72 | HR 86 | Resp 16 | Ht 71.0 in | Wt 207.0 lb

## 2023-12-24 DIAGNOSIS — Z7189 Other specified counseling: Secondary | ICD-10-CM

## 2023-12-24 DIAGNOSIS — E785 Hyperlipidemia, unspecified: Secondary | ICD-10-CM

## 2023-12-24 DIAGNOSIS — I10 Essential (primary) hypertension: Secondary | ICD-10-CM | POA: Diagnosis not present

## 2023-12-24 DIAGNOSIS — I480 Paroxysmal atrial fibrillation: Secondary | ICD-10-CM | POA: Diagnosis not present

## 2023-12-24 DIAGNOSIS — I2510 Atherosclerotic heart disease of native coronary artery without angina pectoris: Secondary | ICD-10-CM

## 2023-12-24 NOTE — Progress Notes (Signed)
 Cardiology Office Note:  .   Date:  12/24/2023  ID:  John [name], DOB Aug 29, 1955, MRN [id number] PCP: [name], MD  [facility] Providers Cardiologist:  Shelda Bruckner, MD {  History of Present Illness: .   John [name] is a 68 y.o. male with PMH paroxysmal atrial fibrillation, hypertension, hyperlipidemia, COPD, OSA on CPAP who is here for follow up. I met him 04/25/22 for preoperative cardiovascular evaluation and history of chest pain.   Pertinent CV history: Seen by his PCP on 01/30/22 for several month history of chest pain described as twinges at rest. Hx of paroxysmal afib, not on anticoagulation; good rate/rhythm control. An ETT was ordered but not performed due to orthopedic limitations. CT calcium  score was performed instead showing a coronary calcium  score of 41.7. He was cleared for surgery at that time as moderate risk.    Originally scheduled for anterior cervical discectomy and fusion (ACDF C5-6) 03/28/22 which was aborted due to hypotension on induction (unresponsive to pressors and the case was aborted). Dr. Debby moved the case to [facility] and ordered preop echocardiogram on the morning of surgery 03/31/2022. Per anesthesiology it was not felt to be safe to proceed with general anesthesia without a stress test given his history of chest pain, comorbidities, and prior reaction to induction. Patient obtained anesthesia records, under media tab from 04/27/22.  Had CT coronary 04/2022 showing nonobstructive mild CAD in LAD, Ca score 45.5, borderline enlargement of ascending aorta (measured 37 mm). CT angio aorta 10/2023 measured 36 mm maximum diameter.  Today: He saw Dr. Joshua for follow up last month, reported generally feeling fatigued. Had ECG, hsTn, and BNP checked which were all normal.  Has had slow recovery from his spinal surgery. Most improvement has come from neck injection about two months ago. Has been able to be more physically active. Was lifting 50 lbs  bags off a trailer (lifted a total of 80 of them), had some focal twinges on his right chest after, tender to palpation, resolved spontaneously.  No alcohol, does not smoke but he vapes. Drinks a lot of soda, diet wasn't great. Was told he was prediabetic. Has cut back on soda, avoids anything with high fructose corn syrup, and has lost 15 lbs in about a month  ROS: Denies shortness of breath at rest or with normal exertion. No PND, orthopnea, LE edema or unexpected weight gain. No syncope or palpitations. ROS otherwise negative except as noted.   Studies Reviewed: SABRA    EKG:       Physical Exam:   VS:  BP 132/72 (BP Location: Left Arm, Patient Position: Sitting, Cuff Size: Normal)   Pulse 86   Resp 16   Ht 5' 11 (1.803 m)   Wt 207 lb (93.9 kg)   SpO2 97%   BMI 28.87 kg/m    Wt Readings from Last 3 Encounters:  12/24/23 207 lb (93.9 kg)  11/29/23 212 lb 8 oz (96.4 kg)  11/26/23 213 lb 9.6 oz (96.9 kg)    GEN: Well nourished, well developed in no acute distress HEENT: Normal, moist mucous membranes NECK: No JVD CARDIAC: regular rhythm, normal S1 and S2, no rubs or gallops. No murmur. VASCULAR: Radial and DP pulses 2+ bilaterally. No carotid bruits RESPIRATORY:  Clear to auscultation without rales, wheezing or rhonchi  ABDOMEN: Soft, non-tender, non-distended MUSCULOSKELETAL:  Ambulates independently SKIN: Warm and dry, no edema NEUROLOGIC:  Alert and oriented x 3. No focal neuro deficits noted. PSYCHIATRIC:  Normal affect    ASSESSMENT AND PLAN: .    Nonobstructive CAD Aortic atherosclerosis -coronary CT with mild nonobstructive CAD -echo unremarkable, ECG unremarkable -tried aspirin  81 mg daily, but cannot take as it tears up his GI system (irritation, no bleeding) -continue rosuvastatin  20 mg daily. LDL goal <70, last 64 05/2023 -reviewed red flag warning signs that need immediate medical attention  Concern for aorta dilation -given his BSA, the 37 mm reading on  coronary CT in 04/2022 (ascending aorta) is normal. 36 mm reading 10/2023 (proximal descending) is just slightly above threshold for age/BSA, but I reviewed on other images (field of view does not extend to proximal descending aorta on CT coronary, but when checked against CTs from 10/2022, measurement is stable on both contrast and noncontrast images). Does not require dedicated imaging, can be followed on lung cancer screening studies as it is stable. We reviewed today.   Paroxysmal atrial fibrillation -denies recent events -chadsvasc 2. Not currently on anticoagulation, declines unless he has recurrent/sustained events   Hypertension -continue olmesartan , diltiazem , carvedilol   CV risk counseling and prevention -recommend heart healthy/Mediterranean diet, with whole grains, fruits, vegetable, fish, lean meats, nuts, and olive oil. Limit salt. -recommend moderate walking, 3-5 times/week for 30-50 minutes each session. Aim for at least 150 minutes/week. Goal should be pace of 3 miles/hours, or walking 1.5 miles in 30 minutes -recommend avoidance of tobacco products. Avoid excess alcohol.  Dispo: 1 year or sooner as needed  Signed, Shelda Bruckner, MD   Shelda Bruckner, MD, PhD, [facility]  [facility]  [place]  [facility] [address] [place], [place] [address] [phone]

## 2023-12-24 NOTE — Patient Instructions (Addendum)

## 2023-12-26 ENCOUNTER — Other Ambulatory Visit: Payer: Self-pay | Admitting: Internal Medicine

## 2023-12-26 DIAGNOSIS — J309 Allergic rhinitis, unspecified: Secondary | ICD-10-CM

## 2023-12-28 NOTE — Progress Notes (Signed)
 [name] [address] [place] [address] Phone: [phone] Subjective:   LILLETTE Berwyn Posey, am serving as a scribe for Dr. Arthea Claudene.  I'm seeing this patient by the request  of:  [name], MD  CC: Neck pain follow-up elbow pain and swelling  YEP:Dlagzrupcz  11/05/2023 Injection given today, tolerated the procedure well.  Discussed with patient that this is diagnostic as well as therapeutic and we will see how much this does help patient with the symptoms.  Depending on this will also help us  delineate how much is from the neck of her so much of it is from the wrist.  Patient knows and will consider the possibility of surgical intervention for the carpal tunnel at some point with the severity noted on the nerve conduction test.  Would like to get through the season where he does a lot of yard work.  Follow-up with me again in 3 months   Significant improvement after the epidural.  Very happy that this is making a difference for him.  Discussed icing regimen of home exercises, increase activity slowly.  Follow-up again 3 months.      Update 12/31/2023 John [name] is a 68 y.o. male coming in with complaint of cervical spine pain. Patient states that last epidural was very helpful. Cna do things that he last did 3 years.   Injected in R wrist was not as helpful.   Also continues to have R elbow bursae swelling.     Past Medical History:  Diagnosis Date   Afib (HCC)    no issues since 2018/2019   Anxiety and depression    Asthma    Complication of anesthesia    blood pressure dropped when trying to have ACDF at surgery center- aborted surgery and re-scheduled   Emphysema of lung (HCC)    GERD (gastroesophageal reflux disease)    Hernia    right inguinal   History of bronchitis    History of kidney stones    pt reports kidney stone on scan   Hyperlipidemia    Hypertension    Morton's neuroma    OA (osteoarthritis)    neck    Obstructive sleep apnea on CPAP    not using CPAP currently   Pancreatitis    Torn meniscus    Left   Past Surgical History:  Procedure Laterality Date   ANTERIOR CERVICAL DECOMP/DISCECTOMY FUSION N/A 03/29/2023   Procedure: Anterior Cervical Decompression/Discectomy Fusion, Cervical Five-Cervical Six;  Surgeon: Debby Dorn MATSU, MD;  Location: [facility] OR;  Service: Neurosurgery;  Laterality: N/A;  3C   BONE CYST EXCISION  1991   left wrist   COLONOSCOPY     HERNIA REPAIR  04/14/2011   right inguinal   HERNIA REPAIR     left inguinal, december 2020   Social History   Socioeconomic History   Marital status: Divorced    Spouse name: Not on file   Number of children: Not on file   Years of education: Not on file   Highest education level: Not on file  Occupational History   Not on file  Tobacco Use   Smoking status: Former    Current packs/day: 0.00    Average packs/day: 1.5 packs/day for 38.0 years (57.0 ttl pk-yrs)    Types: Cigarettes    Start date: 25    Quit date: 2015    Years since quitting: 10.7   Smokeless tobacco: Never  Vaping Use  Vaping status: Every Day   Substances: Nicotine  Substance and Sexual Activity   Alcohol use: Not Currently   Drug use: Yes    Comment: Delta-8 THC gummies 2-3 times/week   Sexual activity: Yes    Partners: Female  Other Topics Concern   Not on file  Social History Narrative   Not on file   Social Drivers of Health   Financial Resource Strain: Not on file  Food Insecurity: Not on file  Transportation Needs: Not on file  Physical Activity: Not on file  Stress: Not on file  Social Connections: Not on file   Allergies  Allergen Reactions   Nsaids Other (See Comments)    Upsets colon when taken in the evening time   Aprindine Other (See Comments)    Unknown reaction   Gabapentin  Nausea And Vomiting and Palpitations    And severe anxiety and terrible nightmares    Family History  Problem Relation Age of Onset    Hypertension Mother    Dementia Other    Hypertension Sister    Hypertension Brother      Current Outpatient Medications (Cardiovascular):    carvedilol  (COREG ) 6.25 MG tablet, TAKE 1 TABLET BY MOUTH 2 TIMES DAILY WITH A MEAL.   diltiazem  (CARDIZEM  CD) 120 MG 24 hr capsule, TAKE 1 CAPSULE(120 MG) BY MOUTH DAILY   diltiazem  (CARDIZEM ) 30 MG tablet, TAKE 1 TABLET BY MOUTH EVERY 4 HOURS AS NEEDED HR ABOVE 100 AND TOP NUMBER B/P GREATER THAN 100   olmesartan  (BENICAR ) 40 MG tablet, TAKE 1 TABLET BY MOUTH EVERY DAY   rosuvastatin  (CRESTOR ) 20 MG tablet, TAKE 1 TABLET BY MOUTH EVERY DAY   sildenafil  (VIAGRA ) 25 MG tablet, Take 1 tablet (25 mg total) by mouth daily as needed for erectile dysfunction.  Current Outpatient Medications (Respiratory):    albuterol  (VENTOLIN  HFA) 108 (90 Base) MCG/ACT inhaler, Inhale 2 puffs into the lungs every 4 (four) hours as needed for wheezing or shortness of breath.   budeson-glycopyrrolate-formoterol  (BREZTRI  AEROSPHERE) 160-9-4.8 MCG/ACT AERO inhaler, Inhale 2 puffs into the lungs in the morning and at bedtime.   fluticasone  (FLONASE ) 50 MCG/ACT nasal spray, Place 2 sprays into both nostrils in the morning and at bedtime. (Patient taking differently: Place 1 spray into both nostrils in the morning and at bedtime.)   ipratropium (ATROVENT ) 0.06 % nasal spray, USE 1 SPRAY IN EACH NOSTRIL TWICE A DAY   montelukast  (SINGULAIR ) 10 MG tablet, TAKE 1 TABLET BY MOUTH EVERY DAY  Current Outpatient Medications (Analgesics):    naproxen  (NAPROSYN ) 500 MG tablet, TAKE 1 TABLET BY MOUTH TWICE A DAY AS NEEDED   traMADol  (ULTRAM ) 50 MG tablet, TAKE 1 TABLET BY MOUTH TWICE A DAY  Current Outpatient Medications (Hematological):    Ferrous Sulfate (IRON) 28 MG TABS, Take 28 mg by mouth once a week.   vitamin B-12 (CYANOCOBALAMIN ) 250 MCG tablet, Take 250 mcg by mouth daily.  Current Outpatient Medications (Other):    CHELATED MAGNESIUM  PO, Take 200 mg by mouth in the  morning.   cholecalciferol (VITAMIN D3) 25 MCG (1000 UT) tablet, Take 1,000 Units by mouth every evening.   Coenzyme Q10 (COQ10) 200 MG CAPS, Take 200 mg by mouth daily.   esomeprazole  (NEXIUM ) 20 MG capsule, TAKE 1 CAPSULE (20 MG TOTAL) BY MOUTH DAILY AT 12 NOON.   Multiple Vitamins-Minerals (MULTIVITAMIN WITH MINERALS) tablet, Take 1 tablet by mouth in the morning.   Omega-3 Fatty Acids (FISH OIL) 1000 MG CAPS, Take 1,000  mg by mouth 2 (two) times daily.   OVER THE COUNTER MEDICATION, Take 1 tablet by mouth 2 (two) times daily. Prosta-Metto supplement   OVER THE COUNTER MEDICATION, Take 1 tablet by mouth every evening. Herba vision gold supplement   POTASSIUM PO, Take 50 mg by mouth daily.   Prasterone, DHEA, (DHEA 50 PO), Take 100 mg by mouth daily.   simethicone (MYLICON) 125 MG chewable tablet, Chew 125 mg by mouth every 6 (six) hours as needed for flatulence.   TURMERIC CURCUMIN PO, Take 800 mg by mouth 2 (two) times daily.   vitamin E 180 MG (400 UNITS) capsule, Take 400 Units by mouth daily.   Reviewed prior external information including notes and imaging from  primary care provider As well as notes that were available from care everywhere and other healthcare systems.  Past medical history, social, surgical and family history all reviewed in electronic medical record.  No pertanent information unless stated regarding to the chief complaint.   Review of Systems:  No headache, visual changes, nausea, vomiting, diarrhea, constipation, dizziness, abdominal pain, skin rash, fevers, chills, night sweats, weight loss, swollen lymph nodes, body aches, joint swelling, chest pain, shortness of breath, mood changes. POSITIVE muscle aches  Objective  Blood pressure 122/82, pulse 64, height 5' 11 (1.803 m), weight 207 lb (93.9 kg), SpO2 96%.   General: No apparent distress alert and oriented x3 mood and affect normal, dressed appropriately.  HEENT: Pupils equal, extraocular movements intact   Respiratory: Patient's speak in full sentences and does not appear short of breath  Cardiovascular: No lower extremity edema, non tender, no erythema  Neck exam mild loss of lordosis but is feeling significantly better. Positive Tinel still noted at the right carpal tunnel area.  Olecranon bursa is still swollen.  No erythema noted.  Procedure: Real-time Ultrasound Guided Injection of right carpal tunnel Device: GE Logiq Q7  Ultrasound guided injection is preferred based studies that show increased duration, increased effect, greater accuracy, decreased procedural pain, increased response rate with ultrasound guided versus blind injection.  Verbal informed consent obtained.  Time-out conducted.  Noted no overlying erythema, induration, or other signs of local infection.  Skin prepped in a sterile fashion.  Local anesthesia: Topical Ethyl chloride.  With sterile technique and under real time ultrasound guidance:  median nerve visualized.  23g 5/8 inch needle inserted distal to proximal approach into nerve sheath. Pictures taken nfor needle placement. Patient did have injection of 0.5 cc of 0.5% Marcaine, and 0.5 cc of Kenalog  40 mg/dL. Completed without difficulty  Pain immediately resolved suggesting accurate placement of the medication.  Advised to call if fevers/chills, erythema, induration, drainage, or persistent bleeding.  Images saved Impression: Technically successful ultrasound guided injection.  Procedure: Real-time Ultrasound Guided Injection of right olecranon bursa Device: GE Logiq Q7 Ultrasound guided injection is preferred based studies that show increased duration, increased effect, greater accuracy, decreased procedural pain, increased response rate, and decreased cost with ultrasound guided versus blind injection.  Verbal informed consent obtained.  Time-out conducted.  Noted no overlying erythema, induration, or other signs of local infection.  Skin prepped in a  sterile fashion.  Local anesthesia: Topical Ethyl chloride.  With sterile technique and under real time ultrasound guidance: With an 18-gauge 1-1/2 inch needle patient was injected with 0.5 cc 0.5% Marcaine and then had aspiration of frank blood with multiple clots noted.  Injected with 1 cc of Kenalog  40 mg/mL and then continued to have significant number of clots  removed. Completed without difficulty  Pain immediately resolved suggesting accurate placement of the medication.  Advised to call if fevers/chills, erythema, induration, drainage, or persistent bleeding.  Impression: Technically successful ultrasound guided injection.    Impression and Recommendations:     The above documentation has been reviewed and is accurate and complete Teryn Gust M Kearah Gayden, DO

## 2023-12-31 ENCOUNTER — Encounter: Payer: Self-pay | Admitting: Family Medicine

## 2023-12-31 ENCOUNTER — Other Ambulatory Visit: Payer: Self-pay

## 2023-12-31 ENCOUNTER — Ambulatory Visit: Admitting: Family Medicine

## 2023-12-31 VITALS — BP 122/82 | HR 64 | Ht 71.0 in | Wt 207.0 lb

## 2023-12-31 DIAGNOSIS — M7021 Olecranon bursitis, right elbow: Secondary | ICD-10-CM

## 2023-12-31 DIAGNOSIS — M25521 Pain in right elbow: Secondary | ICD-10-CM | POA: Diagnosis not present

## 2023-12-31 DIAGNOSIS — G5601 Carpal tunnel syndrome, right upper limb: Secondary | ICD-10-CM | POA: Diagnosis not present

## 2023-12-31 NOTE — Assessment & Plan Note (Signed)
 Aspiration done today, repeat x-rays to make sure no loose bodies.  Discussed with patient about icing regimen and home exercises.  Discussed compression which patient was wrapped in Ace bandage today.  Follow-up with me again in 2 months

## 2023-12-31 NOTE — Patient Instructions (Signed)
 See me as scheduled If any signs of infection occur such as redness, swelling, heat, or pain, please seek medical attention or go into emergency room for further evaluation

## 2023-12-31 NOTE — Assessment & Plan Note (Signed)
 Attempted to release some of the retinaculum noted.  Discussed icing regimen and home exercises.  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

## 2024-01-09 ENCOUNTER — Other Ambulatory Visit: Payer: Self-pay | Admitting: Internal Medicine

## 2024-01-09 DIAGNOSIS — I480 Paroxysmal atrial fibrillation: Secondary | ICD-10-CM

## 2024-01-09 DIAGNOSIS — K219 Gastro-esophageal reflux disease without esophagitis: Secondary | ICD-10-CM

## 2024-01-09 DIAGNOSIS — I10 Essential (primary) hypertension: Secondary | ICD-10-CM

## 2024-01-15 ENCOUNTER — Other Ambulatory Visit: Payer: Self-pay | Admitting: Internal Medicine

## 2024-01-15 DIAGNOSIS — I10 Essential (primary) hypertension: Secondary | ICD-10-CM

## 2024-01-21 DIAGNOSIS — G5601 Carpal tunnel syndrome, right upper limb: Secondary | ICD-10-CM | POA: Diagnosis not present

## 2024-01-21 DIAGNOSIS — Z6828 Body mass index (BMI) 28.0-28.9, adult: Secondary | ICD-10-CM | POA: Diagnosis not present

## 2024-01-21 DIAGNOSIS — M5412 Radiculopathy, cervical region: Secondary | ICD-10-CM | POA: Diagnosis not present

## 2024-01-25 NOTE — Progress Notes (Unsigned)
 [facility] [address] [place] [address] Phone: [phone] Subjective:   [name], am serving as a scribe for Dr. Arthea Claudene.  I'm seeing this patient by the request  of:  [name], MD  CC: Right elbow, right wrist pain  YEP:Dlagzrupcz  12/31/2023 Aspiration done today, repeat x-rays to make sure no loose bodies.  Discussed with patient about icing regimen and home exercises.  Discussed compression which patient was wrapped in Ace bandage today.  Follow-up with me again in 2 months     Attempted to release some of the retinaculum noted.  Discussed icing regimen and home exercises.  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      Update 01/28/2024 John [name] is a 68 y.o. male coming in with complaint of R elbow and R wrist pain. Patient states that his 2nd and 3rd finger and thumb stay numb. Injections have not been helpful.   Neck has been doing well.   Has a nerve conduction test showing severe carpal tunnel.    Past Medical History:  Diagnosis Date   Afib (HCC)    no issues since 2018/2019   Anxiety and depression    Asthma    Complication of anesthesia    blood pressure dropped when trying to have ACDF at surgery center- aborted surgery and re-scheduled   Emphysema of lung (HCC)    GERD (gastroesophageal reflux disease)    Hernia    right inguinal   History of bronchitis    History of kidney stones    pt reports kidney stone on scan   Hyperlipidemia    Hypertension    Morton's neuroma    OA (osteoarthritis)    neck   Obstructive sleep apnea on CPAP    not using CPAP currently   Pancreatitis    Torn meniscus    Left   Past Surgical History:  Procedure Laterality Date   ANTERIOR CERVICAL DECOMP/DISCECTOMY FUSION N/A 03/29/2023   Procedure: Anterior Cervical Decompression/Discectomy Fusion, Cervical  Five-Cervical Six;  Surgeon: Debby Dorn MATSU, MD;  Location: [facility] OR;  Service: Neurosurgery;  Laterality: N/A;  3C   BONE CYST EXCISION  1991   left wrist   COLONOSCOPY     HERNIA REPAIR  04/14/2011   right inguinal   HERNIA REPAIR     left inguinal, december 2020   Social History   Socioeconomic History   Marital status: Divorced    Spouse name: Not on file   Number of children: Not on file   Years of education: Not on file   Highest education level: Not on file  Occupational History   Not on file  Tobacco Use   Smoking status: Former    Current packs/day: 0.00    Average packs/day: 1.5 packs/day for 38.0 years (57.0 ttl pk-yrs)    Types: Cigarettes    Start date: 27    Quit date: 2015    Years since quitting: 10.8   Smokeless tobacco: Never  Vaping Use   Vaping status: Every Day   Substances: Nicotine  Substance and Sexual Activity   Alcohol use: Not Currently   Drug use: Yes    Comment: Delta-8 THC gummies 2-3 times/week   Sexual activity: Yes    Partners: Female  Other Topics Concern   Not on file  Social History Narrative  Not on file   Social Drivers of Health   Financial Resource Strain: Not on file  Food Insecurity: Not on file  Transportation Needs: Not on file  Physical Activity: Not on file  Stress: Not on file  Social Connections: Not on file   Allergies  Allergen Reactions   Nsaids Other (See Comments)    Upsets colon when taken in the evening time   Aprindine Other (See Comments)    Unknown reaction   Gabapentin  Nausea And Vomiting and Palpitations    And severe anxiety and terrible nightmares    Family History  Problem Relation Age of Onset   Hypertension Mother    Dementia Other    Hypertension Sister    Hypertension Brother      Current Outpatient Medications (Cardiovascular):    carvedilol  (COREG ) 6.25 MG tablet, TAKE 1 TABLET BY MOUTH 2 TIMES DAILY WITH A MEAL.   diltiazem  (CARDIZEM  CD) 120 MG 24 hr capsule, TAKE 1  CAPSULE(120 MG) BY MOUTH DAILY   diltiazem  (CARDIZEM ) 30 MG tablet, TAKE 1 TABLET BY MOUTH EVERY 4 HOURS AS NEEDED HR ABOVE 100 AND TOP NUMBER B/P GREATER THAN 100   olmesartan  (BENICAR ) 40 MG tablet, TAKE 1 TABLET BY MOUTH EVERY DAY   rosuvastatin  (CRESTOR ) 20 MG tablet, TAKE 1 TABLET BY MOUTH EVERY DAY   sildenafil  (VIAGRA ) 25 MG tablet, Take 1 tablet (25 mg total) by mouth daily as needed for erectile dysfunction.  Current Outpatient Medications (Respiratory):    albuterol  (VENTOLIN  HFA) 108 (90 Base) MCG/ACT inhaler, Inhale 2 puffs into the lungs every 4 (four) hours as needed for wheezing or shortness of breath.   budeson-glycopyrrolate-formoterol  (BREZTRI  AEROSPHERE) 160-9-4.8 MCG/ACT AERO inhaler, Inhale 2 puffs into the lungs in the morning and at bedtime.   fluticasone  (FLONASE ) 50 MCG/ACT nasal spray, Place 2 sprays into both nostrils in the morning and at bedtime. (Patient taking differently: Place 1 spray into both nostrils in the morning and at bedtime.)   ipratropium (ATROVENT ) 0.06 % nasal spray, USE 1 SPRAY IN EACH NOSTRIL TWICE A DAY   montelukast  (SINGULAIR ) 10 MG tablet, TAKE 1 TABLET BY MOUTH EVERY DAY  Current Outpatient Medications (Analgesics):    naproxen  (NAPROSYN ) 500 MG tablet, TAKE 1 TABLET BY MOUTH TWICE A DAY AS NEEDED   traMADol  (ULTRAM ) 50 MG tablet, TAKE 1 TABLET BY MOUTH TWICE A DAY  Current Outpatient Medications (Hematological):    Ferrous Sulfate (IRON) 28 MG TABS, Take 28 mg by mouth once a week.   vitamin B-12 (CYANOCOBALAMIN ) 250 MCG tablet, Take 250 mcg by mouth daily.  Current Outpatient Medications (Other):    CHELATED MAGNESIUM  PO, Take 200 mg by mouth in the morning.   cholecalciferol (VITAMIN D3) 25 MCG (1000 UT) tablet, Take 1,000 Units by mouth every evening.   Coenzyme Q10 (COQ10) 200 MG CAPS, Take 200 mg by mouth daily.   esomeprazole  (NEXIUM ) 20 MG capsule, TAKE 1 CAPSULE (20 MG TOTAL) BY MOUTH DAILY AT 12 NOON.   Multiple Vitamins-Minerals  (MULTIVITAMIN WITH MINERALS) tablet, Take 1 tablet by mouth in the morning.   Omega-3 Fatty Acids (FISH OIL) 1000 MG CAPS, Take 1,000 mg by mouth 2 (two) times daily.   OVER THE COUNTER MEDICATION, Take 1 tablet by mouth 2 (two) times daily. Prosta-Metto supplement   OVER THE COUNTER MEDICATION, Take 1 tablet by mouth every evening. Herba vision gold supplement   POTASSIUM PO, Take 50 mg by mouth daily.   Prasterone, DHEA, (DHEA 50 PO),  Take 100 mg by mouth daily.   simethicone (MYLICON) 125 MG chewable tablet, Chew 125 mg by mouth every 6 (six) hours as needed for flatulence.   TURMERIC CURCUMIN PO, Take 800 mg by mouth 2 (two) times daily.   vitamin E 180 MG (400 UNITS) capsule, Take 400 Units by mouth daily.   Reviewed prior external information including notes and imaging from  primary care provider As well as notes that were available from care everywhere and other healthcare systems.  Past medical history, social, surgical and family history all reviewed in electronic medical record.  No pertanent information unless stated regarding to the chief complaint.   Review of Systems:  No headache, visual changes, nausea, vomiting, diarrhea, constipation, dizziness, abdominal pain, skin rash, fevers, chills, night sweats, weight loss, swollen lymph nodes, body aches, joint swelling, chest pain, shortness of breath, mood changes. POSITIVE muscle aches  Objective  Blood pressure 130/82, pulse 72, height 5' 11 (1.803 m), weight 202 lb (91.6 kg), SpO2 95%.   General: No apparent distress alert and oriented x3 mood and affect normal, dressed appropriately.  HEENT: Pupils equal, extraocular movements intact  Respiratory: Patient's speak in full sentences and does not appear short of breath  Cardiovascular: No lower extremity edema, non tender, no erythema  Hand exam shows some mild thenar eminence wasting noted.  Some mild weakness with grip strength compared to the contralateral side.  Positive  Tinel's noted.   Limited muscular skeletal ultrasound was performed and interpreted by CLAUDENE HUSSAR, M  Limited ultrasound still shows significant hypoechoic changes and dilatation noted of the median nerve.  Significant tightness noted of the retinaculum that causes compression as well.   Impression and Recommendations:    The above documentation has been reviewed and is accurate and complete Amoy Steeves M Jennamarie Goings, DO

## 2024-01-28 ENCOUNTER — Other Ambulatory Visit: Payer: Self-pay

## 2024-01-28 ENCOUNTER — Encounter: Payer: Self-pay | Admitting: Family Medicine

## 2024-01-28 ENCOUNTER — Ambulatory Visit: Admitting: Family Medicine

## 2024-01-28 VITALS — BP 130/82 | HR 72 | Ht 71.0 in | Wt 202.0 lb

## 2024-01-28 DIAGNOSIS — G5601 Carpal tunnel syndrome, right upper limb: Secondary | ICD-10-CM | POA: Diagnosis not present

## 2024-01-28 DIAGNOSIS — M25531 Pain in right wrist: Secondary | ICD-10-CM | POA: Diagnosis not present

## 2024-01-28 NOTE — Assessment & Plan Note (Signed)
 At this point diagnosed with a nerve conduction study.  Do feel that this is the only thing that is continuing to give patient difficulty.  I think at this point that surgical intervention is necessary.  Has failed multiple injections, bracing, home exercises, physical therapy.  Patient will be referred to orthopedic surgery to discuss surgical intervention

## 2024-01-28 NOTE — Patient Instructions (Addendum)
 Referral to Dr. Jerri DHEA in the morning Keep working on weight See me when you need me

## 2024-01-31 ENCOUNTER — Other Ambulatory Visit: Payer: Self-pay | Admitting: Family Medicine

## 2024-02-05 ENCOUNTER — Encounter (HOSPITAL_BASED_OUTPATIENT_CLINIC_OR_DEPARTMENT_OTHER): Payer: Self-pay | Admitting: Pulmonary Disease

## 2024-02-05 MED ORDER — TRELEGY ELLIPTA 100-62.5-25 MCG/ACT IN AEPB: 1.0000 | INHALATION_SPRAY | Freq: Every day | RESPIRATORY_TRACT | 3 refills | Status: AC

## 2024-02-05 NOTE — Telephone Encounter (Signed)
 Please advise on change back to Trelegy

## 2024-02-12 ENCOUNTER — Ambulatory Visit: Admitting: Orthopaedic Surgery

## 2024-02-12 ENCOUNTER — Encounter: Payer: Self-pay | Admitting: Orthopaedic Surgery

## 2024-02-12 DIAGNOSIS — G5601 Carpal tunnel syndrome, right upper limb: Secondary | ICD-10-CM

## 2024-02-12 NOTE — Progress Notes (Signed)
 Office Visit Note   Patient: John [name]           Date of Birth: Apr 05, 1956           MRN: [id number] Visit Date: 02/12/2024              Requested by: Claudene Arthea HERO, DO [address] [place],  [place] [address] PCP: [name], MD   Assessment & Plan: Visit Diagnoses:  1. Right carpal tunnel syndrome     Plan: History of Present Illness John [name] is a 68 year old male with severe carpal tunnel syndrome who presents with persistent symptoms despite prior treatments. He was referred by Dr. Claudene for evaluation of his carpal tunnel syndrome.  He experiences persistent pain and discomfort in his right hand due to severe carpal tunnel syndrome. He has received two carpal tunnel injections, the most recent six weeks ago, with no significant relief.  His symptoms initially included neck pain radiating to the shoulder and arm, leading to an anterior cervical discectomy and fusion in December of the previous year. A neck injection in July relieved most symptoms except those in his hand. An EMG and nerve conduction study confirmed severe carpal tunnel syndrome in the right hand.  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 He lives alone and has difficulty arranging transportation for medical procedures.  Physical Exam MUSCULOSKELETAL: No muscle wasting in the hand.  Positive carpal tunnel compressive signs.  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 Previous injections ineffective. 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 Surgery recommended. - Schedule carpal tunnel release surgery for January. - Obtain insurance authorization for surgery. - Coordinate with surgery scheduler for January dates. - Discussed anesthesia options: IV sedation with propofol   or local anesthesia. He prefers IV sedation.  Impression is severe right CTS.  Patient has attempted conservative treatment for at least 6 consecutive weeks within the past 12 weeks, including but not limited to physical therapy, home exercise program, NSAIDs, activity modification, and/or corticosteroid injections. Despite these efforts, symptoms have not improved or have worsened. Conservative measures have been deemed unsuccessful at this time. After a detailed discussion covering diagnosis and treatment options--including the risks, benefits, alternatives, and potential complications of surgical and nonsurgical management--the patient elected to proceed with surgery  Current anticoagulants: No antithrombotic Postop anticoagulation: None Diabetic: No  Prior DVT/PE: No Tobacco use: No Clearances needed for surgery: None Anticipated discharge dispo: outpatient  Follow-Up Instructions: No follow-ups on file.   Orders:  No orders of the defined types were placed in this encounter.  No orders of the defined types were placed in this encounter.     Procedures: No procedures performed   Clinical Data: No additional findings.   Subjective: Chief Complaint  Patient presents with   Right Wrist - Follow-up    Right CTS    HPI  Review of Systems  Constitutional: Negative.   HENT: Negative.    Eyes: Negative.   Respiratory: Negative.    Cardiovascular: Negative.   Gastrointestinal: Negative.   Endocrine: Negative.   Genitourinary: Negative.   Skin: Negative.   Allergic/Immunologic: Negative.   Neurological: Negative.   Hematological: Negative.   Psychiatric/Behavioral: Negative.    All other systems reviewed and are negative.    Objective: Vital Signs: There were no vitals  taken for this visit.  Physical Exam Vitals and nursing note reviewed.  Constitutional:      Appearance: He is well-developed.  HENT:     Head: Normocephalic and atraumatic.  Eyes:      Pupils: Pupils are equal, round, and reactive to light.  Pulmonary:     Effort: Pulmonary effort is normal.  Abdominal:     Palpations: Abdomen is soft.  Musculoskeletal:        General: Normal range of motion.     Cervical back: Neck supple.  Skin:    General: Skin is warm.  Neurological:     Mental Status: He is alert and oriented to person, place, and time.  Psychiatric:        Behavior: Behavior normal.        Thought Content: Thought content normal.        Judgment: Judgment normal.     Ortho Exam  Specialty Comments:  No specialty comments available.  Imaging: No results found.   PMFS History: Patient Active Problem List   Diagnosis Date Noted   Olecranon bursitis of right elbow 12/31/2023   Chronic diastolic heart failure (HCC) 11/26/2023   Recurrent major depression 11/26/2023   Tobacco user 11/26/2023   Vitamin B12 deficiency (non anemic) 11/26/2023   Vitamin D  deficiency 11/26/2023   DOE (dyspnea on exertion) 11/26/2023   Right carpal tunnel syndrome 11/05/2023   Arthritis of sacroiliac joint of both sides 07/02/2023   Chronic idiopathic constipation 05/28/2023   Encounter for general adult medical examination with abnormal findings 05/28/2023   Labral tear of long head of right biceps tendon 08/22/2022   Degenerative disc disease, lumbar 07/13/2022   Positive colorectal cancer screening using Cologuard test 06/23/2022   Myofascial pain 06/19/2022   Nerve pain due to spinal stenosis 06/19/2022   Chronic hyperglycemia 05/22/2022   Need for prophylactic vaccination and inoculation against varicella 05/22/2022   Benign prostatic hyperplasia without lower urinary tract symptoms 05/22/2022   AC (acromioclavicular) joint arthritis 04/11/2022   OA (osteoarthritis)    OSA (obstructive sleep apnea) 05/16/2021   Primary hypertension 11/25/2020   Hypogonadism, male 08/09/2020   Hyperlipidemia LDL goal <130 08/09/2020   Cervical disc disorder with radiculopathy  of cervical region 03/09/2020   Chronic low back pain with sciatica 03/04/2018   COPD (chronic obstructive pulmonary disease) (HCC) 03/04/2018   Allergic rhinitis 03/04/2018   Anxiety 03/04/2018   Erectile dysfunction 03/04/2018   GERD (gastroesophageal reflux disease) 03/04/2018   PAF (paroxysmal atrial fibrillation) (HCC) 03/28/2017   Past Medical History:  Diagnosis Date   Afib (HCC)    no issues since 2018/2019   Anxiety and depression    Asthma    Complication of anesthesia    blood pressure dropped when trying to have ACDF at surgery center- aborted surgery and re-scheduled   Emphysema of lung (HCC)    GERD (gastroesophageal reflux disease)    Hernia    right inguinal   History of bronchitis    History of kidney stones    pt reports kidney stone on scan   Hyperlipidemia    Hypertension    Morton's neuroma    OA (osteoarthritis)    neck   Obstructive sleep apnea on CPAP    not using CPAP currently   Pancreatitis    Torn meniscus    Left    Family History  Problem Relation Age of Onset   Hypertension Mother    Dementia Other    Hypertension Sister  Hypertension Brother     Past Surgical History:  Procedure Laterality Date   ANTERIOR CERVICAL DECOMP/DISCECTOMY FUSION N/A 03/29/2023   Procedure: Anterior Cervical Decompression/Discectomy Fusion, Cervical Five-Cervical Six;  Surgeon: Debby Dorn MATSU, MD;  Location: [facility];  Service: Neurosurgery;  Laterality: N/A;  3C   BONE CYST EXCISION  1991   left wrist   COLONOSCOPY     HERNIA REPAIR  04/14/2011   right inguinal   HERNIA REPAIR     left inguinal, december 2020   Social History   Occupational History   Not on file  Tobacco Use   Smoking status: Former    Current packs/day: 0.00    Average packs/day: 1.5 packs/day for 38.0 years (57.0 ttl pk-yrs)    Types: Cigarettes    Start date: 46    Quit date: 2015    Years since quitting: 10.8   Smokeless tobacco: Never  Vaping Use   Vaping status:  Every Day   Substances: Nicotine  Substance and Sexual Activity   Alcohol use: Not Currently   Drug use: Yes    Comment: Delta-8 THC gummies 2-3 times/week   Sexual activity: Yes    Partners: Female

## 2024-02-18 ENCOUNTER — Other Ambulatory Visit: Payer: Self-pay | Admitting: Internal Medicine

## 2024-02-18 DIAGNOSIS — I480 Paroxysmal atrial fibrillation: Secondary | ICD-10-CM

## 2024-02-18 DIAGNOSIS — E785 Hyperlipidemia, unspecified: Secondary | ICD-10-CM

## 2024-02-18 DIAGNOSIS — I10 Essential (primary) hypertension: Secondary | ICD-10-CM

## 2024-02-21 ENCOUNTER — Other Ambulatory Visit: Payer: Self-pay | Admitting: Internal Medicine

## 2024-02-21 DIAGNOSIS — I10 Essential (primary) hypertension: Secondary | ICD-10-CM

## 2024-02-21 DIAGNOSIS — E785 Hyperlipidemia, unspecified: Secondary | ICD-10-CM

## 2024-02-21 DIAGNOSIS — I480 Paroxysmal atrial fibrillation: Secondary | ICD-10-CM

## 2024-02-21 NOTE — Telephone Encounter (Unsigned)
 Copied from CRM #[id number]. Topic: Clinical - Prescription Issue >> Feb 21, 2024  4:32 PM Donee H wrote: Reason for CRM: Patient called to state pharmacy still has not received refill for medicationrosuvastatin (CRESTOR ) 20 MG tablet and diltiazem  (CARDIZEM  CD) 120 MG 24 hr capsule which were requested on Nov 10. Patient states he is almost out of medication and need this to be refilled as soon as he can. Please follow up on request.

## 2024-02-22 ENCOUNTER — Encounter: Payer: Self-pay | Admitting: Family Medicine

## 2024-02-22 ENCOUNTER — Telehealth: Payer: Self-pay

## 2024-02-22 MED ORDER — DILTIAZEM HCL ER COATED BEADS 120 MG PO CP24
ORAL_CAPSULE | ORAL | 0 refills | Status: AC
Start: 2024-02-22 — End: ?

## 2024-02-22 MED ORDER — ROSUVASTATIN CALCIUM 20 MG PO TABS: 20.0000 mg | ORAL_TABLET | Freq: Every day | ORAL | 0 refills | Status: AC

## 2024-02-22 NOTE — Telephone Encounter (Signed)
 Patient called with issues of his refills and said it has been an issue for years and he is over it. This week refill issue was the last straw; I apologized for his experience and that it is unacceptable that his refill came in on 11/10 and we just addressed it today.   Patient interested in finding a new provider and I shared with him [facility] male providers accepting new patients and again, apologize for his experience.

## 2024-02-22 NOTE — Telephone Encounter (Signed)
 Patient called. Wants a nurse to call him back directly to discuss his medication refills. Please call him back today as soon as a nurse is available.

## 2024-02-25 ENCOUNTER — Other Ambulatory Visit: Payer: Self-pay

## 2024-02-25 DIAGNOSIS — M25521 Pain in right elbow: Secondary | ICD-10-CM

## 2024-02-27 ENCOUNTER — Ambulatory Visit: Admitting: Family Medicine

## 2024-02-27 ENCOUNTER — Ambulatory Visit (INDEPENDENT_AMBULATORY_CARE_PROVIDER_SITE_OTHER)

## 2024-02-27 ENCOUNTER — Other Ambulatory Visit: Payer: Self-pay

## 2024-02-27 VITALS — BP 116/72 | HR 78 | Ht 71.0 in | Wt 198.0 lb

## 2024-02-27 DIAGNOSIS — M25521 Pain in right elbow: Secondary | ICD-10-CM | POA: Diagnosis not present

## 2024-02-27 DIAGNOSIS — M7021 Olecranon bursitis, right elbow: Secondary | ICD-10-CM | POA: Diagnosis not present

## 2024-02-27 DIAGNOSIS — M5136 Other intervertebral disc degeneration, lumbar region with discogenic back pain only: Secondary | ICD-10-CM

## 2024-02-27 DIAGNOSIS — M7989 Other specified soft tissue disorders: Secondary | ICD-10-CM | POA: Diagnosis not present

## 2024-02-27 MED ORDER — DOXYCYCLINE HYCLATE 100 MG PO TABS: 100.0000 mg | ORAL_TABLET | Freq: Two times a day (BID) | ORAL | 0 refills | Status: DC

## 2024-02-27 MED ORDER — METHYLPREDNISOLONE ACETATE 40 MG/ML IJ SUSP
40.0000 mg | Freq: Once | INTRAMUSCULAR | Status: AC
  Administered 2024-02-27: 40 mg via INTRAMUSCULAR

## 2024-02-27 MED ORDER — KETOROLAC TROMETHAMINE 30 MG/ML IJ SOLN
30.0000 mg | Freq: Once | INTRAMUSCULAR | Status: AC
  Administered 2024-02-27: 30 mg via INTRAMUSCULAR

## 2024-02-27 NOTE — Progress Notes (Signed)
 [facility] [address] [place] [address] Phone: [phone] Subjective:   LILLETTE Berwyn Posey, am serving as a scribe for Dr. Arthea Claudene.  I'm seeing this patient by the request  of:  [name], MD  CC: Elbow pain and swelling follow-up.  YEP:Dlagzrupcz  ATHANASIOS HELDMAN is a 68 y.o. male coming in with complaint of found to have an olecranon bursitis previously.  Aspirated September 22.  Was a hemorrhagic bursitis.  Patient states that he has icrease in swelling in elbow.        Past Medical History:  Diagnosis Date   Afib (HCC)    no issues since 2018/2019   Anxiety and depression    Asthma    Complication of anesthesia    blood pressure dropped when trying to have ACDF at surgery center- aborted surgery and re-scheduled   Emphysema of lung (HCC)    GERD (gastroesophageal reflux disease)    Hernia    right inguinal   History of bronchitis    History of kidney stones    pt reports kidney stone on scan   Hyperlipidemia    Hypertension    Morton's neuroma    OA (osteoarthritis)    neck   Obstructive sleep apnea on CPAP    not using CPAP currently   Pancreatitis    Torn meniscus    Left   Past Surgical History:  Procedure Laterality Date   ANTERIOR CERVICAL DECOMP/DISCECTOMY FUSION N/A 03/29/2023   Procedure: Anterior Cervical Decompression/Discectomy Fusion, Cervical Five-Cervical Six;  Surgeon: Debby Dorn MATSU, MD;  Location: [facility] OR;  Service: Neurosurgery;  Laterality: N/A;  3C   BONE CYST EXCISION  1991   left wrist   COLONOSCOPY     HERNIA REPAIR  04/14/2011   right inguinal   HERNIA REPAIR     left inguinal, december 2020   Social History   Socioeconomic History   Marital status: Divorced    Spouse name: Not on file   Number of children: Not on file   Years of education: Not on file   Highest education level: Not on file  Occupational History   Not on file  Tobacco Use   Smoking status: Former     Current packs/day: 0.00    Average packs/day: 1.5 packs/day for 38.0 years (57.0 ttl pk-yrs)    Types: Cigarettes    Start date: 67    Quit date: 2015    Years since quitting: 10.8   Smokeless tobacco: Never  Vaping Use   Vaping status: Every Day   Substances: Nicotine  Substance and Sexual Activity   Alcohol use: Not Currently   Drug use: Yes    Comment: Delta-8 THC gummies 2-3 times/week   Sexual activity: Yes    Partners: Female  Other Topics Concern   Not on file  Social History Narrative   Not on file   Social Drivers of Health   Financial Resource Strain: Not on file  Food Insecurity: Not on file  Transportation Needs: Not on file  Physical Activity: Not on file  Stress: Not on file  Social Connections: Not on file   Allergies  Allergen Reactions   Nsaids Other (See Comments)    Upsets colon when taken in the evening time   Aprindine Other (See Comments)    Unknown reaction   Gabapentin  Nausea And Vomiting and Palpitations    And severe anxiety and terrible nightmares    Family History  Problem Relation Age of Onset   Hypertension Mother    Dementia Other    Hypertension Sister    Hypertension Brother      Current Outpatient Medications (Cardiovascular):    carvedilol  (COREG ) 6.25 MG tablet, TAKE 1 TABLET BY MOUTH 2 TIMES DAILY WITH A MEAL.   diltiazem  (CARDIZEM  CD) 120 MG 24 hr capsule, TAKE 1 CAPSULE(120 MG) BY MOUTH DAILY   diltiazem  (CARDIZEM ) 30 MG tablet, TAKE 1 TABLET BY MOUTH EVERY 4 HOURS AS NEEDED HR ABOVE 100 AND TOP NUMBER B/P GREATER THAN 100   olmesartan  (BENICAR ) 40 MG tablet, TAKE 1 TABLET BY MOUTH EVERY DAY   rosuvastatin  (CRESTOR ) 20 MG tablet, Take 1 tablet (20 mg total) by mouth daily.   sildenafil  (VIAGRA ) 25 MG tablet, Take 1 tablet (25 mg total) by mouth daily as needed for erectile dysfunction.  Current Outpatient Medications (Respiratory):    albuterol  (VENTOLIN  HFA) 108 (90 Base) MCG/ACT inhaler, Inhale 2 puffs into the lungs  every 4 (four) hours as needed for wheezing or shortness of breath.   budeson-glycopyrrolate-formoterol  (BREZTRI  AEROSPHERE) 160-9-4.8 MCG/ACT AERO inhaler, Inhale 2 puffs into the lungs in the morning and at bedtime.   fluticasone  (FLONASE ) 50 MCG/ACT nasal spray, Place 2 sprays into both nostrils in the morning and at bedtime. (Patient taking differently: Place 1 spray into both nostrils in the morning and at bedtime.)   Fluticasone -Umeclidin-Vilant (TRELEGY ELLIPTA ) 100-62.5-25 MCG/ACT AEPB, Inhale 1 puff into the lungs daily.   ipratropium (ATROVENT ) 0.06 % nasal spray, USE 1 SPRAY IN EACH NOSTRIL TWICE A DAY   montelukast  (SINGULAIR ) 10 MG tablet, TAKE 1 TABLET BY MOUTH EVERY DAY  Current Outpatient Medications (Analgesics):    naproxen  (NAPROSYN ) 500 MG tablet, TAKE 1 TABLET BY MOUTH TWICE A DAY AS NEEDED   traMADol  (ULTRAM ) 50 MG tablet, TAKE 1 TABLET BY MOUTH TWICE A DAY  Current Outpatient Medications (Hematological):    Ferrous Sulfate (IRON) 28 MG TABS, Take 28 mg by mouth once a week.   vitamin B-12 (CYANOCOBALAMIN ) 250 MCG tablet, Take 250 mcg by mouth daily.  Current Outpatient Medications (Other):    CHELATED MAGNESIUM  PO, Take 200 mg by mouth in the morning.   cholecalciferol (VITAMIN D3) 25 MCG (1000 UT) tablet, Take 1,000 Units by mouth every evening.   Coenzyme Q10 (COQ10) 200 MG CAPS, Take 200 mg by mouth daily.   esomeprazole  (NEXIUM ) 20 MG capsule, TAKE 1 CAPSULE (20 MG TOTAL) BY MOUTH DAILY AT 12 NOON.   Multiple Vitamins-Minerals (MULTIVITAMIN WITH MINERALS) tablet, Take 1 tablet by mouth in the morning.   Omega-3 Fatty Acids (FISH OIL) 1000 MG CAPS, Take 1,000 mg by mouth 2 (two) times daily.   OVER THE COUNTER MEDICATION, Take 1 tablet by mouth 2 (two) times daily. Prosta-Metto supplement   OVER THE COUNTER MEDICATION, Take 1 tablet by mouth every evening. Herba vision gold supplement   POTASSIUM PO, Take 50 mg by mouth daily.   Prasterone, DHEA, (DHEA 50 PO), Take  100 mg by mouth daily.   simethicone (MYLICON) 125 MG chewable tablet, Chew 125 mg by mouth every 6 (six) hours as needed for flatulence.   TURMERIC CURCUMIN PO, Take 800 mg by mouth 2 (two) times daily.   vitamin E 180 MG (400 UNITS) capsule, Take 400 Units by mouth daily.   Reviewed prior external information including notes and imaging from  primary care provider As well as notes that were available from care everywhere and other healthcare systems.  Past medical history, social, surgical and family history all reviewed in electronic medical record.  No pertanent information unless stated regarding to the chief complaint.   Review of Systems:  No headache, visual changes, nausea, vomiting, diarrhea, constipation, dizziness, abdominal pain, skin rash, fevers, chills, night sweats, weight loss, swollen lymph nodes, body aches, joint swelling, chest pain, shortness of breath, mood changes. POSITIVE muscle aches  Objective  There were no vitals taken for this visit.   General: No apparent distress alert and oriented x3 mood and affect normal, dressed appropriately.  HEENT: Pupils equal, extraocular movements intact  Respiratory: Patient's speak in full sentences and does not appear short of breath  Cardiovascular: No lower extremity edema, non tender, no erythema  Elbow exam shows swelling noted over the olecranon bursa.  No erythema or any inflammation noted.  Procedure: Real-time Ultrasound Guided Injection of right olecranon bursa sac Device: GE Logiq Q7 Ultrasound guided injection is preferred based studies that show increased duration, increased effect, greater accuracy, decreased procedural pain, increased response rate, and decreased cost with ultrasound guided versus blind injection.  Verbal informed consent obtained.  Time-out conducted.  Noted no overlying erythema, induration, or other signs of local infection.  Skin prepped in a sterile fashion.  Local anesthesia: Topical  Ethyl chloride.  With sterile technique and under real time ultrasound guidance: With an 18-gauge 1 inch needle injected with 0.5 cc of 0.5% Marcaine and aspirated 20 cc of frank blood.  Expressed out clots.  Patient then was injected with 1 cc of Kenalog  40 mg/mL Completed without difficulty  Pain immediately resolved suggesting accurate placement of the medication.  Advised to call if fevers/chills, erythema, induration, drainage, or persistent bleeding.  Images saved Impression: Technically successful ultrasound guided injection.    Impression and Recommendations:    The above documentation has been reviewed and is accurate and complete Antwyne Pingree M Kiaja Shorty, DO

## 2024-02-27 NOTE — Patient Instructions (Addendum)
 Aspiration of elbow Half cocktail injection in backside today. Doxy if needed See me again in 8 weeks

## 2024-02-27 NOTE — Assessment & Plan Note (Signed)
 No abnormal bony abnormality noted.  Patient did have another hemorrhagic bursitis noted.  Discussed with patient about icing regimen and home exercises, discussed which activities to do and which ones to avoid.  Increase activity slowly.  Follow-up again in 6 to 8 weeks.

## 2024-02-27 NOTE — Assessment & Plan Note (Addendum)
 Patient brings up the potential for the injections in the epidural of the lower back.  Has responded to these previously.  Hopeful not necessary but will see. Depo and toradol  given

## 2024-02-29 ENCOUNTER — Encounter: Payer: Self-pay | Admitting: Orthopaedic Surgery

## 2024-03-17 ENCOUNTER — Encounter: Admitting: Student in an Organized Health Care Education/Training Program

## 2024-03-25 ENCOUNTER — Other Ambulatory Visit: Payer: Self-pay

## 2024-03-25 MED ORDER — METHYLPREDNISOLONE 4 MG PO TBPK: ORAL_TABLET | ORAL | 0 refills | Status: DC

## 2024-03-27 ENCOUNTER — Other Ambulatory Visit: Payer: Self-pay | Admitting: Internal Medicine

## 2024-03-27 ENCOUNTER — Ambulatory Visit (INDEPENDENT_AMBULATORY_CARE_PROVIDER_SITE_OTHER): Admitting: Student in an Organized Health Care Education/Training Program

## 2024-03-27 ENCOUNTER — Encounter: Payer: Self-pay | Admitting: Student in an Organized Health Care Education/Training Program

## 2024-03-27 VITALS — BP 165/82 | HR 81 | Ht 69.0 in | Wt 198.0 lb

## 2024-03-27 DIAGNOSIS — F331 Major depressive disorder, recurrent, moderate: Secondary | ICD-10-CM

## 2024-03-27 DIAGNOSIS — R7303 Prediabetes: Secondary | ICD-10-CM

## 2024-03-27 DIAGNOSIS — I10 Essential (primary) hypertension: Secondary | ICD-10-CM | POA: Diagnosis not present

## 2024-03-27 DIAGNOSIS — Z7952 Long term (current) use of systemic steroids: Secondary | ICD-10-CM

## 2024-03-27 DIAGNOSIS — M5010 Cervical disc disorder with radiculopathy, unspecified cervical region: Secondary | ICD-10-CM | POA: Diagnosis not present

## 2024-03-27 LAB — POCT GLYCOSYLATED HEMOGLOBIN (HGB A1C): Hemoglobin A1C: 5.2 % (ref 4.0–5.6)

## 2024-03-27 NOTE — Patient Instructions (Signed)
°  VISIT SUMMARY: Today, we discussed your ongoing neck pain, prediabetes management, and other health concerns. We reviewed your current medications, lifestyle changes, and planned further evaluations to help manage your conditions effectively.  YOUR PLAN: -CERVICAL SPONDYLOSIS WITH POST-SURGICAL SYNDROME: Cervical spondylosis is a condition involving wear and tear of the spinal discs in your neck. Despite your previous surgery, your neck pain persists. We will order a DEXA scan to check your bone density and consider another epidural injection for pain relief.  -CARPAL TUNNEL SYNDROME: Carpal tunnel syndrome is a condition that causes pain, numbness, and tingling in the hand and arm. We acknowledge your ongoing symptoms and will continue to monitor this condition.  -LUMBAR DISC DISEASE WITH RADICULOPATHY: Lumbar disc disease involves the deterioration of the discs in your lower back, which can cause pain and nerve issues. We are considering an epidural injection to help relieve your symptoms.  -PREDIABETES: Prediabetes means your blood sugar levels are higher than normal but not yet high enough to be diagnosed as diabetes. Your lifestyle changes have been effective in managing your weight and blood sugar. Continue these changes, and we will recheck your A1c in two months.  -HYPERTENSION: Hypertension, or high blood pressure, was slightly elevated today but is usually normal at home. Continue monitoring your blood pressure regularly, and we will maintain your current management plan.  -GERD WITH HIATAL HERNIA: GERD is a condition where stomach acid frequently flows back into the tube connecting your mouth and stomach, often due to a hiatal hernia. Continue taking Nexium  as prescribed to manage your symptoms.  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 We will aim to minimize your use of corticosteroids to reduce this  effect.  -DEPRESSION: Depression can affect your mood and overall well-being. Engaging in social activities can help improve your mood. Please try to stay connected with others and participate in activities you enjoy.  -GENERAL HEALTH MAINTENANCE: We will conduct a bone density scan and schedule a follow-up appointment in two months to review your overall health and perform necessary blood work.  INSTRUCTIONS: Please schedule a DEXA scan to assess your bone density. Continue with your current lifestyle changes and monitor your blood pressure regularly. We will recheck your A1c in two months. Your follow-up appointment is scheduled in two months for a comprehensive review and blood work.

## 2024-03-27 NOTE — Progress Notes (Signed)
 Established Patient Office Visit  Patient ID: John [name], male    DOB: 09-20-1955  Age: 68 y.o. MRN: [id number] PCP: Jerrell Cleatus Ned, MD  Chief Complaint  Patient presents with   Transitions Of Care    Patient brought in paper of concerns.     Subjective:     HPI  Discussed the use of AI scribe software for clinical note transcription with the patient, who gave verbal consent to proceed.  History of Present Illness John [name] is a 68 year old male with cervical spine issues and prediabetes who presents for evaluation of neck pain and management of prediabetes.  He has significant neck pain and works in aeronautical engineer, which involves physical activity. Despite undergoing ACDF surgery a year ago, his symptoms persist and worsen when he stops working or lies down, particularly on the couch. Epidural injections provided relief until about a month ago when symptoms began to flare up again. He is currently taking a Medrol  dose pack.  He was told by his previous primary care physician that he has prediabetes. Dietary changes, including reducing soda intake and switching to whole grains, have helped him lose weight from 217 pounds to a range of 193-195 pounds. He is concerned about managing his sugar intake and the impact of corticosteroids on his condition.  He has been on corticosteroids for his neck issues, which have contributed to his prediabetes and thin skin. He is concerned about the long-term effects of these medications, including osteoporosis, especially since he has been taking Nexium  for GERD, which may affect bone density. He takes B12 supplements and needs to monitor his bone health.  He monitors his blood pressure regularly at home, noting it is usually within range. He has a history of a hiatal hernia and has undergone two hernia surgeries in the past ten years.  He lives alone and has limited family support, which complicates his ability to manage his health  and recovery from surgeries. He has a history of depression, particularly around the holiday season, and a lack of close family ties affects his mood. He tries to stay mentally active through trivia and social interactions when possible.  He has a history of lumbar spine issues, including a slightly herniated disc at L4-5, which has been managed with epidural injections in the past. He also reports a history of two hernia surgeries and concerns about his ability to continue working due to his physical health challenges.      Objective:     BP (!) 165/82   Pulse 81   Ht 5' 9 (1.753 m)   Wt 198 lb (89.8 kg)   SpO2 97%   BMI 29.24 kg/m   Physical Exam  Gen: Well-appearing man Neck: Normal thyroid , no nodules or adenopathy Heart: Regular, no murmur Lungs: Unlabored, clear throughout Abd: Soft, nontender, obese, no hernia Ext: Warm, no edema   Results for orders placed or performed in visit on 03/27/24  POCT glycosylated hemoglobin (Hb A1C)  Result Value Ref Range   Hemoglobin A1C 5.2 4.0 - 5.6 %   HbA1c POC (<> result, manual entry)     HbA1c, POC (prediabetic range)     HbA1c, POC (controlled diabetic range)        Assessment & Plan:   Problem List Items Addressed This Visit       High   Primary hypertension - Primary (Chronic)   Intermittent issue.  Blood pressure elevated today above goal.  Currently using olmesartan ,  diltiazem , and carvedilol .  May benefit from a diuretic in the future.  Follow-up with me in 2 months for blood pressure recheck.  I recommended home blood pressure readings, bring the log in so we can review them together for the average blood pressures.      Recurrent major depression (Chronic)   Moderate mood disorder for this person undergoing chronic pain syndromes from osteoarthritis, he is lacking family support at home, and has financial hardship.  Has tried Cymbalta  in the past but could not tolerate side effects.  I wonder if a different SSRI  or SNRI would be helpful for his mood and chronic pain experience.  He is very open to talking, so I also wonder if counseling would be helpful for him.  Will follow-up with him in 2 months.        Medium    Prediabetes (Chronic)   Recently diagnosed with prediabetes, likely influenced by corticosteroid use and diet. Lifestyle changes have led to weight loss.  A1c is much improved today to 5.2%.  Will recommend rechecking this every 6-12 months given his frequent exposure to corticosteroids.      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 Previous epidural injections were effective.  This is being managed with his orthopedists.  I recommended against the use of systemic steroids in the future as he is showing side effects including obesity, hyperglycemia, senile purpura.  We talked about reasonable expectations and the difficulty of this diagnosis, no easy solutions.  Ultimately I recommended focusing on function rather than pain experience, and reasonable expectations.  He was not able to tolerate Cymbalta  in the past, but I suspect perhaps another SNRI would be helpful.      Long term (current) use of systemic steroids (Chronic)   Very frequent intermittent use of short acting corticosteroids and occluding prednisone and methylprednisolone  to treat osteoarthritis in his cervical and lumbar spines.  He showing many complications of the steroid exposure including central obesity, thinning of the skin, mood irregularities, and hyperglycemia.  I recommended DEXA scan to screen for secondary osteoporosis given his frequent exposure to steroids.      Relevant Orders   DG Bone Density    Cleatus Debby Specking, MD [facility]

## 2024-03-27 NOTE — Assessment & Plan Note (Signed)
 Moderate mood disorder for this person undergoing chronic pain syndromes from osteoarthritis, he is lacking family support at home, and has financial hardship.  Has tried Cymbalta  in the past but could not tolerate side effects.  I wonder if a different SSRI or SNRI would be helpful for his mood and chronic pain experience.  He is very open to talking, so I also wonder if counseling would be helpful for him.  Will follow-up with him in 2 months.

## 2024-03-27 NOTE — Assessment & Plan Note (Signed)
 Recently diagnosed with prediabetes, likely influenced by corticosteroid use and diet. Lifestyle changes have led to weight loss.  A1c is much improved today to 5.2%.  Will recommend rechecking this every 6-12 months given his frequent exposure to corticosteroids.

## 2024-03-27 NOTE — Assessment & Plan Note (Signed)
 Very frequent intermittent use of short acting corticosteroids and occluding prednisone and methylprednisolone  to treat osteoarthritis in his cervical and lumbar spines.  He showing many complications of the steroid exposure including central obesity, thinning of the skin, mood irregularities, and hyperglycemia.  I recommended DEXA scan to screen for secondary osteoporosis given his frequent exposure to steroids.

## 2024-03-27 NOTE — Assessment & Plan Note (Signed)
 Intermittent issue.  Blood pressure elevated today above goal.  Currently using olmesartan , diltiazem , and carvedilol .  May benefit from a diuretic in the future.  Follow-up with me in 2 months for blood pressure recheck.  I recommended home blood pressure readings, bring the log in so we can review them together for the average blood pressures.

## 2024-03-27 NOTE — Assessment & Plan Note (Signed)
 Chronic cervical spondylosis persists post-ACDF surgery, with neck pain worsened by certain activities. Previous epidural injections were effective.  This is being managed with his orthopedists.  I recommended against the use of systemic steroids in the future as he is showing side effects including obesity, hyperglycemia, senile purpura.  We talked about reasonable expectations and the difficulty of this diagnosis, no easy solutions.  Ultimately I recommended focusing on function rather than pain experience, and reasonable expectations.  He was not able to tolerate Cymbalta  in the past, but I suspect perhaps another SNRI would be helpful.

## 2024-04-09 ENCOUNTER — Other Ambulatory Visit: Payer: Self-pay | Admitting: Internal Medicine

## 2024-04-09 ENCOUNTER — Other Ambulatory Visit: Payer: Self-pay | Admitting: Physician Assistant

## 2024-04-09 DIAGNOSIS — K219 Gastro-esophageal reflux disease without esophagitis: Secondary | ICD-10-CM

## 2024-04-09 MED ORDER — ONDANSETRON HCL 4 MG PO TABS: 4.0000 mg | ORAL_TABLET | Freq: Three times a day (TID) | ORAL | 0 refills | Status: AC | PRN

## 2024-04-09 MED ORDER — HYDROCODONE-ACETAMINOPHEN 5-325 MG PO TABS: 1.0000 | ORAL_TABLET | Freq: Three times a day (TID) | ORAL | 0 refills | Status: AC | PRN

## 2024-04-10 ENCOUNTER — Other Ambulatory Visit: Payer: Self-pay | Admitting: Internal Medicine

## 2024-04-10 DIAGNOSIS — I10 Essential (primary) hypertension: Secondary | ICD-10-CM

## 2024-04-11 ENCOUNTER — Encounter: Payer: Self-pay | Admitting: Student in an Organized Health Care Education/Training Program

## 2024-04-11 DIAGNOSIS — K219 Gastro-esophageal reflux disease without esophagitis: Secondary | ICD-10-CM

## 2024-04-11 DIAGNOSIS — I10 Essential (primary) hypertension: Secondary | ICD-10-CM

## 2024-04-11 MED ORDER — ESOMEPRAZOLE MAGNESIUM 20 MG PO CPDR: 20.0000 mg | DELAYED_RELEASE_CAPSULE | Freq: Every day | ORAL | 1 refills | Status: AC

## 2024-04-11 MED ORDER — OLMESARTAN MEDOXOMIL 40 MG PO TABS: 40.0000 mg | ORAL_TABLET | Freq: Every day | ORAL | 3 refills | Status: AC

## 2024-04-11 NOTE — Telephone Encounter (Signed)
 Okay with refilling Olmesartan  & Esomeprazole ? I tried to pend medication but it would not let me.

## 2024-04-11 NOTE — Telephone Encounter (Signed)
 I have refills olmesartan  and Nexium .  Thank you.

## 2024-04-14 DIAGNOSIS — M5136 Other intervertebral disc degeneration, lumbar region with discogenic back pain only: Secondary | ICD-10-CM

## 2024-04-14 DIAGNOSIS — M19011 Primary osteoarthritis, right shoulder: Secondary | ICD-10-CM

## 2024-04-14 DIAGNOSIS — M150 Primary generalized (osteo)arthritis: Secondary | ICD-10-CM

## 2024-04-14 DIAGNOSIS — M4800 Spinal stenosis, site unspecified: Secondary | ICD-10-CM

## 2024-04-14 DIAGNOSIS — M461 Sacroiliitis, not elsewhere classified: Secondary | ICD-10-CM

## 2024-04-14 DIAGNOSIS — G8929 Other chronic pain: Secondary | ICD-10-CM

## 2024-04-14 NOTE — Telephone Encounter (Signed)
 Requested Prescriptions   Pending Prescriptions Disp Refills   traMADol  (ULTRAM ) 50 MG tablet [Pharmacy Med Name: TRAMADOL  HCL 50 MG TABLET] 120 tablet 1    Sig: TAKE 1 TABLET BY MOUTH TWICE A DAY     Date of patient request: 04/14/24 Last office visit: 03/27/2024 Upcoming visit: 05/28/2024 Date of last refill:  Last refill amount: 120 w/ 1 refill

## 2024-04-17 ENCOUNTER — Other Ambulatory Visit: Payer: Self-pay | Admitting: Physician Assistant

## 2024-04-17 DIAGNOSIS — G5601 Carpal tunnel syndrome, right upper limb: Secondary | ICD-10-CM | POA: Diagnosis not present

## 2024-04-24 ENCOUNTER — Ambulatory Visit: Admitting: Family Medicine

## 2024-04-24 ENCOUNTER — Ambulatory Visit: Admitting: Physician Assistant

## 2024-04-24 DIAGNOSIS — M65331 Trigger finger, right middle finger: Secondary | ICD-10-CM | POA: Diagnosis not present

## 2024-04-24 DIAGNOSIS — Z9889 Other specified postprocedural states: Secondary | ICD-10-CM

## 2024-04-24 DIAGNOSIS — G5601 Carpal tunnel syndrome, right upper limb: Secondary | ICD-10-CM

## 2024-04-24 MED ORDER — BUPIVACAINE HCL 0.25 % IJ SOLN
0.3300 mL | INTRAMUSCULAR | Status: AC | PRN
  Administered 2024-04-24: .33 mL

## 2024-04-24 MED ORDER — METHYLPREDNISOLONE ACETATE 40 MG/ML IJ SUSP
13.3300 mg | INTRAMUSCULAR | Status: AC | PRN
  Administered 2024-04-24: 13.33 mg

## 2024-04-24 MED ORDER — LIDOCAINE HCL 1 % IJ SOLN
1.0000 mL | INTRAMUSCULAR | Status: AC | PRN
  Administered 2024-04-24: 1 mL

## 2024-04-24 NOTE — Progress Notes (Signed)
 "  Office Visit Note   Patient: John [name]           Date of Birth: 28-Oct-1955           MRN: [id number] Visit Date: 04/24/2024              Requested by: [name], MD [address] [place],  [place] [address] PCP: Jerrell Cleatus Debby, MD   Assessment & Plan: Visit Diagnoses:  1. Trigger finger, right middle finger   2. Right carpal tunnel syndrome   3. S/P carpal tunnel release     Plan: Impression is 1 week status post right carpal tunnel release for severe compression of median nerve and right long trigger finger.  Regards to the carpal tunnel release.  We have cleaned and covered his incision.  We have provided him with a Velcro splint for which she will wear at all times for the next week.  He we will work on nerve gliding exercises.  No heavy lifting or submerging his hand underwater for 3 weeks.  Follow-up next week for suture removal.  Regards to the trigger finger, we have discussed repeat cortisone injection as he has had great relief from these in the past.  We have also provided him with a night splint to wear for the next week.  Follow-up as needed for the trigger finger.  Follow-Up Instructions: Return in about 1 week (around 05/01/2024).   Orders:  Orders Placed This Encounter  Procedures   Hand/UE Inj: R long A1   No orders of the defined types were placed in this encounter.     Procedures: Hand/UE Inj: R long A1 for trigger finger on 04/24/2024 10:15 AM Indications: pain Details: 25 G needle Medications: 1 mL lidocaine  1 %; 0.33 mL bupivacaine  0.25 %; 13.33 mg methylPREDNISolone  acetate 40 MG/ML      Clinical Data: No additional findings.   Subjective: Chief Complaint  Patient presents with   Right Hand - Pain    R CTR-04/17/2024    HPI patient is a very pleasant 69 year old gentleman who comes in today 1 week status post right carpal tunnel release for severe compression of median nerve.  He is doing okay.  Pain has improved but he  continues to have numbness to the thumb, index and long fingers.  He does note the numbness in the thumb has worsened since surgery although the numbness in the index and long fingers has improved since surgery.  He has been taking naproxen  as needed.  He is also mentioning triggering to the right long finger.  History of trigger finger to this finger for several years and has had 2 different injections within the past several years which have seemed to help.  He is not a diabetic.  Review of Systems as detailed in HPI.  All others reviewed and are negative.   Objective: Vital Signs: There were no vitals taken for this visit.  Physical Exam well-developed well-nourished gentleman in no acute distress.  Alert oriented x 3.  Ortho Exam right hand exam: Well-healing surgical incision with nylon sutures in place.  No evidence of infection or cellulitis.  Fingers warm well-perfused.  He does have decree sensation to the thumb, index and long fingers.  He does have tenderness and a small palpable nodule at the A1 pulley of the long finger with reproducible triggering.  Specialty Comments:  No specialty comments available.  Imaging: No new imaging   PMFS History: Patient Active Problem List  Diagnosis Date Noted   Long term (current) use of systemic steroids 03/27/2024   Prediabetes 03/27/2024   Chronic diastolic heart failure (HCC) 11/26/2023   Recurrent major depression 11/26/2023   Vitamin B12 deficiency (non anemic) 11/26/2023   Vitamin D  deficiency 11/26/2023   Right carpal tunnel syndrome 11/05/2023   OSA (obstructive sleep apnea) 05/16/2021   Primary hypertension 11/25/2020   Hyperlipidemia LDL goal <130 08/09/2020   Cervical disc disorder with radiculopathy of cervical region 03/09/2020   Chronic low back pain with sciatica 03/04/2018   COPD (chronic obstructive pulmonary disease) (HCC) 03/04/2018   Erectile dysfunction 03/04/2018   GERD (gastroesophageal reflux disease)  03/04/2018   PAF (paroxysmal atrial fibrillation) (HCC) 03/28/2017   Past Medical History:  Diagnosis Date   Afib (HCC)    no issues since 2018/2019   Anxiety and depression    Asthma    Benign prostatic hyperplasia without lower urinary tract symptoms 05/22/2022   Complication of anesthesia    blood pressure dropped when trying to have ACDF at surgery center- aborted surgery and re-scheduled   Emphysema of lung (HCC)    GERD (gastroesophageal reflux disease)    Hernia    right inguinal   History of bronchitis    History of kidney stones    pt reports kidney stone on scan   Hyperlipidemia    Hypertension    Hypogonadism, male 08/09/2020   Labral tear of long head of right biceps tendon 08/22/2022   Injection given Aug 22, 2022     Morton's neuroma    OA (osteoarthritis)    neck   Obstructive sleep apnea on CPAP    not using CPAP currently   Pancreatitis    Positive colorectal cancer screening using Cologuard test 06/23/2022   Tobacco user 11/26/2023   Torn meniscus    Left    Family History  Problem Relation Age of Onset   Hypertension Mother    Dementia Other    Hypertension Sister    Hypertension Brother     Past Surgical History:  Procedure Laterality Date   ANTERIOR CERVICAL DECOMP/DISCECTOMY FUSION N/A 03/29/2023   Procedure: Anterior Cervical Decompression/Discectomy Fusion, Cervical Five-Cervical Six;  Surgeon: Debby Dorn MATSU, MD;  Location: [facility] OR;  Service: Neurosurgery;  Laterality: N/A;  3C   BONE CYST EXCISION  1991   left wrist   COLONOSCOPY     HERNIA REPAIR  04/14/2011   right inguinal   HERNIA REPAIR     left inguinal, december 2020   Social History   Occupational History   Not on file  Tobacco Use   Smoking status: Former    Current packs/day: 0.00    Average packs/day: 1.5 packs/day for 38.0 years (57.0 ttl pk-yrs)    Types: Cigarettes    Start date: 72    Quit date: 2015    Years since quitting: 11.0   Smokeless tobacco: Never   Vaping Use   Vaping status: Every Day   Substances: Nicotine  Substance and Sexual Activity   Alcohol use: Not Currently   Drug use: Yes    Comment: Delta-8 THC gummies 2-3 times/week   Sexual activity: Yes    Partners: Female        "

## 2024-05-01 ENCOUNTER — Ambulatory Visit: Admitting: Physician Assistant

## 2024-05-01 DIAGNOSIS — G5601 Carpal tunnel syndrome, right upper limb: Secondary | ICD-10-CM

## 2024-05-01 NOTE — Progress Notes (Signed)
 "  Post-Op Visit Note   Patient: John [name]           Date of Birth: [birth year]           MRN: [id number] Visit Date: 05/01/2024 PCP: Jerrell Cleatus Ned, MD   Assessment & Plan:  Chief Complaint:  Chief Complaint  Patient presents with   Right Hand - Pain    04/17/2024-R CTR/R LONG TRIGGER FINGER   Visit Diagnoses:  1. Right carpal tunnel syndrome     Plan: Patient is a pleasant 69 year old gentleman who comes in today 2 weeks status post right carpal tunnel release for severe compression of median nerve.  He is doing better.  He does not have any pain but has noticed slightly decreased numbness.  Examination of the right hand reveals a well-healed surgical incision with nylon sutures in place.  No evidence of infection or cellulitis.  Does have decree sensation throughout the median nerve distribution.  Today, sutures were removed and Steri-Strips applied.  No heavy lifting or submerging his hand underwater for another 2 weeks.  Follow-up in 2 weeks for recheck.  Call with concerns or questions.  Follow-Up Instructions: Return in about 2 weeks (around 05/15/2024).   Orders:  No orders of the defined types were placed in this encounter.  No orders of the defined types were placed in this encounter.   Imaging: No new imaging  PMFS History: Patient Active Problem List   Diagnosis Date Noted   Long term (current) use of systemic steroids 03/27/2024   Prediabetes 03/27/2024   Chronic diastolic heart failure (HCC) 11/26/2023   Recurrent major depression 11/26/2023   Vitamin B12 deficiency (non anemic) 11/26/2023   Vitamin D  deficiency 11/26/2023   Right carpal tunnel syndrome 11/05/2023   OSA (obstructive sleep apnea) 05/16/2021   Primary hypertension 11/25/2020   Hyperlipidemia LDL goal <130 08/09/2020   Cervical disc disorder with radiculopathy of cervical region 03/09/2020   Chronic low back pain with sciatica 03/04/2018   COPD (chronic obstructive pulmonary disease)  (HCC) 03/04/2018   Erectile dysfunction 03/04/2018   GERD (gastroesophageal reflux disease) 03/04/2018   PAF (paroxysmal atrial fibrillation) (HCC) 03/28/2017   Past Medical History:  Diagnosis Date   Afib (HCC)    no issues since 2018/2019   Anxiety and depression    Asthma    Benign prostatic hyperplasia without lower urinary tract symptoms 05/22/2022   Complication of anesthesia    blood pressure dropped when trying to have ACDF at surgery center- aborted surgery and re-scheduled   Emphysema of lung (HCC)    GERD (gastroesophageal reflux disease)    Hernia    right inguinal   History of bronchitis    History of kidney stones    pt reports kidney stone on scan   Hyperlipidemia    Hypertension    Hypogonadism, male 08/09/2020   Labral tear of long head of right biceps tendon 08/22/2022   Injection given Aug 22, 2022     Morton's neuroma    OA (osteoarthritis)    neck   Obstructive sleep apnea on CPAP    not using CPAP currently   Pancreatitis    Positive colorectal cancer screening using Cologuard test 06/23/2022   Tobacco user 11/26/2023   Torn meniscus    Left    Family History  Problem Relation Age of Onset   Hypertension Mother    Dementia Other    Hypertension Sister    Hypertension Brother     Past  Surgical History:  Procedure Laterality Date   ANTERIOR CERVICAL DECOMP/DISCECTOMY FUSION N/A 03/29/2023   Procedure: Anterior Cervical Decompression/Discectomy Fusion, Cervical Five-Cervical Six;  Surgeon: Debby Dorn MATSU, MD;  Location: [facility] OR;  Service: Neurosurgery;  Laterality: N/A;  3C   BONE CYST EXCISION  1991   left wrist   COLONOSCOPY     HERNIA REPAIR  04/14/2011   right inguinal   HERNIA REPAIR     left inguinal, december 2020   Social History   Occupational History   Not on file  Tobacco Use   Smoking status: Former    Current packs/day: 0.00    Average packs/day: 1.5 packs/day for 38.0 years (57.0 ttl pk-yrs)    Types: Cigarettes     Start date: 30    Quit date: 2015    Years since quitting: 11.0   Smokeless tobacco: Never  Vaping Use   Vaping status: Every Day   Substances: Nicotine  Substance and Sexual Activity   Alcohol use: Not Currently   Drug use: Yes    Comment: Delta-8 THC gummies 2-3 times/week   Sexual activity: Yes    Partners: Female     "

## 2024-05-08 ENCOUNTER — Ambulatory Visit: Admitting: Family Medicine

## 2024-05-13 ENCOUNTER — Encounter (HOSPITAL_BASED_OUTPATIENT_CLINIC_OR_DEPARTMENT_OTHER): Payer: Self-pay | Admitting: Pulmonary Disease

## 2024-05-14 ENCOUNTER — Other Ambulatory Visit (HOSPITAL_COMMUNITY): Payer: Self-pay

## 2024-05-14 NOTE — Telephone Encounter (Signed)
 Please advise on any other options for inhalers for pt

## 2024-05-15 ENCOUNTER — Encounter: Payer: Self-pay | Admitting: Orthopaedic Surgery

## 2024-05-15 NOTE — Telephone Encounter (Signed)
 Please advise which Spiriva  you would be sending in as pt is price checking

## 2024-05-20 ENCOUNTER — Encounter: Admitting: Physician Assistant

## 2024-05-28 ENCOUNTER — Encounter: Admitting: Student in an Organized Health Care Education/Training Program

## 2024-06-02 ENCOUNTER — Ambulatory Visit: Admitting: Internal Medicine

## 2024-06-13 ENCOUNTER — Ambulatory Visit: Admitting: Family Medicine
